# Patient Record
Sex: Male | Born: 1935 | Race: White | Hispanic: No | Smoking: Former smoker
Health system: Southern US, Community
[De-identification: ages and names within clinical notes are randomized; demographics above are authoritative.]

## PROBLEM LIST (undated history)

## (undated) DIAGNOSIS — M4800 Spinal stenosis, site unspecified: Secondary | ICD-10-CM

## (undated) DIAGNOSIS — K5792 Diverticulitis of intestine, part unspecified, without perforation or abscess without bleeding: Secondary | ICD-10-CM

## (undated) DIAGNOSIS — M519 Unspecified thoracic, thoracolumbar and lumbosacral intervertebral disc disorder: Secondary | ICD-10-CM

## (undated) DIAGNOSIS — M169 Osteoarthritis of hip, unspecified: Secondary | ICD-10-CM

## (undated) DIAGNOSIS — I4891 Unspecified atrial fibrillation: Secondary | ICD-10-CM

## (undated) DIAGNOSIS — L84 Corns and callosities: Secondary | ICD-10-CM

## (undated) DIAGNOSIS — M62838 Other muscle spasm: Secondary | ICD-10-CM

## (undated) DIAGNOSIS — R269 Unspecified abnormalities of gait and mobility: Secondary | ICD-10-CM

## (undated) DIAGNOSIS — N400 Enlarged prostate without lower urinary tract symptoms: Secondary | ICD-10-CM

## (undated) DIAGNOSIS — I872 Venous insufficiency (chronic) (peripheral): Secondary | ICD-10-CM

## (undated) DIAGNOSIS — M25579 Pain in unspecified ankle and joints of unspecified foot: Secondary | ICD-10-CM

## (undated) DIAGNOSIS — I509 Heart failure, unspecified: Secondary | ICD-10-CM

## (undated) DIAGNOSIS — I309 Acute pericarditis, unspecified: Secondary | ICD-10-CM

## (undated) DIAGNOSIS — I10 Essential (primary) hypertension: Secondary | ICD-10-CM

## (undated) DIAGNOSIS — H3530 Unspecified macular degeneration: Secondary | ICD-10-CM

## (undated) DIAGNOSIS — R5381 Other malaise: Secondary | ICD-10-CM

## (undated) DIAGNOSIS — I2510 Atherosclerotic heart disease of native coronary artery without angina pectoris: Secondary | ICD-10-CM

## (undated) DIAGNOSIS — D649 Anemia, unspecified: Secondary | ICD-10-CM

## (undated) DIAGNOSIS — Z5189 Encounter for other specified aftercare: Secondary | ICD-10-CM

## (undated) DIAGNOSIS — Z8673 Personal history of transient ischemic attack (TIA), and cerebral infarction without residual deficits: Secondary | ICD-10-CM

## (undated) DIAGNOSIS — I219 Acute myocardial infarction, unspecified: Secondary | ICD-10-CM

## (undated) DIAGNOSIS — J189 Pneumonia, unspecified organism: Secondary | ICD-10-CM

## (undated) DIAGNOSIS — I252 Old myocardial infarction: Secondary | ICD-10-CM

## (undated) DIAGNOSIS — I5021 Acute systolic (congestive) heart failure: Secondary | ICD-10-CM

## (undated) DIAGNOSIS — S32509A Unspecified fracture of unspecified pubis, initial encounter for closed fracture: Secondary | ICD-10-CM

## (undated) DIAGNOSIS — R0609 Other forms of dyspnea: Secondary | ICD-10-CM

## (undated) DIAGNOSIS — IMO0001 Reserved for inherently not codable concepts without codable children: Secondary | ICD-10-CM

## (undated) DIAGNOSIS — K269 Duodenal ulcer, unspecified as acute or chronic, without hemorrhage or perforation: Secondary | ICD-10-CM

## (undated) DIAGNOSIS — M1990 Unspecified osteoarthritis, unspecified site: Secondary | ICD-10-CM

## (undated) DIAGNOSIS — H259 Unspecified age-related cataract: Secondary | ICD-10-CM

## (undated) DIAGNOSIS — J449 Chronic obstructive pulmonary disease, unspecified: Secondary | ICD-10-CM

## (undated) DIAGNOSIS — K5900 Constipation, unspecified: Secondary | ICD-10-CM

## (undated) DIAGNOSIS — G609 Hereditary and idiopathic neuropathy, unspecified: Secondary | ICD-10-CM

## (undated) DIAGNOSIS — I498 Other specified cardiac arrhythmias: Secondary | ICD-10-CM

## (undated) DIAGNOSIS — I5032 Chronic diastolic (congestive) heart failure: Secondary | ICD-10-CM

## (undated) DIAGNOSIS — A0100 Typhoid fever, unspecified: Secondary | ICD-10-CM

## (undated) DIAGNOSIS — R0989 Other specified symptoms and signs involving the circulatory and respiratory systems: Secondary | ICD-10-CM

## (undated) DIAGNOSIS — Z8719 Personal history of other diseases of the digestive system: Secondary | ICD-10-CM

## (undated) HISTORY — DX: Unspecified macular degeneration: H35.30

## (undated) HISTORY — DX: Hereditary and idiopathic neuropathy, unspecified: G60.9

## (undated) HISTORY — DX: Heart failure, unspecified: I50.9

## (undated) HISTORY — DX: Other malaise: R53.81

## (undated) HISTORY — DX: Other specified symptoms and signs involving the circulatory and respiratory systems: R09.89

## (undated) HISTORY — DX: Pneumonia, unspecified organism: J18.9

## (undated) HISTORY — DX: Essential (primary) hypertension: I10

## (undated) HISTORY — PX: SPINE SURGERY: SHX786

## (undated) HISTORY — PX: APPENDECTOMY: SHX54

## (undated) HISTORY — DX: Unspecified atrial fibrillation: I48.91

## (undated) HISTORY — DX: Typhoid fever, unspecified: A01.00

## (undated) HISTORY — DX: Other specified cardiac arrhythmias: I49.8

## (undated) HISTORY — DX: Other forms of dyspnea: R06.09

## (undated) HISTORY — DX: Other muscle spasm: M62.838

## (undated) HISTORY — DX: Duodenal ulcer, unspecified as acute or chronic, without hemorrhage or perforation: K26.9

## (undated) HISTORY — DX: Acute pericarditis, unspecified: I30.9

## (undated) HISTORY — DX: Personal history of other diseases of the digestive system: Z87.19

## (undated) HISTORY — DX: Unspecified abnormalities of gait and mobility: R26.9

## (undated) HISTORY — PX: TONSILLECTOMY AND ADENOIDECTOMY: SHX28

## (undated) HISTORY — DX: Acute systolic (congestive) heart failure: I50.21

## (undated) HISTORY — DX: Unspecified fracture of unspecified pubis, initial encounter for closed fracture: S32.509A

## (undated) HISTORY — DX: Unspecified osteoarthritis, unspecified site: M19.90

## (undated) HISTORY — DX: Osteoarthritis of hip, unspecified: M16.9

## (undated) HISTORY — DX: Unspecified thoracic, thoracolumbar and lumbosacral intervertebral disc disorder: M51.9

## (undated) HISTORY — PX: CHOLECYSTECTOMY: SHX55

## (undated) HISTORY — DX: Corns and callosities: L84

## (undated) HISTORY — DX: Unspecified age-related cataract: H25.9

## (undated) HISTORY — DX: Reserved for inherently not codable concepts without codable children: IMO0001

## (undated) HISTORY — DX: Venous insufficiency (chronic) (peripheral): I87.2

## (undated) HISTORY — DX: Chronic diastolic (congestive) heart failure: I50.32

## (undated) HISTORY — DX: Anemia, unspecified: D64.9

## (undated) HISTORY — DX: Pain in unspecified ankle and joints of unspecified foot: M25.579

## (undated) HISTORY — DX: Encounter for other specified aftercare: Z51.89

## (undated) HISTORY — DX: Atherosclerotic heart disease of native coronary artery without angina pectoris: I25.10

## (undated) HISTORY — DX: Acute myocardial infarction, unspecified: I21.9

## (undated) HISTORY — DX: Constipation, unspecified: K59.00

## (undated) HISTORY — DX: Personal history of transient ischemic attack (TIA), and cerebral infarction without residual deficits: Z86.73

## (undated) HISTORY — DX: Chronic obstructive pulmonary disease, unspecified: J44.9

---

## 1974-05-20 DIAGNOSIS — I219 Acute myocardial infarction, unspecified: Secondary | ICD-10-CM

## 1974-05-20 DIAGNOSIS — I252 Old myocardial infarction: Secondary | ICD-10-CM

## 1974-05-20 HISTORY — DX: Old myocardial infarction: I25.2

## 1974-05-20 HISTORY — DX: Acute myocardial infarction, unspecified: I21.9

## 1997-10-03 ENCOUNTER — Emergency Department (HOSPITAL_COMMUNITY): Admission: EM | Admit: 1997-10-03 | Discharge: 1997-10-03 | Payer: Self-pay | Admitting: Emergency Medicine

## 1997-10-14 ENCOUNTER — Other Ambulatory Visit: Admission: RE | Admit: 1997-10-14 | Discharge: 1997-10-14 | Payer: Self-pay | Admitting: Gastroenterology

## 2000-05-20 HISTORY — PX: TOTAL HIP ARTHROPLASTY: SHX124

## 2000-06-23 ENCOUNTER — Encounter: Payer: Self-pay | Admitting: Orthopedic Surgery

## 2000-06-30 ENCOUNTER — Encounter: Payer: Self-pay | Admitting: Orthopedic Surgery

## 2000-06-30 ENCOUNTER — Inpatient Hospital Stay (HOSPITAL_COMMUNITY): Admission: RE | Admit: 2000-06-30 | Discharge: 2000-07-04 | Payer: Self-pay | Admitting: Orthopedic Surgery

## 2000-07-04 ENCOUNTER — Encounter: Payer: Self-pay | Admitting: Orthopedic Surgery

## 2001-05-20 HISTORY — PX: INGUINAL HERNIA REPAIR: SUR1180

## 2001-06-30 ENCOUNTER — Ambulatory Visit (HOSPITAL_BASED_OUTPATIENT_CLINIC_OR_DEPARTMENT_OTHER): Admission: RE | Admit: 2001-06-30 | Discharge: 2001-06-30 | Payer: Self-pay | Admitting: *Deleted

## 2001-11-02 ENCOUNTER — Encounter: Admission: RE | Admit: 2001-11-02 | Discharge: 2001-11-02 | Payer: Self-pay | Admitting: Urology

## 2001-11-02 ENCOUNTER — Encounter: Payer: Self-pay | Admitting: Urology

## 2001-11-10 ENCOUNTER — Encounter: Payer: Self-pay | Admitting: Urology

## 2001-11-10 ENCOUNTER — Ambulatory Visit (HOSPITAL_BASED_OUTPATIENT_CLINIC_OR_DEPARTMENT_OTHER): Admission: RE | Admit: 2001-11-10 | Discharge: 2001-11-10 | Payer: Self-pay | Admitting: Urology

## 2001-12-16 ENCOUNTER — Inpatient Hospital Stay (HOSPITAL_COMMUNITY): Admission: EM | Admit: 2001-12-16 | Discharge: 2001-12-19 | Payer: Self-pay | Admitting: Emergency Medicine

## 2001-12-16 ENCOUNTER — Encounter: Payer: Self-pay | Admitting: Emergency Medicine

## 2002-04-12 ENCOUNTER — Inpatient Hospital Stay (HOSPITAL_COMMUNITY): Admission: EM | Admit: 2002-04-12 | Discharge: 2002-04-17 | Payer: Self-pay | Admitting: Gastroenterology

## 2005-05-20 HISTORY — PX: VEIN SURGERY: SHX48

## 2005-09-17 ENCOUNTER — Encounter: Admission: RE | Admit: 2005-09-17 | Discharge: 2005-10-16 | Payer: Self-pay | Admitting: Cardiology

## 2005-10-17 ENCOUNTER — Encounter: Admission: RE | Admit: 2005-10-17 | Discharge: 2005-10-17 | Payer: Self-pay | Admitting: Cardiology

## 2005-10-17 ENCOUNTER — Encounter: Admission: RE | Admit: 2005-10-17 | Discharge: 2005-11-22 | Payer: Self-pay | Admitting: Cardiology

## 2006-09-02 ENCOUNTER — Encounter: Admission: RE | Admit: 2006-09-02 | Discharge: 2006-09-02 | Payer: Self-pay | Admitting: Cardiology

## 2007-06-01 ENCOUNTER — Encounter: Admission: RE | Admit: 2007-06-01 | Discharge: 2007-06-01 | Payer: Self-pay | Admitting: Sports Medicine

## 2007-06-12 ENCOUNTER — Inpatient Hospital Stay (HOSPITAL_COMMUNITY): Admission: RE | Admit: 2007-06-12 | Discharge: 2007-06-15 | Payer: Self-pay | Admitting: Orthopedic Surgery

## 2007-06-19 ENCOUNTER — Inpatient Hospital Stay (HOSPITAL_COMMUNITY): Admission: EM | Admit: 2007-06-19 | Discharge: 2007-06-22 | Payer: Self-pay | Admitting: Emergency Medicine

## 2007-08-31 ENCOUNTER — Emergency Department (HOSPITAL_COMMUNITY): Admission: EM | Admit: 2007-08-31 | Discharge: 2007-08-31 | Payer: Self-pay | Admitting: Emergency Medicine

## 2008-03-25 ENCOUNTER — Ambulatory Visit: Payer: Self-pay | Admitting: Vascular Surgery

## 2008-03-28 ENCOUNTER — Ambulatory Visit: Payer: Self-pay | Admitting: Vascular Surgery

## 2008-04-04 ENCOUNTER — Ambulatory Visit: Payer: Self-pay | Admitting: Vascular Surgery

## 2008-04-05 ENCOUNTER — Ambulatory Visit: Payer: Self-pay | Admitting: Vascular Surgery

## 2008-04-11 ENCOUNTER — Ambulatory Visit: Payer: Self-pay | Admitting: Vascular Surgery

## 2008-04-18 ENCOUNTER — Ambulatory Visit: Payer: Self-pay | Admitting: Vascular Surgery

## 2008-04-25 ENCOUNTER — Ambulatory Visit: Payer: Self-pay | Admitting: Vascular Surgery

## 2008-05-02 ENCOUNTER — Ambulatory Visit: Payer: Self-pay | Admitting: Vascular Surgery

## 2008-05-10 ENCOUNTER — Ambulatory Visit: Payer: Self-pay | Admitting: Vascular Surgery

## 2008-05-16 ENCOUNTER — Ambulatory Visit: Payer: Self-pay | Admitting: Vascular Surgery

## 2008-05-23 ENCOUNTER — Ambulatory Visit: Payer: Self-pay | Admitting: Vascular Surgery

## 2008-05-30 ENCOUNTER — Ambulatory Visit: Payer: Self-pay | Admitting: Vascular Surgery

## 2008-06-06 ENCOUNTER — Ambulatory Visit: Payer: Self-pay | Admitting: Vascular Surgery

## 2008-07-26 ENCOUNTER — Ambulatory Visit: Payer: Self-pay | Admitting: Vascular Surgery

## 2008-08-02 ENCOUNTER — Ambulatory Visit: Payer: Self-pay | Admitting: Vascular Surgery

## 2008-08-08 ENCOUNTER — Ambulatory Visit: Payer: Self-pay | Admitting: Vascular Surgery

## 2008-08-15 ENCOUNTER — Ambulatory Visit: Payer: Self-pay | Admitting: Vascular Surgery

## 2009-01-17 ENCOUNTER — Ambulatory Visit: Payer: Self-pay | Admitting: Vascular Surgery

## 2009-03-16 IMAGING — CR DG LUMBAR SPINE 2-3V
2 series · 2 of 2 positions shown · non-contrast
Comparison: none

CLINICAL DATA: LUMBAR SPINE ? 2 VIEW:

[t l-spine a.p.]
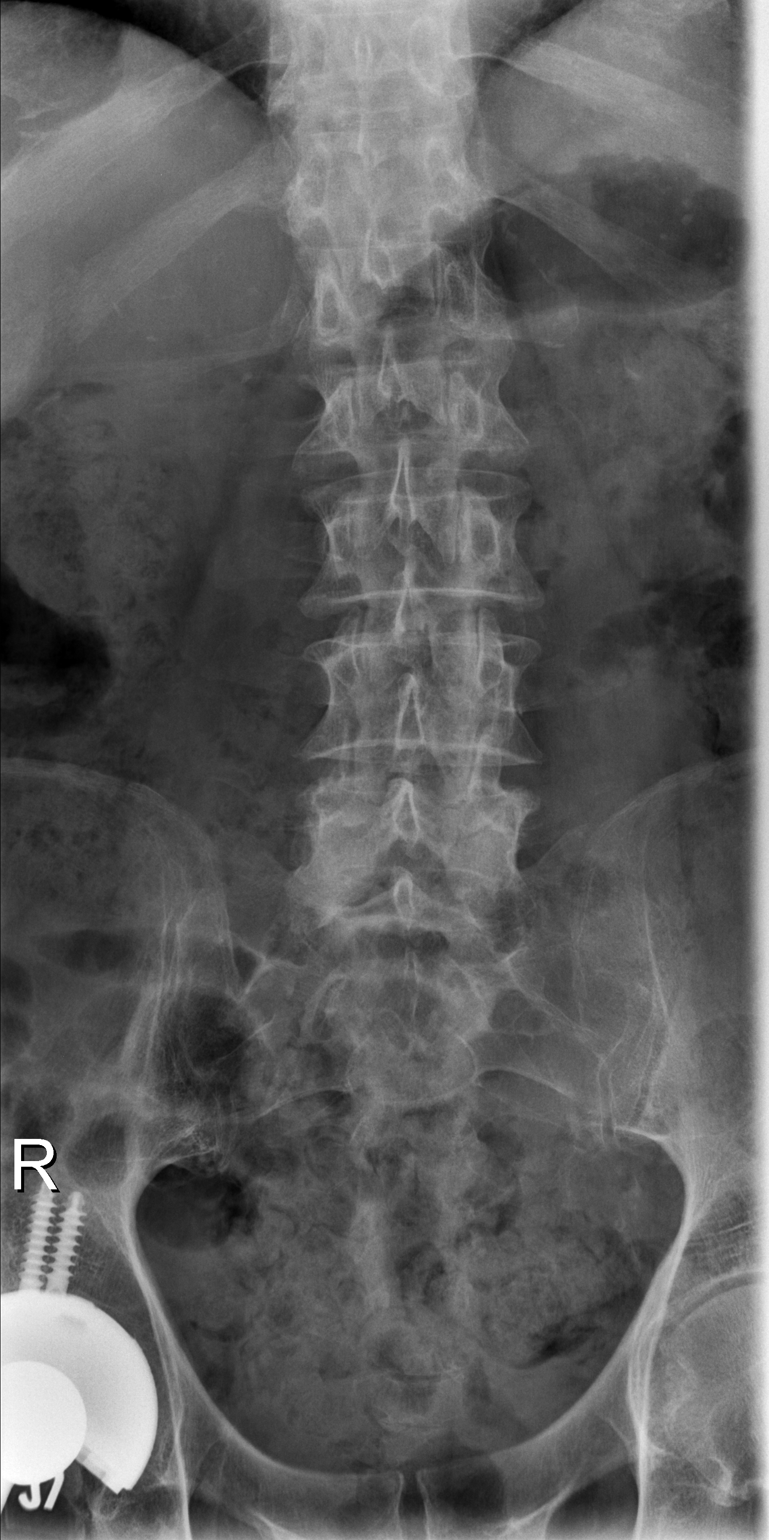

[t l-spine lat]
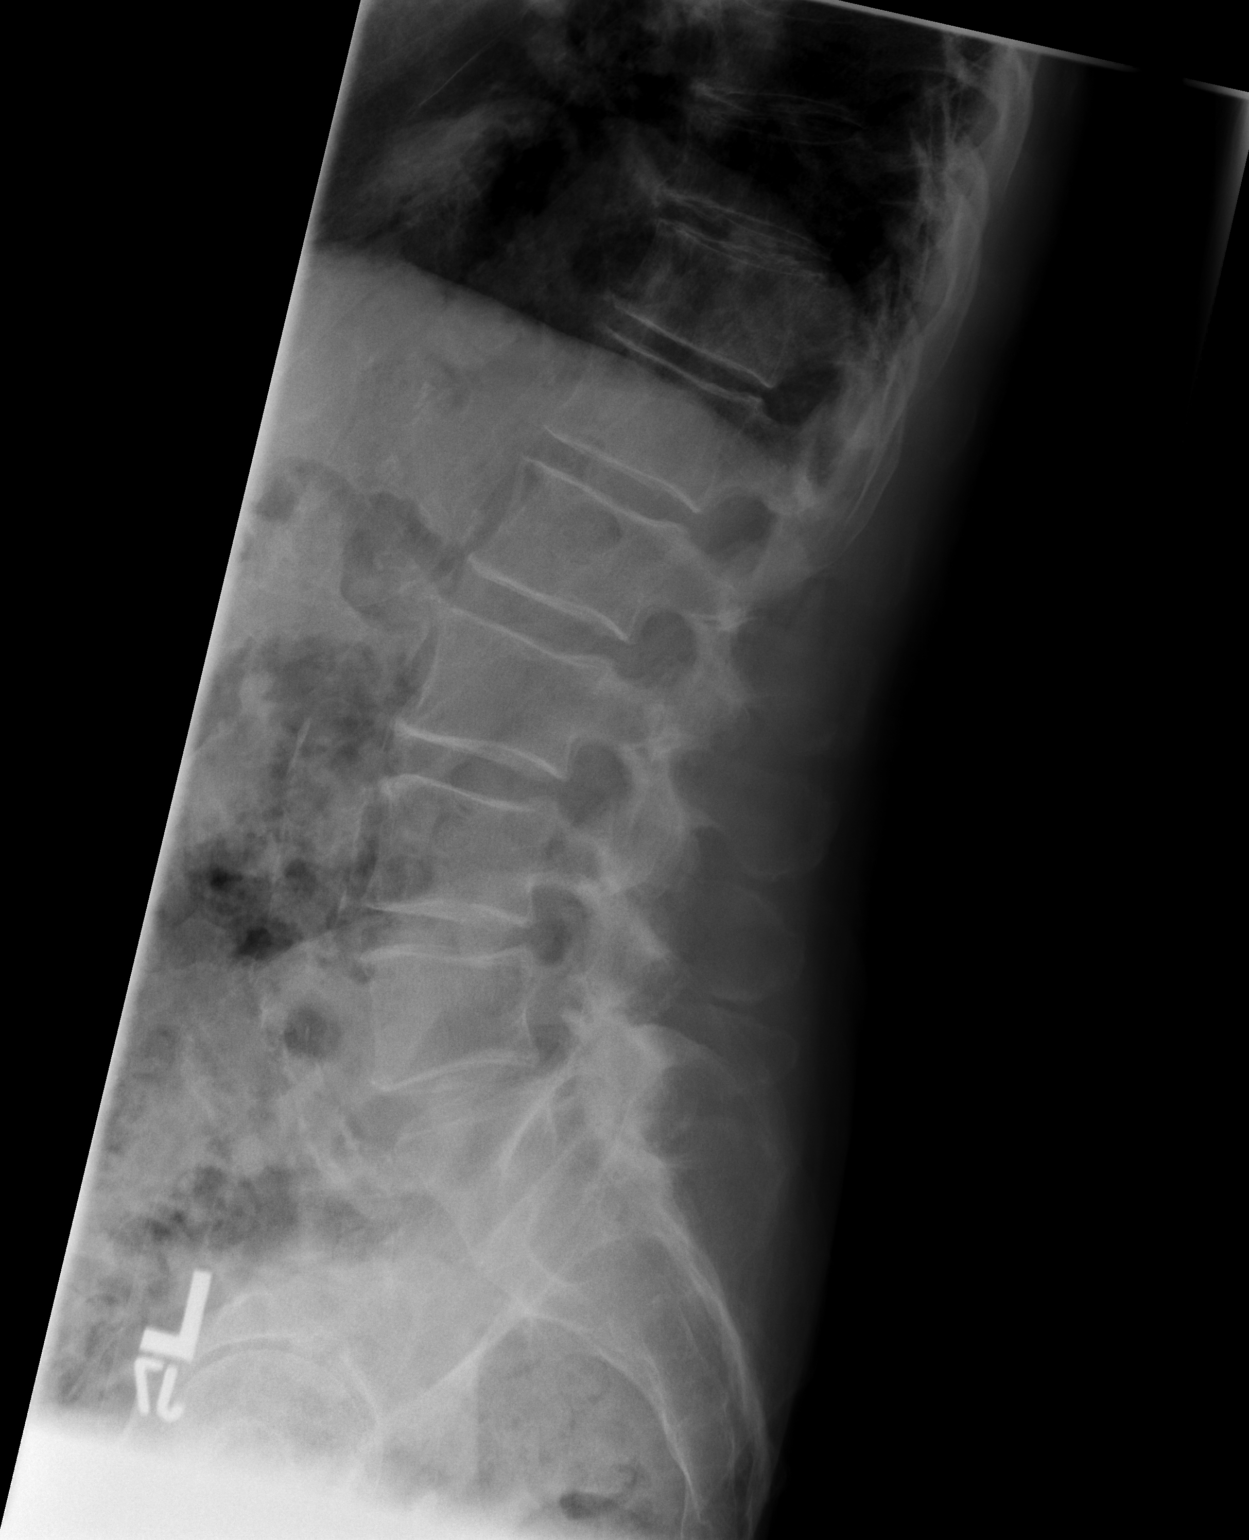

[2 of 2 positions shown; findings below may reference images not displayed]

FINDINGS: Patient has a diminutive right rib off the first lumbar type vertebral body as was seen on CT lumbar spine 06/01/07.  There is mild convex left scoliosis.   Facet degenerative change lower lumbar spine noted.
IMPRESSION: Diminutive right rib off the first lumbar type vertebral body.  Numbering scheme is the same as that employed on lumbar spine CT scan on 06/01/07.

## 2009-03-18 IMAGING — CR DG LUMBAR SPINE 2-3V
3 series · 3 of 3 positions shown · non-contrast
Comparison: Lumbar spine x-rays 06/10/2007 and lumbar CT myelogram 06/01/2007.

CLINICAL DATA: L4-L5 and L5-S1 disc herniation.

INTRAOPERATIVE LUMBAR SPINE 3 VIEW 06/12/2007:

[view not recorded (1 of 3)]
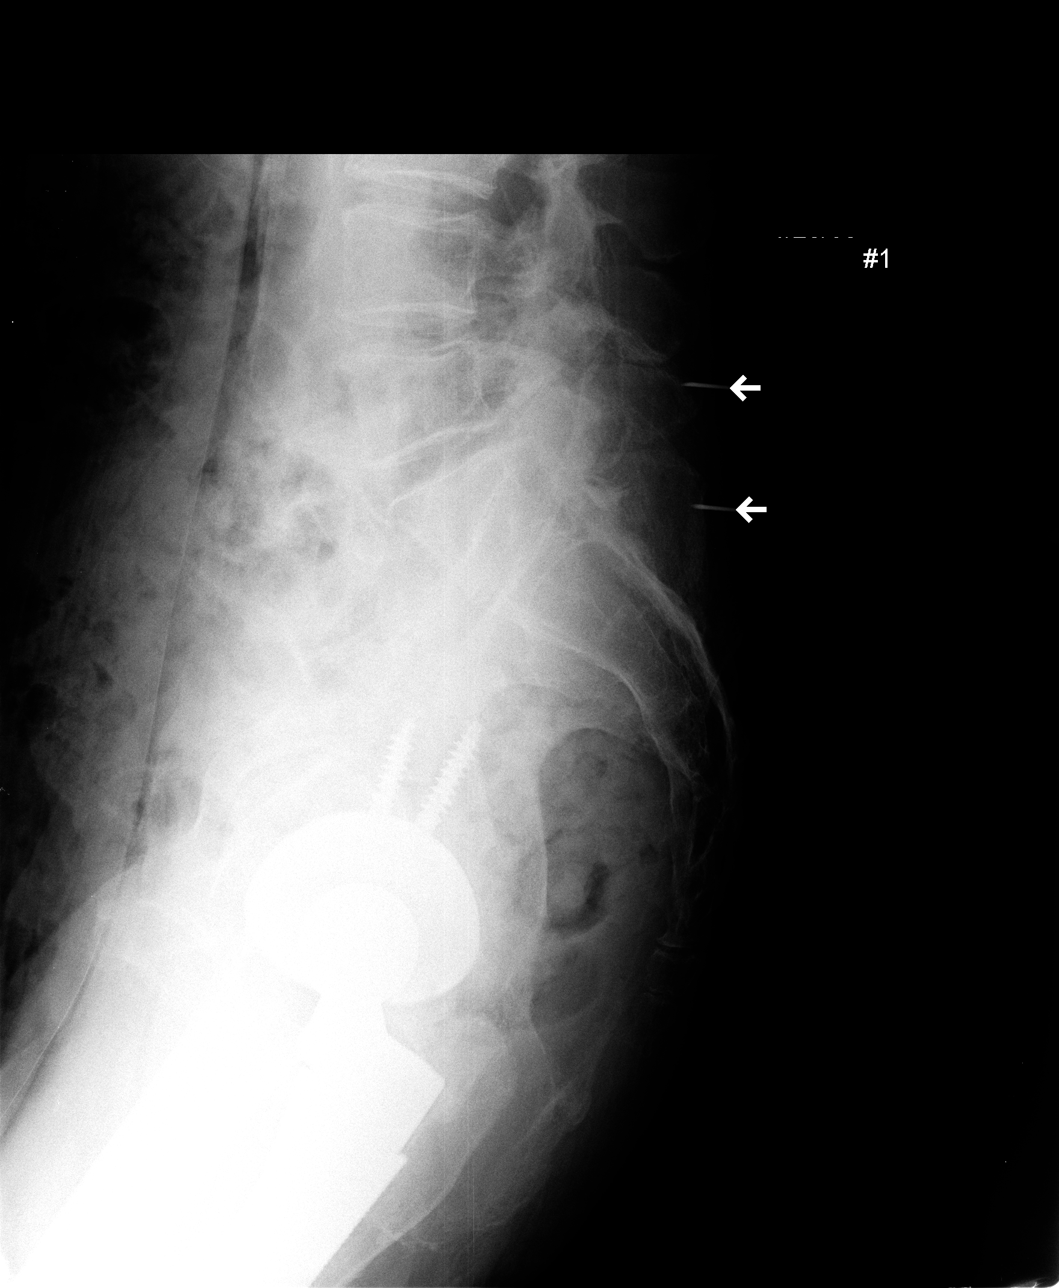

[view not recorded (2 of 3)]
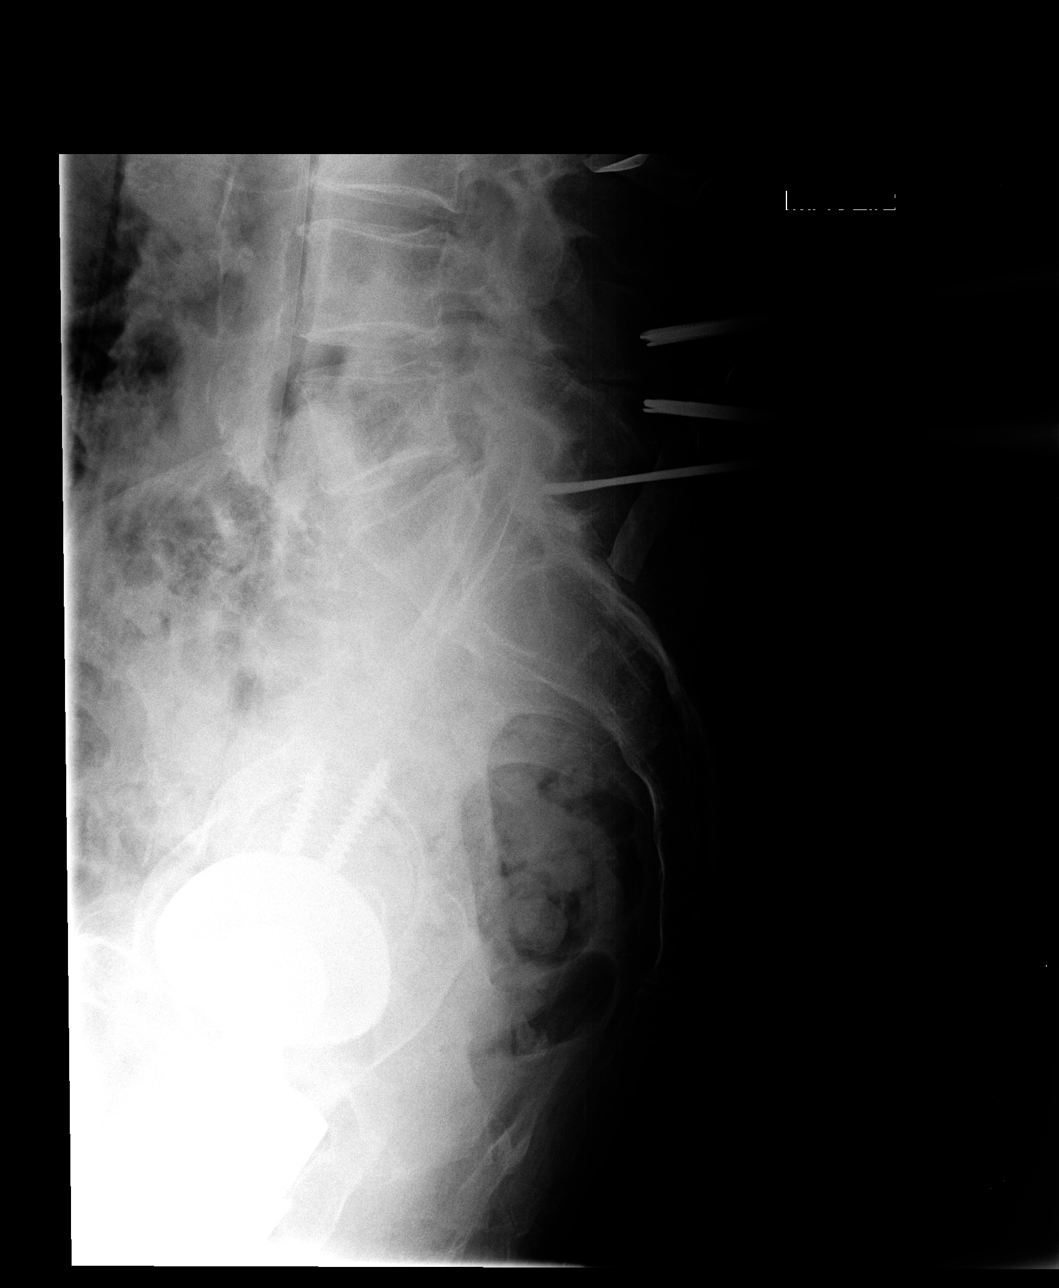

[view not recorded (3 of 3)]
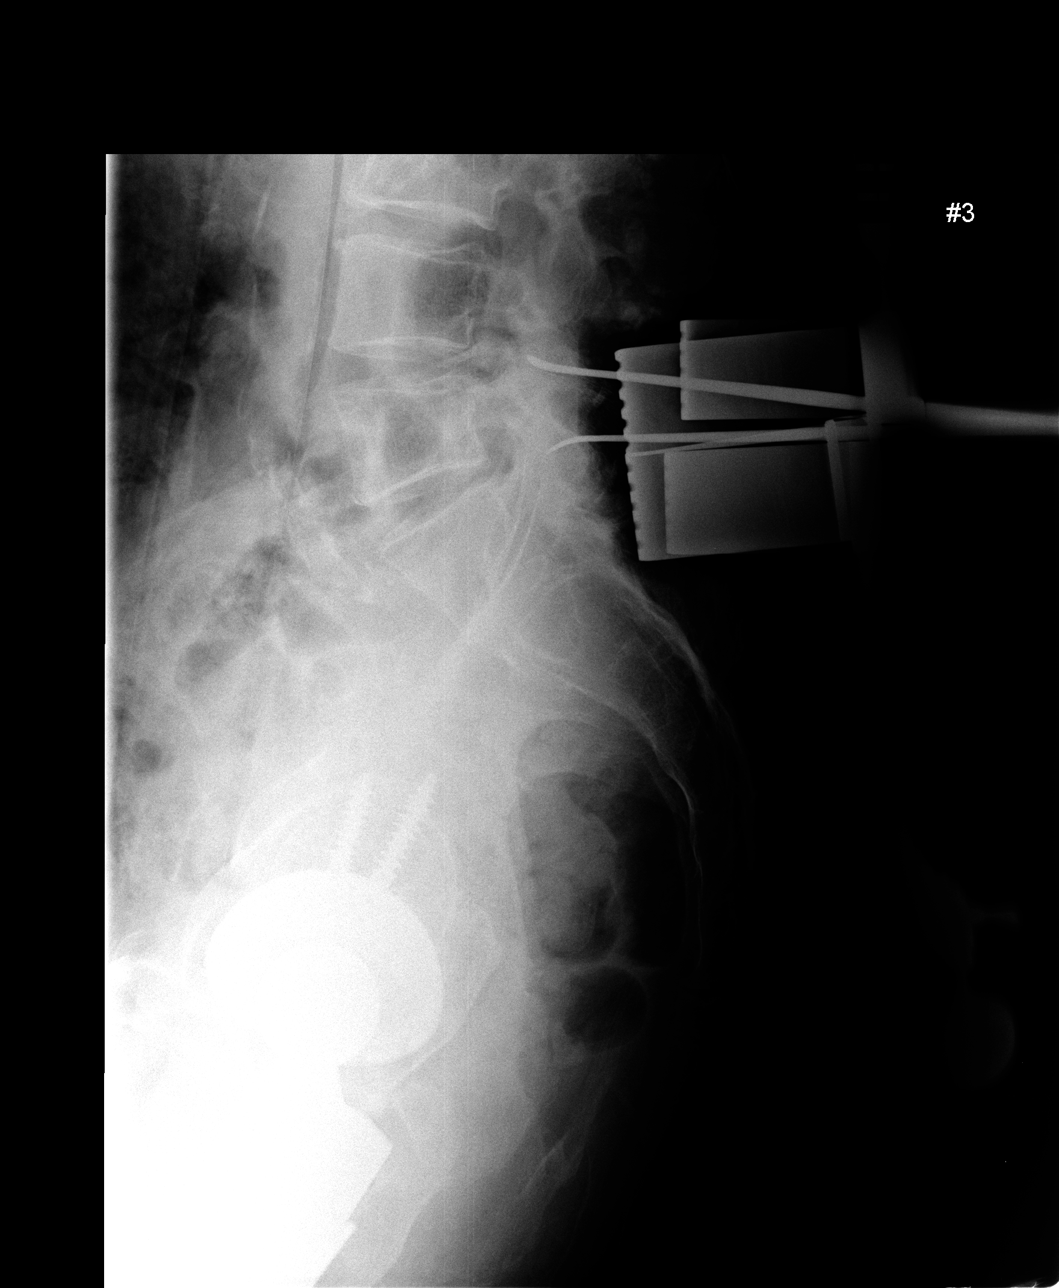

[3 of 3 positions shown; findings below may reference images not displayed]

FINDINGS: Using the same numbering scheme as on the previous examinations,
initial image obtained at 8166 hours and submitted for interpretation
post-operatively demonstrates localizer needles adjacent to the spinous
processes of L5 and transitional S1. Second image obtained at 6585 hours
demonstrates clamps on the spinous processes of L4 and L5 with a localizing
device posterior to the transitional S1 segment. Third image obtained at 2988
hours demonstrates device is directed toward the L4-L5 and L5-S1 disc spaces.
IMPRESSION: Intraoperative localization of L4-L5 and L5-S1, using the same numbering scheme
as previous examinations.

## 2009-04-03 ENCOUNTER — Ambulatory Visit: Payer: Self-pay | Admitting: Vascular Surgery

## 2009-09-13 ENCOUNTER — Encounter: Admission: RE | Admit: 2009-09-13 | Discharge: 2009-09-13 | Payer: Self-pay | Admitting: Cardiology

## 2010-02-22 ENCOUNTER — Ambulatory Visit: Payer: Self-pay | Admitting: Cardiology

## 2010-06-25 ENCOUNTER — Ambulatory Visit (INDEPENDENT_AMBULATORY_CARE_PROVIDER_SITE_OTHER): Payer: Medicare Other | Admitting: Vascular Surgery

## 2010-06-25 DIAGNOSIS — I83893 Varicose veins of bilateral lower extremities with other complications: Secondary | ICD-10-CM

## 2010-06-25 DIAGNOSIS — M79609 Pain in unspecified limb: Secondary | ICD-10-CM

## 2010-06-26 ENCOUNTER — Encounter: Payer: Self-pay | Admitting: Cardiology

## 2010-06-26 ENCOUNTER — Ambulatory Visit (INDEPENDENT_AMBULATORY_CARE_PROVIDER_SITE_OTHER): Payer: Medicare Other | Admitting: Cardiology

## 2010-06-26 DIAGNOSIS — I2510 Atherosclerotic heart disease of native coronary artery without angina pectoris: Secondary | ICD-10-CM

## 2010-06-26 DIAGNOSIS — Z79899 Other long term (current) drug therapy: Secondary | ICD-10-CM

## 2010-06-26 DIAGNOSIS — I4891 Unspecified atrial fibrillation: Secondary | ICD-10-CM

## 2010-06-26 DIAGNOSIS — I119 Hypertensive heart disease without heart failure: Secondary | ICD-10-CM

## 2010-06-29 NOTE — Assessment & Plan Note (Signed)
OFFICE VISIT  [name], [name] DOB:  [birth year]                                       06/25/2010 HYQMV#:[id number]  The patient returns today concerned about the status of his venous and arterial disease in his lower extremities.  He has been awakened at night on a few occasions with some discomfort in the very lower part of his left foot near the base of his first toe.  This usually responds to massage and getting out of bed.  He does not have severe claudication during the day and has had no recurrent stasis ulcers, bleeding from varicosities, swelling in the legs or other problems.  He states his legs feel much better than prior to his bilateral ablation procedures 2 and 4 years ago.  He has had no thrombophlebitis or DVT.  CHRONIC MEDICAL PROBLEMS: 1. Hypertension. 2. Coronary artery disease, previous myocardial infarction. 3. Degenerative joint disease, previous lumbar fusion a few years ago     followed by a pelvic fracture with fall. 4. Negative for diabetes, hyperlipidemia, COPD or stroke.  SOCIAL HISTORY:  He is widowed, has 3 children.  Has not smoked since 1976.  Drinks occasional alcohol.  FAMILY HISTORY:  Positive for coronary artery disease in his father. Who died of a myocardial infarction.  REVIEW OF SYSTEMS:  Negative for chest pain, dyspnea on exertion.  Does have arthritis in multiple joints, urinary frequency.  Denies all other systems in a complete review of systems.  PHYSICAL EXAMINATION:  Blood pressure 149/83, heart rate is 81, respirations 20.  Generally, he is an elderly male.  He is in no apparent distress, alert and oriented x3.  HEENT:  Exam normal for age. EOMs intact.  Lungs:  Clear to auscultation.  No rhonchi or wheezing. Cardiovascular:  Regular rhythm, no murmurs.  Carotid pulses 3+, no bruits.  Abdomen:  Soft, nontender, with no masses.  Musculoskeletal: Free of major deformities.  The lower extremity exam  reveals 3+ femoral, popliteal, and 1 to 2+ dorsalis pedis pulses bilaterally.  He has hyperpigmentation in both lower extremities from the midcalf distally with scaly skin but no edema and no ulcerations noted.  No varicosities are noted.  Today I ordered lower extremity venous duplex exam in both legs, which I reviewed and interpreted.  He has no DVT in either leg.  He does have severe reflux in the deep system of both legs.  GSV on the left is totally occluded, on the right is not adequately visualized and it has been previously closed.  I do not think his problems are due to venous insufficiency, although does have some severe deep reflux.  He is wearing elastic compression stockings daily.  I have reassured regarding his situation and his vasculature, and he will return to see [place] on a p.r.n. basis.    [name], M.D. Electronically Signed  JDL/MEDQ  D:  06/25/2010  T:  06/26/2010  Job:  [id number]

## 2010-07-02 NOTE — Procedures (Unsigned)
DUPLEX DEEP VENOUS EXAM - LOWER EXTREMITY  INDICATION:  Bilateral greater saphenous vein ablations, complaint of left foot cramping  HISTORY:  Edema:  Known chronic bilateral lower extremity swelling  with current use of compression hose Trauma/Surgery:  Left greater saphenous vein laser ablation on 12/23/2005, right greater saphenous vein laser ablation on 08/08/2008. Pain:  Left foot cramping at night which is alleviated with walking PE:  no Previous DVT:  no Anticoagulants: Other:  DUPLEX EXAM:               CFV   SFV   PopV  PTV    GSV               R  L  R  L  R  L  R   L  R  L Thrombosis    0  o  o  o  o  o  o   0     + Spontaneous   +  +  +  +  +  +  +   +     0 Phasic        +  +  +  +  +  +  +   +     0 Augmentation  +  +  +  +  +  +  +   +     0 Compressible  +  +  +  +  +  +  +   +     0 Competent     0  0  0  0  0  0  0   0     0  Legend:  + - yes  o - no  p - partial  D - decreased  IMPRESSION: 1. No evidence of deep venous thrombosis noted in the bilateral lower     extremities.  Severe reflux of greater than 500 milliseconds noted     throughout the bilateral deep venous systems. 2. The left greater saphenous vein appears totally occluded from the     knee to the groin area.  The right greater saphenous vein was not     adequately visualized in the thigh level. 3. There is incompetent perforator noted at the left medial distal     calf level measuring 0.47 cm.   _____________________________ [name]. Hart [place], M.D.  CH/MEDQ  D:  06/26/2010  T:  06/26/2010  Job:  [id number]

## 2010-10-02 NOTE — Assessment & Plan Note (Signed)
OFFICE VISIT   [name], [name]  DOB:  Jan 28, 1912                                       03/28/2008  WJXBJ#:[id number]   The patient returns having undergone laser ablation to his left greater  saphenous vein with stab phlebectomies on December 23, 2005.  He has  developed an ulceration on the lateral aspect of his lower leg on the  left about 2 months ago, which has not responded to conservative  treatment.  He was referred for further evaluation by Dr. Worthy Rancher.  He has not been wearing any elastic compression stockings but has been  trying local wound care.  He states that the discomfort he is having  associated with the varicosities prior to the procedure has resolved and  he is pleased with that.   EXAMINATION:  Blood pressure 180/77, heart rate 60, respirations 12.  Lower extremity exam reveals 3+ femoral, popliteal, and 2+ dorsalis  pedis pulses bilaterally.  The left leg has significant  hyperpigmentation from the mid calf distally, consistent with chronic  venous insufficiency.  There are no large bulbous varicosities noted.  He has an ulcer in the lateral aspect of the lower leg measuring about 3  x 1.5 cm which does not appear infected.  The foot is adequately  perfused.   Venous duplex exam is performed and reveals complete occlusion of the  left greater saphenous vein consistent with previous laser ablation 2  years ago.  His deep venous system is widely patent, except incompetence  in the left popliteal vein.   I do not think he has significant arterial insufficiency causing this  and suspect he continues to have some venous hypertension, although the  ablation of the left greater saphenous vein was successful with closure  of the vein.  We will treat this with Unna boot changes once a week and  I will see him in 4 to 5 weeks to see how he is progressing.   [name], M.D.  Electronically Signed   JDL/MEDQ  D:  03/28/2008  T:   03/29/2008  Job:  [id number]

## 2010-10-02 NOTE — Assessment & Plan Note (Signed)
OFFICE VISIT   TREMAIN, RUCINSKI [name]  DOB:  11/11/1911                                       05/10/2008  WJXBJ#:[id number]   The patient returns today after having weekly Unnaboot changes for his  stasis ulcer in the left lateral leg.  He was last seen by me November  23 at which time the ulcer measured 3 x 1 cm.  Today it is almost  completely healed.  There are two eschars at the superior and inferior  aspect of the previous ulcer with skin growing in-between these.  He had  no significant distal edema today.  His left leg continues well-  perfused.  I think we should continue Unnaboot for at least another 3-4  weeks to get this completely healed.  He will return to see me in 4  weeks and possibly we will then convert him to short leg elastic  stockings.   [name], M.D.  Electronically Signed   JDL/MEDQ  D:  05/10/2008  T:  05/11/2008  Job:  [id number]

## 2010-10-02 NOTE — Op Note (Signed)
NAME:  [name], RESSEL NO.:  [id number]   MEDICAL RECORD NO.:  [id number]          PATIENT TYPE:  INP   LOCATION:  0002                         FACILITY:  [facility]   PHYSICIAN:  Georges Lynch. Gioffre, M.D.DATE OF BIRTH:  [birth year]   DATE OF PROCEDURE:  06/12/2007  DATE OF DISCHARGE:                               OPERATIVE REPORT   PREOPERATIVE DIAGNOSES:  1. He is 77 years old and he has severe left leg pain.  Note his      myelogram revealed he has spinal stenosis at L4-L5.  2. He had a severe spinal stenosis at L5-S1.  3. Had a large herniated disk at L5-S1 on the left.   POSTOPERATIVE DIAGNOSES:  1. He is 77 years old and he has severe left leg pain.  Note his      myelogram revealed he has spinal stenosis at L4-L5.  2. He had a severe spinal stenosis at L5-S1.  3. Had a large herniated disk at L5-S1 on the left.   OPERATION:  1. Complete decompressive lumbar laminectomy at L4-L5 with      foraminotomies at both sides.  2. Complete decompressive lumbar laminectomy at L5-S1 with      foraminotomies bilaterally.  3. Microdiskectomy at L5-S1 on the left.   SURGEON:  Georges Lynch. Darrelyn Hillock, M.D.   ASSISTANT:  Marlowe Kays, M.D.   PROCEDURE IN DETAIL:  Under general anesthesia with the patient on a  Wilson frame, a routine orthopedic prep and drape of his back was  carried out.  He had 1 gram of IV Ancef preop.  At this time two needles  were placed in the back for localization purposes.  X-ray was taken.  At  this time an incision was made over the posterior aspect of the lumbar  spine, bleeders identified and cauterized.  The muscle was stripped from  the lamina and spinous processes bilaterally at both levels.  Another x-  ray was taken to verify our position.  Following that two [facility]  retractors were inserted.  We then went down and did a central  decompressive lumbar laminectomy at L4 and L5 in the usual fashion.  The  microscope was brought in to  remove the ligamentum flavum.  Note the  spinal canal was extremely tight at both levels.  We gradually and  carefully dissected the ligamentum flavum free of the dura.  We  protected the dura with cottonoids and also we utilized the microscope.  We first freed up the dura and then removed the ligamentum flavum.  We  did this at both levels bilaterally.  Once this was done we then went  down and did foraminotomies as I mentioned.  Following that we noted a  large defect impinging the left side of the dura at L5 and S1.  We  carefully did a partial facetectomy to remove that defect.  Once that  was done we identified the S1 root on the left.  We gently retracted the  root and went down and saw a large herniated disk.  We cauterized the  lateral veins.  We then went  down and made a cruciate incision in the  disk space and disk material extruded under pressure.  We then completed  the diskectomy.  I utilized a nerve hook and the Epstein curettes to go  subligamentous to make sure we had all the disk removed.  We also went  out laterally.  Multiple passes were made in the disk space for our  diskectomy.  We thoroughly irrigated out the area.  The roots now were  free.  We then loosely applied some 10 mL of FloSeal.  I utilized some  thrombin soaked Gelfoam into the soft tissue areas above the dura.   Following that the wound was closed in layers in the usual fashion.  I  did leave a small deep portion of the wound open proximally and distally  for drainage purposes.  The subcu was closed with 0 Vicryl, skin with  metal staples and a sterile Neosporin dressing was applied.  Postop he  was placed on a monitored bed for safety's sake as he is 77 years of  age.           ______________________________  Georges Lynch. Darrelyn Hillock, M.D.     RAG/MEDQ  D:  06/12/2007  T:  06/13/2007  Job:  [id number]   cc:   Cassell Clement, M.D.  Fax: [phone]   Marlowe Kays, M.D.  Fax: [phone]

## 2010-10-02 NOTE — Discharge Summary (Signed)
NAME:  [name], [name] NO.:  [id number]   MEDICAL RECORD NO.:  [id number]          PATIENT TYPE:  INP   LOCATION:  1435                         FACILITY:  [facility]   PHYSICIAN:  Georges Lynch. Gioffre, M.D.DATE OF BIRTH:  12/28/1911   DATE OF ADMISSION:  06/12/2007  DATE OF DISCHARGE:                               DISCHARGE SUMMARY   Discharge summary for [facility].  He was taken to surgery on June 12, 2007 with the diagnosis of spinal stenosis at L4-5 and L5-S1.  A  second diagnosis would be herniated disk L5-S1 on the left.  His main  problem was severe pain in his left leg.  Postop he did well.  He was  seen the night of surgery and he felt that his leg pain was completely  absent.  He was seen by the cardiologist and he was placed on the  monitor floor because of his age, and also a history of hypertension.  Postop, he did extremely well.  His dressings were changed and the wound  looked fine.  His hemoglobin remained stable.  The only abnormality, his  sodium dropped to about 127.  He was managed with IV saline and his  hydrochlorothiazide was withheld.  I saw him again this morning, on  June 15, 2007.  Dressing was changed.  Wound looked good.  He is  moving his legs well.  He is up ambulating with a walker.  It was  elected to discharge him to the skilled nursing section at [facility].  His pertinent laboratory studies while in the hospital were stable.  His  chest x-ray basically showed no active disease.  The EKG showed that he  had a first-degree AV block with premature supraventricular complexes.  He had multiple laboratory studies in the hospital as well.  His initial  white count was 7100, hemoglobin 13, hematocrit 37, platelets were 280.  The initial sodium was 133, potassium 5.2, chloride 95, glucose 102, BUN  20, creatinine 1.14.  Liver function studies were within normal limits.  His INR was 1.  Prothrombin time was 13.1.  The urinalysis was  basically  negative.  Followup studies on him showed that his sodium remained somewhat low.  The last sodium done was on June 15, 2007, was 127.  So the plan was  to withhold his hydrochlorothiazide for least 1 week.   DISCHARGE DIAGNOSIS:  1. Hypertension.  2. Severe spinal stenosis.  3. Herniated lumbar disk L4-5 on the left.   DISCHARGE MEDICATIONS:  1. Colace 1 by mouth b.i.d. p.r.n.  2. Hydrochlorothiazide 25 mg a day.  We will hold that for 1 week and      then he will go to the reduced dose of 12.5 mg until seen by the      cardiologist.  3. Norvasc 5 mg a day.  4. Sinequan 25 mg hours asleep.  5. He will be on Percocet 10/650 one every 4 hours p.r.n. for pain.  6. Robaxin 500 mg by mouth b.i.d. p.r.n. for spasms.   DISCHARGE INSTRUCTIONS:  1. He will ambulate with a walker, full weightbearing.  2. The dressing should be changed once a day.  3. No tub baths, but he may shower.  4. He will see Dr. Darrelyn Hillock in the office.  Please call to make an      appointment at [phone], and he should be seen the first week in      February, which actually should be about 2 weeks from the day of      his surgery.           ______________________________  Georges Lynch Darrelyn Hillock, M.D.     RAG/MEDQ  D:  06/15/2007  T:  06/15/2007  Job:  [id number]   cc:   Elmore Guise., M.D.  Fax: [phone]

## 2010-10-02 NOTE — Consult Note (Signed)
NAME:  [name], [name] NO.:  [id number]   MEDICAL RECORD NO.:  [id number]          PATIENT TYPE:  INP   LOCATION:  0002                         FACILITY:  [facility]   PHYSICIAN:  Lyn Records, M.D.   DATE OF BIRTH:  13-May-1912   DATE OF CONSULTATION:  DATE OF DISCHARGE:                                 CONSULTATION   REASON FOR EVALUATION:  A 77 year old patient with right bundle branch  block who just underwent general anesthesia.   CONCLUSIONS:  1. Chronic right bundle branch block on EKG.  2. Status post decompressive lumbar surgery, June 12, 2007.  3. Hypertension.  4. Osteoarthritis.   RECOMMENDATIONS:  1. Continue usual medications as taken at home.  2. Check EKG in the a.m.  3. The patient is doing well at this early stage after noncardiac      surgery.  4. We will check on the patient again in the a.m.   COMMENTS:  The patient is 77 years of age and underwent a lumbar spinal  procedure today by Dr. Darrelyn Hillock.  He had no apparent complications or  hemodynamic derangements during surgery.  In reviewing the records, the  patient has a history of right bundle branch block.  There is also a  history of hypertension.  I found no records to suggest the possibility  or the prior history of atherosclerotic heart disease.  He does have a  history of hypertension.   MEDICATIONS:  His medications include antihypertensive therapy and/or  hydrochlorothiazide 25 mg q.a.m., Norvasc 5 mg q.a.m.  He also takes  Sinequan 25 mg at bedtime and oxycodone p.r.n. for pain.   PHYSICAL EXAMINATION:  GENERAL:  On exam, the patient is somewhat  groggy, is breathing adequately.  VITAL SIGNS:  Respirations are 12, blood pressure is 128/70, heart rate  is 70.  CARDIAC EXAM:  Unremarkable.  No JVD is noted.  LUNGS:  Clear anteriorly.  EXTREMITIES:  Reveal no edema.  No other data is available or needed at  this time.   COMMENTS:  The patient is stable at this early point  after noncardiac  surgery.  We should maintain his usual therapy.  Will check an EKG in  the a.m.  Will follow the patient with you.      Lyn Records, M.D.  Electronically Signed     HWS/MEDQ  D:  06/12/2007  T:  06/12/2007  Job:  [id number]   cc:   Windy Fast A. Darrelyn Hillock, M.D.  Fax: [phone]   Cassell Clement, M.D.  Fax: [phone]

## 2010-10-02 NOTE — Procedures (Signed)
DUPLEX DEEP VENOUS EXAM - LOWER EXTREMITY   INDICATION:  Follow up right greater saphenous vein laser ablation.   HISTORY:  Edema:  No  Trauma/Surgery:  Right greater saphenous vein laser ablation on  08/08/2008.  Pain:  Right medial thigh tenderness.  PE:  No  Previous DVT:  No  Anticoagulants:  Other:   DUPLEX EXAM:                CFV   SFV   PopV  PTV    GSV                R  L  R  L  R  L  R   L  R  L  Thrombosis    0  0  0     0     0      +  Spontaneous   +  +  +     +     +      0  Phasic        +  +  +     +     +      0  Augmentation  +  +  +     +     +      0  Compressible  +  +  +     +     +      0  Competent     0     0     0   Legend:  + - yes  o - no  p - partial  D - decreased   IMPRESSION:  1. No evidence of deep venous thrombosis noted in the right lower      extremity, with reflux noted within the right common femoral,      superficial femoral and popliteal veins.  2. Totally occluded right greater saphenous vein noted from the distal      insertion site to the lateral accessory saphenous vein level.  3. Reflux is noted at the saphenofemoral junction level, with no      reflux noted extending into the lateral accessory saphenous vein.  4. Partially occluded varicose veins of the right medial calf region      noted.    _____________________________  [name], M.D.   CH/MEDQ  D:  08/15/2008  T:  08/15/2008  Job:  [id number]

## 2010-10-02 NOTE — H&P (Signed)
NAME:  Geoffrey, Ali NO.:  [id number]   MEDICAL RECORD NO.:  [id number]          PATIENT TYPE:  INP   LOCATION:  NA                           FACILITY:  [facility]   PHYSICIAN:  Georges Lynch. Gioffre, M.D.DATE OF BIRTH:  1911/07/14   DATE OF ADMISSION:  06/12/2007  DATE OF DISCHARGE:                              HISTORY & PHYSICAL   CHIEF COMPLAINT:  Severe lower back and left leg pain.   HISTORY OF PRESENT ILLNESS:  The patient is an 77 year old gentleman who  presented to Dr. Jeannetta Ellis office for evaluation lower back and left leg  pain.  Evaluation included MRI and CT myelogram.  He was shown to have  some significant spinal stenosis at L4-5 and L5-S1 with a small  herniated disk at L5-S1 on the left.  This correlates with the patient's  discomfort.  He is currently taking Vicodin with minimal short-term  improvement.  The patient states he is unable to tolerate the pain any  more and he could not go on that way anymore due to the pain.  He would  like to proceed with surgical correction.  The patient has also recently  been evaluated by Dr. Patty Sermons who has given him surgical clearance.   ALLERGIES:  NO KNOWN DRUG ALLERGIES.   CURRENT MEDICATIONS:  1. Vicodin 5 mg 1-2 tablets every 4-6 hours for pain if needed.  2. Percocet 1-2 tablets every 4-6 hours for severe pain.  3. Norvasc 5 mg a day.  4. Hydrochlorothiazide 25 mg a day.  5. Singulair 25 mg a day.  6. Multivitamins.   PAST MEDICAL HISTORY:  1. Hypertension.  2. Coronary artery disease with an MI in 1976.  3. History of ulcers and diverticulitis.  4. BPH.   PAST SURGICAL HISTORY:  1. Cholecystectomy.  2. Venous stripping.  3. Right total hip arthroplasty.  The patient denies any complications      with the previous surgical procedures.   PRIMARY CARE PHYSICIAN:  Dr. Patty Sermons.  GI specialist, Dr. Matthias Hughs.   REVIEW OF SYSTEMS:  Negative for any hematologic, neurologic, pulmonary  issues.  The  patient denies any recent chest pains, shortness of breath.  GI:  His last ulcer was many years ago.  Last case of diverticulitis 4  years ago.  He had a cholecystectomy 25 years ago without any recent  problems.  GU:  He does have BPH with no medications.  ENDOCRINE:  Denies any problems.   FAMILY MEDICAL HISTORY:  Father is deceased from an MI at age of 56.  Mother is deceased from multiple medical issues including rheumatoid  disease.   SOCIAL HISTORY:  The patient is married and lives with his wife.  He  stopped smoking 32 years previous.  He has an occasional alcoholic  beverage.   PHYSICAL EXAMINATION:  VITAL SIGNS:  Height is 5 feet 7 inches, weight  is 150 pounds, blood pressure is 128/54, pulse 74 and regular,  respirations 12, the patient is afebrile.  GENERAL:  This is an elderly-appearing gentleman, frail, ambulates with  a very shuffled gait with a very stooped over  type position.  HEENT:  Head was normocephalic.  He does have bilateral hearing aids in place.  NECK:  Supple.  No palpable lymphadenopathy.  He had good range of  motion.  CHEST:  Lung sounds were clear and equal bilaterally.  HEART:  Regular rate and rhythm, no murmurs.  ABDOMEN:  Positive bowel sounds, soft.  EXTREMITIES:  He had good range of motion of his upper extremities.  Motor strength was 4/5.  Lower extremities with good range of motion of  both hips today without any discomfort, both knees and ankles.  NEUROLOGIC:  The patient was conscious, alert and appropriate.  He did  have some decreased sensation over the lateral aspect of his left leg.  He did have a slight weakness in his left dorsiflexion of his ankle as  compared to his right.  He did have some painful range of motion of his  back.  BREAST, RECTAL AND GU:  Exams were deferred at this time.   IMPRESSION:  1. Severe spinal stenosis at L4-5, L5-S1 with herniated nucleus      pulposus at L5-S1 on the left.  2. Hypertension.  3.  Coronary artery disease with a history of an MI.  4. History of benign prostatic hypertrophy.  5. History of ulcers and diverticuli.   PLAN:  The patient has been evaluated by Dr. Patty Sermons and has been  cleared for this upcoming surgical procedure.  The patient will undergo  all other routine labs and tests prior to having a central decompressive  lumbar laminectomy at L4-5, L5-S1 and possible microdiskectomy at L5-S1  on the left.      Jamelle Rushing, P.A.    ______________________________  Georges Lynch Darrelyn Hillock, M.D.    RWK/MEDQ  D:  06/10/2007  T:  06/11/2007  Job:  [id number]

## 2010-10-02 NOTE — Assessment & Plan Note (Signed)
OFFICE VISIT   [name], [name]  DOB:  July 22, 1911                                       07/26/2008  EAVWU#:[id number]   The patient returns today with a new venous stasis ulcer on the right  lower extremity.  He has never previously had stasis ulcers on the right  leg.  He did have a stasis ulcer on the left leg which healed following  successful laser ablation and stab phlebectomies in 2007.  He was  certified to have the same procedure done on the right side but he  elected not to have it done at that time despite having symptomatic  varicosities and severe skin changes and demonstration of severe reflux  in the right greater saphenous system.  He now has been having  increasing darkness of this lower third of the right leg and skin with  stasis ulcer developing spontaneously a few weeks ago draining clear  serous fluid.  He has been wearing elastic compression stockings while  this developed and has not worn this for the last several months on the  right side.  He describes an aching throbbing discomfort in the right  leg the more he is on his feet during the day.  He has had no bleeding  and this is the first stasis ulcer he has developed.  He has edema in  the right ankle and foot as the day progresses.   Repeat venous duplex exam today in our vascular lab reveals gross reflux  at the right saphenofemoral junction and throughout the right great  saphenous vein down to the ankle level.  Deep system has some mild  reflux on the right.   I feel that we should proceed with laser ablation of his right great  saphenous vein as was previously recommended to treat the active stasis  ulcer and worsening skin conditions on the right side.  We will proceed  with precertification for this.  He will also have some stab  phlebectomies performed at the same setting.   [name], M.D.  Electronically Signed   JDL/MEDQ  D:  07/26/2008  T:  07/27/2008   Job:  [id number]

## 2010-10-02 NOTE — Consult Note (Signed)
NAME:  [name], [name] NO.:  [id number]   MEDICAL RECORD NO.:  [id number]          PATIENT TYPE:  INP   LOCATION:  1522                         FACILITY:  [facility]   PHYSICIAN:  Georges Lynch. Gioffre, M.D.DATE OF BIRTH:  08/10/1911   DATE OF CONSULTATION:  DATE OF DISCHARGE:                                 CONSULTATION   HISTORY:  He is being admitted through the ER.  I recently operated on  [name] about a week and half ago.  Did a decompressive lumbar  laminectomy.  He had severe lower extremity pain.  At that time he did  extremely well.  He was discharged back to [name] at a rehab unit.  Apparently he got up this morning on his own.  He was holding onto the  rail.  He let go and fell in the bathroom and sustained an injury to his  left hip region.  His only complaint was pain about the left lower pubic  area.   PAST MEDICAL HISTORY:  There is history of hypertension and spinal  stenosis.   PAST SURGICAL HISTORY:  He recently had a decompressive lumbar  laminectomy at two levels by me about a week and a half ago.   ALLERGIES:  None.   MEDICATIONS AT HOME:  1. Hydrochlorothiazide 12.5 mg a day.  2. Norvasc 5 mg a day.  3. Percocet 10/650 one every 4 hours p.r.n. as needed.  4. Robaxin 500 mg one by mouth b.i.d. for spasms as needed.  5. Sinequan oral, Sinequan 25 mg at bedtime.   PHYSICAL EXAM:  GENERAL:  He is alert and oriented.  He is somewhat  sedated because he had morphine and Ativan but he is able to give me a  history.  He is accompanied by his daughter.  VITAL SIGNS:  His blood pressure is 121/54 was the latest today at  12:08.  Pulse was 70, respirations 16, temperature 98.6.  HEENT:  Exam of the head and neck was negative.  The mouth was clear.  EXTREMITIES:  The upper extremities were normal.  No signs of any  fracture or dislocation.  BACK:  I examined his wound.  His wound site looks fine.  The sutures  are still in place.  In regards  to his right hip he has no pain with  right hip motion.  He had a right total hip in the past.  In his left  hip he has pain with motion over the left hip directly over the pubic  ramus.  The remaining part of the lower extremities show no fractures or  dislocations of the lower extremities.  LUNGS:  Were clear bilaterally.  No rales or rhonchi or wheezes.  ABDOMEN:  Abdomen was soft, nontender.  No signs of any masses or  extreme tenderness.  SKIN:  Normal.  HEART:  Normal sinus rhythm with no murmur.   X-RAYS:  I reviewed the x-rays of his hip.  The right total hip looks  fine.  X-ray of his pelvis shows that he has a nondisplaced fracture  inferior pubic ramus.  No signs of  any hip fractures on that side.   IMPRESSION:  1. Postoperative back surgery about 9 days ago.  He had a      decompressive lumbar laminectomy for spinal stenosis.  2. He had an inferior pubic rami fracture on the left.  3. Rule out stress fracture about the left hip but nothing shows on      the plain films today.   COMMENT:  We are going to admit him for pain control.  If his pain  persists I may need a CAT scan of the left hip.           ______________________________  Georges Lynch. Darrelyn Hillock, M.D.     RAG/MEDQ  D:  06/19/2007  T:  06/19/2007  Job:  [id number]

## 2010-10-02 NOTE — Assessment & Plan Note (Signed)
OFFICE VISIT   [name], [name]  DOB:  [birth year]                                       08/15/2008  KYHCW#:[id number]   Mr. Los underwent laser ablation of his right great saphenous vein  with some stab phlebectomies in the right calf region 1 week ago.  He  had severe venous insufficiency of the right great saphenous system and  a history of stasis ulcers.  The ulcer had healed prior to the  procedure.  He has had some mild aching and throbbing discomfort over  the course of the great saphenous vein in the thigh from the groin to  the knee, but other than that has had no distal swelling or discomfort  at the stab phlebectomy sites.  He has been wearing his long-leg elastic  stocking.   On examination today, there is minimal tenderness along the course of  the great saphenous vein as one would expect in the thigh with no distal  edema and nicely healing stab phlebectomy sites.  Venous Duplex  examination reveals no evidence of deep venous obstruction and total  occlusion of the right great saphenous vein from the entry site up to  the lateral accessory saphenous vein near the saphenofemoral junction  where there is some reflux at the junction.   I reassured him regarding these findings and he will continue to wear  short-leg stockings on a chronic basis and return to see [place] on a p.r.n.  basis.   [name], M.D.  Electronically Signed   JDL/MEDQ  D:  08/15/2008  T:  08/15/2008  Job:  [id number]

## 2010-10-02 NOTE — Procedures (Signed)
LOWER EXTREMITY VENOUS REFLUX EXAM   INDICATION:  Right leg varicose vein with ulcer in the bleeding site.   EXAM:  Using color-flow imaging and pulse Doppler spectral analysis, the  right common femoral, superficial femoral, popliteal, posterior tibial,  greater and lesser saphenous veins are evaluated.  There is evidence  suggesting deep venous insufficiency in the right lower extremity.   The right saphenofemoral junction is not competent.  The right GSV is  not competent with the caliber as described below.  The end branch of  the right greater saphenous is also not competent.   The right proximal short saphenous vein demonstrates competency.   GSV Diameter (used if found to be incompetent only)                                            Right    Left  Proximal Greater Saphenous Vein           0.35 cm  cm  Proximal-to-mid-thigh                     0.35 cm  cm  Mid thigh                                 cm       cm  Mid-distal thigh                          cm       cm  Distal thigh                              cm       cm  Knee                                      cm       cm   IMPRESSION:  1. Right greater saphenous vein reflux is identified with the caliber      ranging from 0.35 cm to 0.63, mid thigh to groin and the branch      with the caliber ranging from 0.45 to 0.52, mid thigh to ankle.  2. The right greater saphenous vein is not aneurysmal.  3. The right greater saphenous vein is not tortuous.  4. The deep venous system is not competent.  5. The right lesser saphenous vein is competent.  6. No evidence of deep venous thrombosis noted in the right leg.       ___________________________________________  Quita Skye. Hart [place], M.D.   MG/MEDQ  D:  07/26/2008  T:  07/26/2008  Job:  [id number]

## 2010-10-02 NOTE — Assessment & Plan Note (Signed)
OFFICE VISIT   [name], [name]  DOB:  Jul 22, 1911                                       04/11/2008  ZOXWR#:[id number]   Patient returns today for change of his Unna boot.   The ulceration on the lateral aspect of his left lower leg seems  slightly smaller with some ingrowth of skin in the mid portion.  Today  it measures maximally 3 cm x 1 cm.  There is some bruising anterior to  the ulcer, which the wife is concerned about, but this does not appear  to be skin breakdown or ulceration extension.  The foot does appear well  perfused with no distal edema.   We will continue weekly Unna boot changes, and I will see him back in 4-  5 weeks.   [name], M.D.  Electronically Signed   JDL/MEDQ  D:  04/11/2008  T:  04/12/2008  Job:  [id number]

## 2010-10-02 NOTE — Assessment & Plan Note (Signed)
OFFICE VISIT   AVISHAI, REIHL [name]  DOB:  1911/08/02                                       06/06/2008  EAVWU#:[id number]   Patient returns today after 10 weeks of Unna boot treatment for the  stasis ulcer in the left lateral leg.  The ulcer is completely healed  today.  The skin is thin over the healed area, but there is no  ulceration present.  He continues to have a 2+ dorsalis pedis pulse but  no distal edema.   He was advised to wear a short-leg elastic compression stocking and  watch closely for recurrence of the ulcer, elevate the leg at night, and  will return to see [place] on a p.r.n. basis.   [name], M.D.  Electronically Signed   JDL/MEDQ  D:  06/06/2008  T:  06/07/2008  Job:  [id number]

## 2010-10-02 NOTE — Discharge Summary (Signed)
NAME:  [name], [name] NO.:  [id number]   MEDICAL RECORD NO.:  [id number]          PATIENT TYPE:  INP   LOCATION:  1522                         FACILITY:  [name]   PHYSICIAN:  Georges Lynch. Gioffre, M.D.DATE OF BIRTH:  1911/06/05   DATE OF ADMISSION:  06/19/2007  DATE OF DISCHARGE:  06/22/2007                               DISCHARGE SUMMARY   He was recently operated on by me for a severe spinal stenosis.  He went  home.  He was doing well.  In fact, he went back to [facility] doing  well.  Basically, he fell in the bathroom there and sustained a fracture  of his pubic ramus on the left.  He was admitted for pain control.  He  was seen daily.  On June 20, 2007, he was still in quite a bit of  pain.  On June 21, 2007, a CT scan of his hip was ordered that showed  a fracture of the pubic ramus, and he had no significant fracture about  the hip.  He felt a little better today on rounds on June 22, 2007.  He was stable.  Wound site looked excellent.  So, our plan now is to  discharge him back to [facility].  His vital signs remained stable in  the hospital.   His laboratory work:  His hemoglobin on June 21, 2007 was 8.9,  hematocrit 25.8.  Sodium 133, potassium 4.1, chloride 101, BUN 19,  creatinine 0.96.  As far as his vital signs, he remained afebrile with  blood pressure of 128/50, pulse 62 and regular, respirations 18.  X-ray  findings I mentioned above.   Discharge date is today on June 22, 2007 back to [facility].   FOLLOW-UP CARE:  He should have his sutures out Thursday in the nursing  facility for his back.  I should see him in the office in about 2 weeks.  There is no other real significant problems here.   STATUS:  He will be able to be up with a walker, partial weight as  tolerated.           ______________________________  Georges Lynch. Darrelyn Hillock, M.D.     RAG/MEDQ  D:  06/22/2007  T:  06/22/2007  Job:  [id number]

## 2010-10-02 NOTE — Procedures (Signed)
DUPLEX DEEP VENOUS EXAM - LOWER EXTREMITY   INDICATION:  Left calf ulcer.   HISTORY:  Edema:  Left calf edema/ulcer.  Trauma/Surgery:  Left greater saphenous vein laser ablation in August of  2007.  Pain:  Left calf pain and redness.  PE:  No.  Previous DVT:  No.  Anticoagulants:  No.  Other:   DUPLEX EXAM:                CFV   SFV   PopV  PTV    GSV                R  L  R  L  R  L  R   L  R  L  Thrombosis    0  0     0     0      0  Spontaneous   +  +     +     +      +  Phasic        +  +     +     +      +  Augmentation  +  +     +     +      +  Compressible  +  +     +     +      +  Competent     0  +     +     0      +   Legend:  + - yes  o - no  p - partial  D - decreased   IMPRESSION:  1. No evidence of left leg deep vein thrombus.  2. The left greater saphenous vein is occluded.  3. The left popliteal vein is incompetent.   SECONDARY FINDINGS:  There appears to be a large lymph node in the left  groin.  There appears to be atherosclerosis in the left superficial  femoral artery.    _____________________________  [name], M.D.   MC/MEDQ  D:  03/25/2008  T:  03/25/2008  Job:  [id number]

## 2010-10-05 NOTE — Op Note (Signed)
NAME:  [name], [name]                    ACCOUNT NO.:  [id number]   MEDICAL RECORD NO.:  [id number]                   PATIENT TYPE:  INP   LOCATION:  3309                                 FACILITY:  [facility]   PHYSICIAN:  Florencia Reasons, M.D.             DATE OF BIRTH:  07/10/1911   DATE OF PROCEDURE:  12/16/2001  DATE OF DISCHARGE:                                 OPERATIVE REPORT   PROCEDURE PERFORMED:  Upper endoscopy.   ENDOSCOPIST:  Florencia Reasons, M.D.   INDICATIONS FOR PROCEDURE:  The patient is an 77 year old gentleman with  hematochezia of approximately 36 hours duration and current hemoglobin of  8.6 with essentially normal BUN and stable vital signs, and a prior history  of a diverticular bleed four years ago.  This is presumed to be a  diverticular bleed as of the present time but the patient had been on a  daily baby aspirin prior to admission and has a remote prior history of  peptic ulcer disease, so endoscopic confirmation of the absence of an upper  tract source of bleeding was felt to be clinically appropriate.   FINDINGS:  Essentially normal exam.   DESCRIPTION OF PROCEDURE:  The nature, purpose and risks of the procedure  were familiar to the patient from prior examination and he provided written  consent.  The Olympus small caliber adult video endoscope was passed under  direct vision following topical pharyngeal anesthesia with Cetacaine spray  and intravenous sedation with Versed 2 mg IV.  The esophagus was readily  entered.  The vocal cords were not well seen.   The esophagus was endoscopically normal.  There was no evidence of reflux  esophagitis, Barrett's esophagus, varices, infection, neoplasia or any  Mallory-Weiss tear.  There was a well-defined Schatzki's ring at the  squamocolumnar junction and below this was a  2 cm  hiatal hernia.  The stomach contained no blood or coffee ground  material and was essentially empty with just some minimal  clear residual  which was suctioned up.  No gastritis, erosions, ulcers, polyps or masses  were observed including a retroflex view of the proximal stomach which  showed just the small  hiatal hernia from its inferior perspective.   The pylorus, duodenal bulb and second duodenum all looked normal.  The  duodenum and the stomach were reinspected prior to removal of the scope and  again no significant abnormalities were identified.   The scope was then removed from the patient.  The patient tolerated the  procedure well and there were no apparent complications. No biopsies were  obtained.   IMPRESSION:  1. No source of bleeding endoscopically evident.  No blood or coffee ground     material present.  2.     Small  hiatal hernia with esophageal ring.  3. No evidence of aspirin induced gastropathy.   PLAN:  The patient will be treated as for a diverticular bleed  with clinical  monitoring and supportive care.                                                 Florencia Reasons, M.D.    RVB/MEDQ  D:  12/16/2001  T:  12/22/2001  Job:  [id number]   cc:   Thomas A. Patty Sermons, M.D.

## 2010-10-05 NOTE — H&P (Signed)
NAME:  [name], [name]                    ACCOUNT NO.:  [id number]   MEDICAL RECORD NO.:  [id number]                   PATIENT TYPE:  INP   LOCATION:  2312                                 FACILITY:  [facility]   PHYSICIAN:  Bernette Redbird, M.D.                DATE OF BIRTH:  1912-04-10   DATE OF ADMISSION:  04/12/2002  DATE OF DISCHARGE:                                HISTORY & PHYSICAL   REASON FOR ADMISSION:  This 77 year old gentleman is being admitted to the  hospital because of GI bleeding.   HISTORY:  The patient was in his usual state of health until this morning,  when he passed a rather explosive bowel movement of dark and red blood,  followed by another one at 1 o'clock this afternoon, associated with some  degree of weakness but no dizziness and no abdominal pain.   He has a past history of diverticular bleeding on two occasions,  specifically May of 1999, when he received two units of packed cells, and  then again in July of this year, when he received one units of packed cells.  From July up until now, he had been free of any evidence of any bowel habit  irregularity or GI bleeding and a followup hemoglobin in September of this  year showed a hemoglobin of 14.8.   The patient is known to have pancolonic diverticulosis, based on colonoscopy  done approximately five years ago.  He has not had a repeat lower GI study  since that time.   Note that the patient is on a daily baby aspirin; upper endoscopy at the  time of his last admission was negative (he was also on aspirin at that  time).   ALLERGIES:  No known allergies.   OUTPATIENT MEDICATIONS:  1. Sinequan 25 mg each evening.  2. Daily baby aspirin.  3. Occasional quinine.  4. Daily vitamins.   PAST SURGICAL HISTORY:  Remote open cholecystectomy may years ago; total hip  replacement, February of 2002; right inguinal hernia repair, February 2003.   PAST MEDICAL HISTORY:  Medical illnesses:  1. Remote  history of MI in 1976 without subsequent recurrence or any recent     anginal symptoms.  2. Prior history of diverticular bleeding in 1999 and July of 2003 as     described above.  3. Remote history of peptic ulcer disease.  4. Chronic microhematuria.   The patient has no known history of hypertension, diabetes or COPD.   HABITS:  The patient stopped smoking about 30 years ago after smoking  heavily for 40 years.  He has two mixed drinks a day.   FAMILY HISTORY:  Originally, the patient was thought to have a family  history of colon cancer in one of his sisters, but on further questioning,  it sounds as though this was sort of intra-abdominal malignancy or colon  cancer.   SOCIAL HISTORY:  The patient still works as  a Ambulance person.  He lives alone.   REVIEW OF SYSTEMS:  Per HPI.  Mild arthritis in his middle finger.  Recent  rash treated with prednisone with prompt response about six weeks ago, cause  never determined.  No other GI symptoms.  No chest pain or trouble  breathing.  No chronic diarrhea.   PHYSICAL EXAMINATION:  GENERAL:  The patient looks perhaps slightly pale but  is certainly in no evident distress.  His weight is 162 is essentially  unchanged from when he was last seen in the office two months ago.  VITAL SIGNS:  Blood pressure is 180/84 with pulse of 72, supine, and 100/60  with pulse of 80, standing.  His heart rate went from 72 to 80 during that  maneuver.  HEENT:  The gaze is conjugate.  The sclerae are anicteric.  The oropharynx  is benign.  CHEST:  Chest clear to auscultation.  HEART:  No gallops, rubs, murmurs, clicks or arrhythmias appreciated.  ABDOMEN:  Active bowel sounds.  Well-healed cholecystectomy scar.  No  organomegaly, guarding, masses or tenderness.  RECTAL:  Rectal shows massive prostatic enlargement and dark cherry-red  liquid blood as well as some darker clotted or coalesced blood material  which is almost black in  color.  NEUROLOGIC:  No obvious cranial nerves or focal motor deficits.   LABORATORY DATA:  Labs pending at this time.  They will be obtained after he  goes to the hospital from the office, where he is being examined.   IMPRESSION:  The overall picture is compatible with a recurrent diverticular  bleed.  The appearance of the blood by rectal exam plus the absence of  significant orthostatic symptomatology suggest that this is indeed again a  lower tract bleed as opposed as to an upper gastrointestinal bleed with  rapid transit.   PLAN:  1. Admission to the hospital, preferably a telemetry bed.  2. Obtain baseline blood work.  If his BUN is substantially elevated, may     need to consider endoscopic evaluation to rule out an upper trace bleed     (doubt).  3. Monitor CBC and vital signs.  Transfuse for hemoglobin less than     approximately 8 to 8.5.  4. Surgical consultation if the patient becomes significantly unstable or     has persistent bleeding requiring ongoing transfusion.                                               Bernette Redbird, M.D.    RB/MEDQ  D:  04/12/2002  T:  04/12/2002  Job:  [id number]

## 2010-10-05 NOTE — H&P (Signed)
NAME:  [name], [name]                    ACCOUNT NO.:  [id number]   MEDICAL RECORD NO.:  [id number]                   PATIENT TYPE:  INP   LOCATION:  5114                                 FACILITY:  [facility]   PHYSICIAN:  Florencia Reasons, M.D.             DATE OF BIRTH:  Aug 09, 1911   DATE OF ADMISSION:  12/16/2001  DATE OF DISCHARGE:  12/19/2001                                HISTORY & PHYSICAL   REASON FOR ADMISSION:  This 77 year old gentleman is being admitted to the  hospital because of GI bleeding.   HISTORY OF PRESENT ILLNESS:  The patient was in his usual state of health  until yesterday morning when he was on his way to a meeting and had urgency  of defecation and diarrhea.  He subsequently had urge fecal incontinence of  blood and one further bloody bowel movement later in the day while out on  the golf [place].  This morning when he awakened he was very weak and  orthostatic and had to support himself to stand up, so he presented to the  emergency room at our recommendation after first contacting the office of  his primary physician, Dr. Cassell Ali.   He has had no abdominal pain or fever with this condition.  Of note, the  patient had a diverticular bleed requiring transfusion of two units of  packed cells four years ago, at which time upper endoscopy was negative  despite a remote history of ulcer disease and the fact that the patient was  taking then, and still uses, baby aspirin once daily without any GI  prophylactic therapy.  Note that he has not had any upper tract prodromal  symptoms.   ALLERGIES:  No known drug allergies.   CURRENT MEDICATIONS:  1. Sinequan 25 mg q.h.s.  2. Baby aspirin 81 mg daily.  3. Vitamin E.  4. Quinine p.r.n.   PAST SURGICAL HISTORY:  1. Remote open cholecystectomy many years ago.  2. Total hip replacement in February 2002.  3. Right inguinal hernia repair in February 2003, by Dr. Rozetta Nunnery.   PAST MEDICAL HISTORY:  1. Remote history of MI in 1976, without subsequent recurrence or any     current or recent anginal symptoms.  2. Diverticular bleeding in May 1999, requiring transfusion of two units of     packed cells.  3. Remote history of peptic ulcer disease.  4. Chronic microhematuria.  5. Negative for hypertension, diabetes or COPD.   HABITS:  The patient stopped smoking about 30 years ago after smoking  heavily for 40 years.  The patient typically has about two mixed drinks a  day.   FAMILY HISTORY:  Colon cancer in a sister diagnosed around age 33.  Mother  died of severe rheumatoid arthritis.  Father died of MI.   SOCIAL HISTORY:  The patient is married and lives with his wife, [name], who  is a patient of mine.  He  is a graduate of [employer] and is  still working as a Tour manager.  He lives at [place]  and is quite active.   REVIEW OF SYMPTOMS:  Globally negative.  Specifically, no history of  anorexia, weight loss, dysphagia, reflux symptomatology, cough, shortness of  breath, chest pain, sore throat, abdominal pain, nausea, constipation or  diarrhea prodromal to this illness.  No gross hematuria, just microhematuria  as noted above.   PHYSICAL EXAMINATION:  GENERAL:  This is a remarkably, well-preserved, 44-  year-old gentleman who looks about 20 years younger than his stated age.  VITAL SIGNS:  Blood pressure 140/50, pulse 70 at the present time.  HEENT:  Anicteric, oropharynx benign.  Moderate conjunctival pallor.  CHEST:  Clear to auscultation. No rhonchi, rales or wheezes.  HEART:  No gallops, rubs, murmurs, clicks or arrhythmias appreciated.  ABDOMEN:  Normal bowel sounds, no bruits, no organomegaly, guarding, mass or  tenderness.  RECTAL:  Not repeated, having shown bloody material when checked by the  emergency room physician assistant.   LABORATORY DATA:  White count 5000, hemoglobin 9.6 with an MCV of 96,  platelets slightly low at  123,000.  Metabolic panel pertinent for sodium  131, glucose 115, BUN 18 with creatinine of 0.9.  Total bilirubin 1.4.  Other liver chemistries normal.  Albumin 3.2.   IMPRESSION:  1. Recurrent hematochezia consistent with a recurrent diverticular bleed.  2. Posthemorrhagic anemia, moderate.  3. Thrombocytopenia, mild.  4. Prior history of diverticular bleeding.  5. Remote history of peptic ulcer disease, now with aspirin exposure.  6. Family history of colon cancer with no polyps identified on colonoscopy     four years ago.  7. History of coronary disease status post myocardial infarction in 1976.   DISCUSSION AND PLAN:  Almost certainly this is a lower tract bleed from  diverticulosis, although vascular ectasia of the lower GI tract could  present in a similar fashion.  Less likely would be an upper tract bleed  from aspirin-induced gastropathy.  The fact that BUN is in the normal range  tonight would go against an upper tract source, but it appears that most of  his bleeding occurred yesterday and it is conceivable that would we would  have a false-normal BUN today.   PLAN:  1. Endoscopy this evening to confirm the absence of an upper tract source of     blood loss which would substantially change the patient's prognosis,     management and future treatment.  2. Presuming that the endoscopy is negative, the patient will be managed     expectantly with supportive care and clinical monitoring.                                               Florencia Reasons, M.D.   RVB/MEDQ  D:  12/16/2001  T:  12/21/2001  Job:  [id number]   cc:   [name], M.D.

## 2010-10-05 NOTE — H&P (Signed)
[facility]  Patient:    [name], [name]                 MRN: [id number] Adm. Date:  06/30/00 Attending:  Ollen Gross, M.D. Dictator:   Alexzandrew L. Julien Girt, P.A.-C. CC:         Clovis Pu. Patty Sermons, M.D.   History and Physical  DATE OF BIRTH:  Jan 22, 1912  CHIEF COMPLAINT:  Right hip pain.  HISTORY OF PRESENT ILLNESS:  The patient is an 77 year old male who is kindly referred over to the care of Dr. Lequita Halt per Dr. Trellis Paganini for consideration of right total hip.  The patient has had pain over the past year which has been quite progressive and has started to interfere with his daily activities, and has decreased his active lifestyle.  The pain has been quite tolerable in the past, however, he has had increasing stiffness and more limitations on his function.  He has been unable to play golf without discomfort, and has a very difficult time getting around.  He is seen in the office, and x-rays demonstrate significant degenerative changes of the right hip with bone-on-bone deformity.  He does seem to have somewhat of a shallow acetabulum, but however, no true dysplasia.  It is felt due to the progressive nature of his pain and the findings on x-rays, that he would be a candidate for a right total hip replacement arthroplasty.  The risks and benefits of this procedure have been discussed with the patient at length, and he has elected to proceed with surgery.  ALLERGIES:  No known drug allergies.  CURRENT MEDICATIONS: 1. Aspirin 81 mg daily.  This is stopped prior to surgery. 2. Sinequan 25 mg at night.  PAST MEDICAL HISTORY: 1. Myocardial infarction in 1976. 2. History of gastrointestinal bleed in 1999. 3. Benign prostatic hypertrophy. 4. Previous history of transfusion with gastrointestinal bleed.  SOCIAL HISTORY:  The patient is widowed.  Consumes approximately 3 to 4 ounces of alcohol daily.  Prior smoking history, approximately for  40 years, however, quit 26 years ago.  He has three children.  He is retired, however, still manages some Retail banker.  He lives independent living in a cottage at [place] since 1995.  Please note, the patient will like to look into inpatient rehabilitation at [facility], or if not available, would like to look into rehabilitation at [facility].  FAMILY HISTORY:  Father deceased at age 66 with myocardial infarction.  Mother deceased at age 69 with a history of rheumatoid arthritis.  One sister deceased at age 26 with a history of stomach cancer.  REVIEW OF SYSTEMS:  GENERAL:  No fevers, chills, or night sweats.  NEUROLOGIC:  No seizures, syncope, or paralysis.  RESPIRATORY:  No shortness of breath, productive cough, or hemoptysis.  CARDIOVASCULAR:  No chest pain, angina, or orthopnea.  GASTROINTESTINAL:  No nausea, vomiting, diarrhea, or constipation. GASTROURINARY:  The patient does have some slight nocturia, one to two episodes on average.  He associates this with his benign prostatic hypertrophy.  MUSCULOSKELETAL:  Pertinent of the right hip found in the history of present illness.  PHYSICAL EXAMINATION:  VITAL SIGNS:  Pulse 60, respirations 12, blood pressure 185/79.  GENERAL:  The patient is an 77 year old white male, well-developed, well-nourished, short in stature, appears to walk with a mildly antalgic gait due to the pain.  He is alert and oriented, and cooperative.  HEENT:  Normocephalic, atraumatic.  Pupils are round and reactive.  Oropharynx  is clear.  Extraocular movements intact.  NECK:  Supple.  There is a trace bilateral bruit noted in the neck versus a referred murmur from the chest.  CHEST:  Clear to auscultation.  HEART:  Regular rate and rhythm with a grade 2/6 early systolic ejection murmur best auscultated at aortic and Erbs point.  ABDOMEN:  Soft, slightly round, nontender, bowel sounds are present.  No rebound or guarding.  RECTAL:   Not done, not pertinent to present illness.  BREASTS:  Not done, not pertinent to present illness.  GENITALIA:  Not done, not pertinent to present illness.  EXTREMITIES:  Right lower extremity:  There is approximately a 1/4 inch shortening on the right leg as compared to the left.  He has approximately 70 degrees of hip flexion, no internal rotation, only 5 degrees of external rotation.  Motor function is intact throughout the right lower extremity.  X-RAYS:  X-rays do show significant degenerative changes of the right hip with bone-on-bone deformity.  He has somewhat of a shallow acetabulum, but no true dysplasia.  IMPRESSION: 1. End-stage osteoarthritis, right hip. 2. Benign prostatic hypertrophy. 3. History of myocardial infarction in 1976. 4. History of gastrointestinal bleed in 1999. 5. History of transfusion with gastrointestinal bleed in 1999.  PLAN:  The patient will be admitted to [facility] to undergo right total hip replacement arthroplasty.  Surgery will be performed by Dr. Ollen Gross.  Dr. Cassell Clement is the patients cardiologist.  The patient has been seen by Dr. Patty Sermons and cleared for the up and coming surgery.  Dr. Patty Sermons will also be notified of the room number on admission, and will be consulted for medical assistance with this patient throughout the hospital course. DD:  06/26/00 TD:  06/27/00 Job: [id number]

## 2010-10-05 NOTE — Discharge Summary (Signed)
   NAME:  [name], [name]                    ACCOUNT NO.:  [id number]   MEDICAL RECORD NO.:  [id number]                   PATIENT TYPE:  INP   LOCATION:  4730                                 FACILITY:  [facility]   PHYSICIAN:  Bernette Redbird, M.D.                DATE OF BIRTH:  07-May-1912   DATE OF ADMISSION:  04/12/2002  DATE OF DISCHARGE:  04/17/2002                                 DISCHARGE SUMMARY   FINAL DIAGNOSES:  1. Recurrent diverticular hemorrhage.  2. Post hemorrhagic anemia.  3. Syncope secondary to #1 and #2.  4. Remote history of myocardial infarction.  5. Remote history of peptic ulcer disease.  6. Chronic microhematuria.  7. Status post cholecystectomy, hip replacement, inguinal hernia repair.   CONSULTATIONS:  None.   COMPLICATIONS:  None.   PROCEDURES:  None.   LABORATORY DATA:  Admission hemoglobin 12.4, falling to a nadir of 8.8  several days after admission but then it was 9.5 on the day of discharge.   HOSPITAL COURSE:  The patient has a prior history of diverticular hemorrhage  in May 1999 and July 2003, each time requiring transfusions.  He was doing  well until the day of admission when he had a couple of bloody bowel  movements and felt a little bit weak and drove himself to our office.  He as  evaluated, and  preparation were made for admission.  While sitting in the  exam room after his evaluation, the patient lost consciousness in the chair  presumably due to hypotensive syncope.  The ambulance was called, but he had  already regained consciousness after being placed in a supine position.  He  never lost a pulse.   He was admitted to an intensive care unit for observation but remained  stable following discharge and never required a transfusion.  It did not  appear he had any further bleeding during the course of his hospitalization,  and by the day of discharge, it was felt that he had been observed for  sufficiently long period of time that  rebleeding was highly unlikely.  Accordingly, disposition home was arranged.   DISPOSITION:  The patient is discharged home with no activity or diet  restrictions, but he was advised to avoid aspirin for the time being.  He  will continue the multivitamins he was taking previously.   He will follow up with Dr. Matthias Ali in the near future and contact us in the  event of recurrent bleeding.   CONDITION ON DISCHARGE:  Improved.                                                 Bernette Redbird, M.D.    RB/MEDQ  D:  06/13/2002  T:  06/13/2002  Job:  [id number]

## 2010-10-05 NOTE — Op Note (Signed)
[facility]  Patient:    [name], [name]                 MRN: [id number] Proc. Date: 06/30/00 Adm. Date:  [id number] Attending:  Ollen Gross V                           Operative Report  PREOPERATIVE DIAGNOSIS:  Osteoarthritis, right hip.  POSTOPERATIVE DIAGNOSIS:  Osteoarthritis, right hip.  PROCEDURE:  Right total hip arthroplasty.  SURGEON:  Dr. Lequita Halt.  ASSISTANT:  Avel Peace, P.A.-C.  ANESTHESIA:  Spinal.  ESTIMATED BLOOD LOSS:  200.  DRAINS:  Hemovac x 1.  COMPLICATIONS:  None.  CONDITION:  Stable to recovery.  BRIEF CLINICAL NOTE:  [name] is an 77 year old male with severe osteoarthritis of the right hip with symptoms refractory to non-operative management. He presents now for a total hip arthroplasty.  DESCRIPTION OF PROCEDURE:  After successful administration of spinal anesthetic, the patient was placed in the left lateral decubitus position with the right side up and held with the hip positioner. The right lower extremity was isolated from the perineum with plastic drapes and prepped and draped in the usual sterile fashion. A standard posterolateral incision was made, skin cut with a 10 blade through subcutaneous tissue to the level of the fascia lata which was incised in line with the skin incision. The short external rotators were isolated off the femur and then capsulectomy performed. The hip was then dislocated and the center of the femoral head marked. A new trial prosthesis was placed such that the center of the trial head corresponds to the center of the native femoral head. The osteotomy line is marked and then a cut made with an oscillating saw. The femoral head was removed. The femur was then retracted anteriorly and the anterior capsule removed. Acetabular exposure was then obtained.  The labrum and then the remainder of the capsule are removed. Acetabular reaming is started with a 49 coursing in  increments of 2 to a 55. A 56 mm pinnacle acetabular shell is impacted into the acetabulum and approximately 40 degrees of abduction, 20 degrees of forward flexion matching his native anteversion. The shell is then transfixed with three dome screws. The trial 28 mm 10 degree liner is then placed.  Femoral preparations initiated with the starter reamer and canal finder. The canal was irrigated and axial reaming performed up to 15.5 mm. Proximal reaming is performed as a 15D and the sleeve is machined to a large. A 15D large trial sleeve is placed and a 20 x 15 stem with a 36 plus eight neck is placed matching his native anteversion. A trial 28 plus zero head is placed, hip reduced with excellent stability, full extension, full external rotation, 70 degrees of flexion, 40 degrees adduction, 90 degrees internal rotation and 90 degrees of flexion, 70 degrees internal rotation. The soft tissue tension was appropriate. The wound was then copiously irrigated and trial is removed. The apex hole eliminator is placed into the acetabular shell. The permanent 28 mm 10 degree plus 4 liner is then placed at the high wall at the 11 oclock position. A permanent 20D large sleeve is impacted into the proximal femur and a 20 x 15 stem with a 36 plus 8 neck is impacted matching native anteversion. A permanent 28 plus zero head is placed, hip reduced with the same stability parameters. The wound was copiously irrigated with antibiotic solution and  the short external rotators reattached to the femur through drill holes. The fascia lata and fascia of the gluteus max were closed over one limb of the Hemovac drain with interrupted #1 Vicryl. The subcu was closed in two layers with interrupted #0 and then 2-0 Vicryl, subcuticular with running 4-0 monocryl. The incision was cleaned and dried and Steri-Strips and bulky sterile dressing applied. Drains hooked to suction. He was placed into a knee immobilizer, awakened  and transported to recovery in stable condition. DD:  06/30/00 TD:  07/01/00 Job: [id number]

## 2010-10-05 NOTE — Op Note (Signed)
[place]. [facility]  Patient:    [name], [name] [facility] Visit Number: [id number] MRN: [id number]          Service Type: DSU Location: [facility] Attending Physician:  Kandis Mannan Dictated by:   Donnie Coffin Samuella Cota, M.D. Proc. Date: 06/30/01 Admit Date:  06/30/2001   CC:         Thomas A. Patty Sermons, M.D.   Operative Report  PREOPERATIVE DIAGNOSIS:  Right inguinal hernia.  POSTOPERATIVE DIAGNOSIS:  Right inguinal hernia.  OPERATION:  Right inguinal herniorrhaphy with mesh.  SURGEON:  Maisie Fus B. Samuella Cota, M.D.  ANESTHESIA:  Local (0.25% Marcaine without epinephrine, 1% Xylocaine without epinephrine and sodium bicarbonate) with anesthesia monitoring.  ANESTHESIOLOGIST:  Maren Beach, M.D. and C.R.N.A.  DESCRIPTION OF PROCEDURE:   The patient was taken to the operating room and placed on the table in the supine position.  The right lower quadrant of the abdomen was prepped and draped in the sterile field.  An oblique incision was outlined with the skin marked.  Local anesthetic was then used to block the ilioinguinal nerve in the area for the incision.  It was then used to add as necessary.  Once the ilioinguinal nerve was exposed, the ilioinguinal nerve was blocked directly.  The incision was taken through the skin and subcutaneous tissue with subcutaneous bleeders being cauterized with the Bovie.  I ligated with 3-0 Vicryl.  The external oblique aponeurosis was then divided in the line of its fibers to and through the external ring.  The patient had considerable amount of scar tissue adherent to the external oblique aponeurosis.  He had a very large, orange-size, direct inguinal hernia which was quite adherent to the cord structures.  This was dissected free. The patient had some weakness at the internal ring which was actually above and lateral to the internal ring but there was no definite indirect hernia sac noted.  The large direct hernia was then  reduced and the inguinal flow was imbricated with a running suture of 0 chromic catgut.  Because of some weakness of the tissue superior and lateral to the internal ring, I also ran a 0 chromic suture to imbricate some of the tissues above and lateral to the internal ring.  There was some still sufficient room for the cord structures at the internal ring.  A piece of 3-inch x 6-inch _____ mesh was then fashioned to cover the entire inguinal floor and to extend around the cord structures superiorly and laterally.  The mesh was anchored inferiorly with a running suture of 2-0 Novofil with the first suture being placed in the pubic tubercle, the other sutures in the shelving edge of Pouparts ligament.  The mesh was anchored superiorly and medially with interrupted sutures of 0 Novofil.  A slit was made into the mesh to accommodate the cord structures. The mesh was then sutured to itself superior and lateral to the internal ring.  The mesh seemed to be lying nicely in the inguinal floor.   The cord structures were very small and there was no lipoma of the cord.  The ilioinguinal nerve had been seen and preserved.  It was then blocked with local anesthetic and placed back in its normal anatomical position, coming through the internal ring.  The external oblique aponeurosis was then reapproximated with interrupted sutures of 3-0 Vicryl.  Scarpas fascia was closed with 3-0 Vicryl and the skin was closed with a running subcuticular suture of 4-0 Vicryl.  Benzoin and half-inch Steri-Strips  were used to reinforce the skin closure.  Dry sterile dressing was applied.  The patient seemed to tolerate the procedure well and was taken to the PACU in satisfactory condition. Dictated by:   Donnie Coffin Samuella Cota, M.D. Attending Physician:  Kandis Mannan DD:  06/30/01 TD:  06/30/01 Job: [id number]

## 2010-10-05 NOTE — Discharge Summary (Signed)
NAME:  [name], [name]                    ACCOUNT NO.:  [id number]   MEDICAL RECORD NO.:  [id number]                   PATIENT TYPE:  INP   LOCATION:  5114                                 FACILITY:  [facility]   PHYSICIAN:  Florencia Reasons, M.D.             DATE OF BIRTH:  08/25/1911   DATE OF ADMISSION:  12/16/2001  DATE OF DISCHARGE:  12/19/2001                                 DISCHARGE SUMMARY   FINAL DIAGNOSES:  1. Lower gastrointestinal bleeding, presumed diverticular origin.  2. Post hemorrhagic anemia secondary to #1.  3. Prior history of diverticular bleed in 1999.  4. History of microhematuria.  5. Previous cholecystectomy.  6. Total hip replacement.  7. Inguinal hernia repair.  8. History of coronary artery disease, status post remote myocardial     infarction in 1976.  9. Thrombocytopenia and leukopenia, mild, questionable early bone marrow     dysfunction.   CONSULTATIONS:  None.   COMPLICATIONS:  None.   PROCEDURES:  1. Upper endoscopy.  2. Transfusion of 1 unit of packed red cells.   LABORATORY DATA:  Admission hemoglobin 9.6, felt to a nadir of 7.8 and  was  9.2 prior to  discharge. Following the transfusion of 1 unit of packed  cells. Admission white count 5000. Discharge white count 3800, platelet  123,000  on admission and 114,000 on discharge. Albumin 3.2. Liver  chemistries normal except for a total bilirubin of 1.4. BUN 18, creatinine  0.9.   HOSPITAL COURSE:  The patient was admitted through the emergency room after  he called our office reporting a roughly 24 hour history of bloody bowel  movements associated with mild weakness and dizziness. As noted, he had  moderate anemia at the time of admission, hemoglobin 9.6, and was admitted  to the step-down unit.   Because the patient takes a daily baby aspirin, an upper endoscopy was  performed on the evening of admission to help definitively exclude an upper  tract source  as the cause of his  bleeding, although clinically it was felt  to be more compatible with a lower GI source.   Not surprisingly, the endoscopy showed no source of bleeding. There was a  small hiatal hernia and an esophageal ring.   The patient was  then treated supportively with IV fluids and clinical  monitoring, and when his hemoglobin fell  to 7.8, a transfusion of 1 unit of  packed red cells was ordered. The patient remained  stable over the next  couple of days while in the hospital with essentially complete cessation of  his hematochezia and without any instability of vital signs. His diet  was  advanced and discharge home was felt to be clinically appropriate.   DISPOSITION:  The patient is discharged home on a regular diet with  resumption of all previous home medications except aspirin. He may engage in  activity as tolerated and is to follow up with Dr.  Buccini in the office in  approximately 1 to 2 weeks.   DISCHARGE MEDICATIONS:  1. Sinequan 25 mg q.h.s.  2. Vitamin E.  3. Quinine p.r.n.  4. Hold aspirin for the time being.   CONDITION ON DISCHARGE:  Improved.                                               Florencia Reasons, M.D.    RVB/MEDQ  D:  01/06/2002  T:  01/08/2002  Job:  [id number]   cc:   Thomas A. Patty Sermons, M.D.

## 2010-10-05 NOTE — Op Note (Signed)
[facility]  Patient:    [name], [name] [facility] Visit Number: [id number] MRN: [id number]          Service Type: NES Location: [facility] Attending Physician:  Tania Ade Dictated by:   Lucrezia Starch. Ovidio Hanger, M.D. Proc. Date: 11/10/01 Admit Date:  11/10/2001 Discharge Date: 11/10/2001                             Operative Report  PREOPERATIVE DIAGNOSIS:  Hematuria and questionable positive cytologies.  POSTOPERATIVE DIAGNOSIS:  Hematuria and questionable positive cytologies.  OPERATIVE PROCEDURE:  Cystourethroscopy, bilateral retrograde ureteral pyelograms.  SURGEON:  Lucrezia Starch. Ovidio Hanger, M.D.  ANESTHESIA:  General laryngeal airway.  ESTIMATED BLOOD LOSS:  Negligible.  TUBES:  None.  COMPLICATIONS:  None.  INDICATIONS FOR PROCEDURE:  The patient is a very nice 77 year old white male who presented with microhematuria.  Subsequently, another workup consisting of bilateral renal ultrasound and cystourethroscopy which revealed significant BPH with enlarged median bar, but no lesions were noted.  Voiding cytologies were obtained after reparative effects had occurred.  He was noted to have equivocal cytologies.  CT scan of the abdomen and pelvis were without lesions. After understanding the risks, benefits, and alternatives, he elected to proceed with cysto, bilateral retrograde ureteral pyelograms.  DESCRIPTION OF PROCEDURE:  The patient was placed in the supine position and after proper general laryngeal airway anesthesia, was placed in the dorsolithotomy position, prepped and draped with Betadine in a sterile fashion.  Cystourethroscopy was performed with a 22.5-French Olympus panendoscope utilizing the 12 and 70 degree lenses.  The bladder was carefully inspected.  He had significant trilobar hypertrophy with a median bar.  Grade 1 trabeculation was noted.  Efflux of clear urine was noted from the normally placed ureteral orifices  bilaterally which were under the median bar, and under fluoroscopic guidance, bilateral retrograde ureteral pyelogram was performed with an 8-French cone-tip catheter.  Both collecting systems were noted to be fully patent.  No filling defects were noted and they drained well following injection, and basically, a normal bilateral retrograde ureteral pyelogram.  The bladder was drained.  The panendoscope was removed.  The patient was taken to the recovery room stable.Dictated by:   Lucrezia Starch. Ovidio Hanger, M.D. Attending Physician:  Tania Ade DD:  11/10/01 TD:  11/11/01 Job: [id number]

## 2010-10-05 NOTE — Discharge Summary (Signed)
[facility]  Patient:    [name], [name]                 MRN: [id number] Adm. Date:  [id number] Disc. Date: 07/04/00 Attending:  Loanne Drilling Dictator:   Druscilla Brownie. Shela Nevin, P.A. CC:         Clovis Pu. Patty Sermons, M.D.                           Discharge Summary  PROCEDURE:  On June 30, 2000, the patient underwent right total hip             replacement arthroplasty with Avel Peace, P.A.-C assisting.  CONSULTATIONS:  None.  ADMITTING DIAGNOSES: 1. End-stage osteoarthritis, right hip. 2. Benign prostatic hypertrophy. 3. History of myocardial infarction in 1976. 4. History of gastrointestinal bleed in 1999. 5. History of transfusion with gastrointestinal bleed in 1999.  DISCHARGE DIAGNOSES: 1. End-stage osteoarthritis, right hip. 2. Benign prostatic hypertrophy. 3. History of myocardial infarction in 1976. 4. History of gastrointestinal bleed in 1999. 5. History of transfusion with gastrointestinal bleed in 1999. 6. Mild postoperative anemia.  HISTORY OF PRESENT ILLNESS:  This 77 year old male was referred to [place] for continuing and progressive pain into his right hip.  As Dr. Ladora Daniel is no longer active in surgery, Dr. Lequita Halt saw the patient.  The patient is a very active gentleman, resident of [facility], having more and more problems with his day to day activity.  He likes to play golf and cannot do that due to the pain. He has reduction in range of motion of the right hip and occasionally wakes him with night pain.  X-rays show bone and bone deformity with destruction oft he joint.  It was felt that this patient would need surgical intervention and was admitted for the above procedure.  He was cleared preoperatively by Dr. Cassell Clement, his internal medicine physician.  HOSPITAL COURSE:  [name] tolerated the surgical procedure quite well.  His positive attitude really helped him throughout his hospitalization as well as his  family support.  The Hemovac was discontinued without any difficulty. When the dressing was changed, it was found that the wound was very nicely healing well with no evidence of any drainage.  The patient did have some mild hyponatremia.  We discontinued his IVs and allowed hyponatremia to come back to a normal level.  The patients sodium levels improved minimally with this treatment.  If this continues, we will need to get Dr. Patty Sermons involved.  On the morning of discharge, the patient had ambulated quite well in the room and had fallen back against the bed.  He did not fall on the floor and he did not twist his extremity.  He had no marked increase in pain.  AP and lateral x-ray of the right hip showed no bony changes.  The patient had no increase in pain with range of motion of the right hip.  It was felt that he was quite safe to be discharged to the [facility] and arrangements were made for discharge.  I called Dr. Cassell Clement on the day of the patients discharge.  He stated that he would be more than happy to follow his Coumadin protocol.  The next PT drawn should be Monday and those will be faxed to Dr. Jenness Corner office.  LABORATORY DATA AND X-RAY FINDINGS:  Preoperative CBC was completely within normal limits, other than RDW at  14.6.  Blood chemistries preoperatively as well as postoperatively showed the hyponatremia, but otherwise essentially within normal limits.  Urinalysis was negative.  Urinary tract infection negative.  Final hemoglobin was 10.2, hematocrit 29.1.  Chest x-ray from Dr. Jenness Corner office showed no acute changes and electrocardiogram read by Dr. Patty Sermons showed sinus bradycardia, right bundle branch block (old) with no acute changes.  CONDITION ON DISCHARGE:  Improved and stable.  DISPOSITION:  The patient is transferred/discharged to [facility].  ACTIVITY:  Touchdown  weightbearing to the right lower extremity.  He is to continue with total hip protocol and precautions.  SPECIAL INSTRUCTIONS:  Cherlynn Polo may be removed two weeks after the date of surgery and Steri-Strips p.r.n.  All medications or medical questions should be directed to his family physician, Dr. Cassell Clement.  Dr. Patty Sermons has agreed to follow his Coumadin protocol which will be three weeks from the date of surgery.  The next PT draw is on Monday.  Those results are to be faxed to Dr. Jenness Corner office and he will decided on Coumadin dosage.  Meanwhile, he is to continue with 5 mg q.d.  The hyponatremia will need to be addressed by Dr. Patty Sermons at [facility], although it is extremely mild.  WOUND CARE:  Dry dressing p.r.n.  DIET:  Continue diet from hospital.  DISCHARGE MEDICATIONS:  Continue medications from hospital.  FOLLOWUP:  Return to see Dr. Lequita Halt in three weeks. DD:  07/04/00 TD:  07/04/00 Job: [id number]

## 2010-10-23 ENCOUNTER — Other Ambulatory Visit: Payer: Self-pay | Admitting: *Deleted

## 2010-10-23 DIAGNOSIS — Z79899 Other long term (current) drug therapy: Secondary | ICD-10-CM

## 2010-10-24 ENCOUNTER — Other Ambulatory Visit (INDEPENDENT_AMBULATORY_CARE_PROVIDER_SITE_OTHER): Payer: Medicare Other | Admitting: *Deleted

## 2010-10-24 ENCOUNTER — Encounter: Payer: Self-pay | Admitting: Cardiology

## 2010-10-24 ENCOUNTER — Telehealth: Payer: Self-pay | Admitting: *Deleted

## 2010-10-24 ENCOUNTER — Ambulatory Visit (INDEPENDENT_AMBULATORY_CARE_PROVIDER_SITE_OTHER): Payer: Medicare Other | Admitting: Cardiology

## 2010-10-24 DIAGNOSIS — I4891 Unspecified atrial fibrillation: Secondary | ICD-10-CM | POA: Insufficient documentation

## 2010-10-24 DIAGNOSIS — Z79899 Other long term (current) drug therapy: Secondary | ICD-10-CM

## 2010-10-24 DIAGNOSIS — I119 Hypertensive heart disease without heart failure: Secondary | ICD-10-CM | POA: Insufficient documentation

## 2010-10-24 DIAGNOSIS — I259 Chronic ischemic heart disease, unspecified: Secondary | ICD-10-CM

## 2010-10-24 LAB — BASIC METABOLIC PANEL
CO2: 30 mEq/L (ref 19–32)
Calcium: 9 mg/dL (ref 8.4–10.5)
GFR: 79.89 mL/min (ref >60.00)
Sodium: 135 mEq/L (ref 135–145)

## 2010-10-24 LAB — CBC WITH DIFFERENTIAL/PLATELET
Basophils Relative: 0.8 % (ref 0.0–3.0)
Eosinophils Relative: 1.2 % (ref 0.0–5.0)
Lymphocytes Relative: 23.3 % (ref 12.0–46.0)
MCHC: 33.9 g/dL (ref 30.0–36.0)
MCV: 101.8 fl — ABNORMAL HIGH (ref 78.0–100.0)
Monocytes Absolute: 0.4 10*3/uL (ref 0.1–1.0)
Monocytes Relative: 9.7 % (ref 3.0–12.0)
Neutro Abs: 2.6 10*3/uL (ref 1.4–7.7)
RDW: 16.3 % — ABNORMAL HIGH (ref 11.5–14.6)
WBC: 4 10*3/uL — ABNORMAL LOW (ref 4.5–10.5)

## 2010-10-24 LAB — HEPATIC FUNCTION PANEL
ALT: 24 U/L (ref 0–53)
Bilirubin, Direct: 0.3 mg/dL (ref 0.0–0.3)

## 2010-10-24 NOTE — Progress Notes (Signed)
[place] Date of Birth:  [birth year] [facility] [address]   [address] [place], [place]  [address] [phone]           Fax   [phone]  HPI: This pleasant 77 year old gentleman is seen for a scheduled followup office visit.  He has a history of atrial fibrillation.  His ventricular response is well controlled.  He's not had any symptoms of angina or exertional dyspnea or chest pain.  He's had no dizziness or syncope.  No TIA symptoms.  He has been taking a baby aspirin 3 times a week.  He is not on Coumadin because of a history of previous severe GI bleeds.  He has a history of a remote myocardial infarction in 1976.  He's had no recurrent angina.  He has a history of a left inguinal hernia.  He has seen his surgeon Dr. Ovidio Kin who felt that conservative therapy was appropriate.  Current Outpatient Prescriptions  Medication Sig Dispense Refill  . amLODipine (NORVASC) 5 MG tablet Take 5 mg by mouth daily.        Marland Kitchen aspirin 81 MG tablet Take 81 mg by mouth 3 (three) times a week.        . doxepin (SINEQUAN) 25 MG capsule Take 25 mg by mouth.        . fish oil-omega-3 fatty acids 1000 MG capsule Take by mouth daily.        . Multiple Vitamin (MULTIVITAMIN) tablet Take 1 tablet by mouth daily.        . Multiple Vitamins-Minerals (EYE VITAMINS PO) Take by mouth daily.        . nitroGLYCERIN (NITROSTAT) 0.4 MG SL tablet Place 0.4 mg under the tongue every 5 (five) minutes as needed.          No Known Allergies  Patient Active Problem List  Diagnoses  . Atrial fibrillation  . Benign hypertensive heart disease without heart failure  . Chronic ischemic heart disease    History  Smoking status  . Former Smoker  . Quit date: 05/20/1974  Smokeless tobacco  . Not on file    History  Alcohol Use No    No family history on file.  Review of Systems: The patient denies any heat or cold intolerance.  No weight gain or weight loss.  The  patient denies headaches or blurry vision.  There is no cough or sputum production.  The patient denies dizziness.  There is no hematuria or hematochezia.  The patient denies any muscle aches or arthritis.  The patient denies any rash.  The patient denies frequent falling or instability.  There is no history of depression or anxiety.  All other systems were reviewed and are negative.   Physical Exam: Filed Vitals:   10/24/10 1010  BP: 120/80  Pulse: 74  The general appearance reveals a well-developed well-nourished gentleman in no acute distress.Pupils equal and reactive.   Extraocular Movements are full.  There is no scleral icterus.  The mouth and pharynx are normal.  The neck is supple.  The carotids reveal no bruits.  The jugular venous pressure is normal.  The thyroid is not enlarged.  There is no lymphadenopathy.The chest is clear to percussion and auscultation. There are no rales or rhonchi. Expansion of the chest is symmetrical.The precordium is quiet.  The first heart sound is normal.  The second heart sound is physiologically split.  There is no murmur gallop rub or click.  There is no abnormal lift or heave. The rhythm is irregular.  The abdomen reveals a small left inguinal hernia which is easily present reducible.The pedal pulses are good.  There is no phlebitis or edema.  There is no cyanosis or clubbing.Strength is normal and symmetrical in all extremities.  There is no lateralizing weakness.  There are no sensory deficits.The skin is warm and dry.  There is no rash.    Assessment / Plan:  Continue same medication.  Recheck in 4 months for office visit and  lab work

## 2010-10-24 NOTE — Assessment & Plan Note (Signed)
The patient has a history of essential hypertension.  He is tolerating low dose amlodipine without difficulty.  He's having no dizziness or syncope.  No headaches.  He does have urinary frequency for which she sees Dr. Annabell Howells.

## 2010-10-24 NOTE — Assessment & Plan Note (Signed)
The patient has a history of atrial fibrillation with a controlled ventricular response.  He is not on Coumadin because of a history of recurrent GI bleeding.  He has been on an aspirin 3 times a week.  He has not noticed any evidence of GI bleeding.  He is not having any symptoms in regard to his atrial fibrillation.

## 2010-10-24 NOTE — Telephone Encounter (Signed)
Message copied by Lorayne Bender on Wed Oct 24, 2010  4:30 PM ------      Message from: Cassell Clement      Created: Wed Oct 24, 2010  2:43 PM       Please report that the lab work is satisfactory except that the platelet count is low so he should decrease his baby aspirin to just twice a week

## 2010-10-24 NOTE — Assessment & Plan Note (Signed)
Patient has a history of a remote myocardial infarction in 1976.  He has not had any recurrent angina pectoris.

## 2010-10-24 NOTE — Progress Notes (Signed)
Quick Note:  Please report that the lab work is satisfactory except that the platelet count is low so he should decrease his baby aspirin to just twice a week ______

## 2010-10-24 NOTE — Telephone Encounter (Signed)
Lm w/ lab results. Advised to call back,

## 2010-11-19 ENCOUNTER — Other Ambulatory Visit: Payer: Self-pay | Admitting: *Deleted

## 2010-11-19 DIAGNOSIS — I119 Hypertensive heart disease without heart failure: Secondary | ICD-10-CM

## 2010-11-19 MED ORDER — AMLODIPINE BESYLATE 5 MG PO TABS: 5.0000 mg | ORAL_TABLET | Freq: Every day | ORAL | Status: DC

## 2010-11-20 ENCOUNTER — Telehealth: Payer: Self-pay | Admitting: Cardiology

## 2010-11-20 NOTE — Telephone Encounter (Signed)
[place]  Came [place] last Thurs.   Coughing last night, concerned it will turn to pneumonia.  Has been seen by the nurses at [facility], they are not in today.  Still not feeling well.  Thinks he needs a xray of lungs.  Please call.

## 2010-11-20 NOTE — Telephone Encounter (Signed)
Will not rx zpak secondary to potential interaction with doxepin.  Will just use mucinex and advised to go to urgent care if no better

## 2010-11-20 NOTE — Telephone Encounter (Signed)
Received call from [place] at [facility] regarding patient.  Vitals within normal limits, including temp of 96.  Lungs sounded clear, no coughing with deep breaths.  Does have colored sputum at times. Will rx zpak and plain mucinex.  Advised to go to urgent care/emergency department if worse or develops a fever.  Patient to call back if no better

## 2010-11-22 ENCOUNTER — Telehealth: Payer: Self-pay | Admitting: Cardiology

## 2010-11-22 NOTE — Telephone Encounter (Signed)
Patient called earlier stating he was still feeling weak.  Advised he needed to go to urgent care if no better, refusing.  Did go to office today.  Denied any fever, just feels weak.  Just received call from John Sevier at [facility].  Vital sign within normal limits, no temp, what she hears in lungs almost sounds like bone on bone.  States she will have him reposition and listen again.  Denies any edema.  Advised her that he needs to go to urgent care and be checked.  She stated she would try to get him to go there

## 2010-12-10 ENCOUNTER — Telehealth: Payer: Self-pay | Admitting: Cardiology

## 2010-12-10 NOTE — Telephone Encounter (Signed)
Spoke with patient and he had not had a bowel movement in 4 days and had only taken two docusate sodium on Saturday.

## 2010-12-10 NOTE — Telephone Encounter (Signed)
Patient has called again. Please call back. Has taken laxatives and suppositories and they haven't worked.

## 2010-12-10 NOTE — Telephone Encounter (Signed)
He called with a constipation issue and wanted some advice about how to proceed. Please call back. I have pulled his chart.

## 2010-12-10 NOTE — Telephone Encounter (Signed)
Advised to try Miralax and increase water.

## 2010-12-16 NOTE — Telephone Encounter (Signed)
Agree with advice given

## 2010-12-17 NOTE — Telephone Encounter (Signed)
Spoke to patient he is continuing the Miralax as recommend by Dr Patty Sermons and will follow with Dr Matthias Hughs if response to Miralax inadequate.

## 2010-12-21 ENCOUNTER — Telehealth: Payer: Self-pay | Admitting: Cardiology

## 2010-12-21 NOTE — Telephone Encounter (Signed)
Spoke w/pt and daughter. Mr. Hammerschmidt has been taking Mira lax for 14 days and has had very little results. Thinks he has a blockage. Has been drinking a lot of fluids but has not taken any other laxatives. Per Dr. Patty Sermons needs to go to urgent care. Spoke w/ daughter and advised for him to go to [facility] urgent care and they can do abd xray to see if he does have a blockage or can go to ER. He lives at [facility]. She will have someone from there take him to urgent care. Advised that he probably does need to see Dr. Matthias Hughs but needs to get this resolved first. Can call us back and we can make referral. Pt states it has been 5-6 yrs since he was seen. Has hx of diverticulosis.

## 2010-12-21 NOTE — Telephone Encounter (Signed)
Daughter states father is on Mucelex and is having constipation that is not clearing up, daughter thinks he has a blockage and has been ongoing, pt's daughter states the pt would like to be seen today, pt is 77y/o with all his faculties and is having anxiety due to symptoms,  Please call pt's daughter back [phone] Cell #,  [phone] Home #

## 2010-12-21 NOTE — Telephone Encounter (Signed)
Pt states murilax is not working and not having bowel movements, pt would like to see Dr. Patty Sermons today or have him referred to Dr. Lucienne Capers, a gastro doctor, please call pt regarding this

## 2011-01-01 ENCOUNTER — Ambulatory Visit
Admission: RE | Admit: 2011-01-01 | Discharge: 2011-01-01 | Disposition: A | Discharge status: Home / Self Care | Payer: Medicare Other | Source: Ambulatory Visit | Attending: Gastroenterology | Admitting: Gastroenterology

## 2011-01-01 ENCOUNTER — Other Ambulatory Visit: Payer: Self-pay | Admitting: Gastroenterology

## 2011-01-14 ENCOUNTER — Ambulatory Visit (INDEPENDENT_AMBULATORY_CARE_PROVIDER_SITE_OTHER): Payer: Medicare Other | Admitting: *Deleted

## 2011-01-14 DIAGNOSIS — M79609 Pain in unspecified limb: Secondary | ICD-10-CM

## 2011-01-14 DIAGNOSIS — I83893 Varicose veins of bilateral lower extremities with other complications: Secondary | ICD-10-CM

## 2011-01-15 NOTE — Progress Notes (Signed)
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 This is what he has been wearing for ease of getting them on.

## 2011-02-07 LAB — CROSSMATCH

## 2011-02-07 LAB — COMPREHENSIVE METABOLIC PANEL
Albumin: 4
CO2: 30
Calcium: 9.6
Chloride: 95 — ABNORMAL LOW
Creatinine, Ser: 1.14
GFR calc Af Amer: >60
GFR calc non Af Amer: 60 — ABNORMAL LOW
Glucose, Bld: 102 — ABNORMAL HIGH
Total Bilirubin: 1.2

## 2011-02-07 LAB — URINALYSIS, ROUTINE W REFLEX MICROSCOPIC
Glucose, UA: NEGATIVE
Ketones, ur: NEGATIVE
Leukocytes, UA: NEGATIVE
Protein, ur: NEGATIVE
Urobilinogen, UA: 1

## 2011-02-07 LAB — BASIC METABOLIC PANEL
BUN: 19
CO2: 23
CO2: 25
CO2: 27
Chloride: 101
Chloride: 95 — ABNORMAL LOW
Chloride: 96
GFR calc Af Amer: >60
Glucose, Bld: 127 — ABNORMAL HIGH
Glucose, Bld: 88
Glucose, Bld: 98
Potassium: 3.5
Potassium: 3.6
Potassium: 4.1
Sodium: 126 — ABNORMAL LOW
Sodium: 127 — ABNORMAL LOW
Sodium: 130 — ABNORMAL LOW

## 2011-02-07 LAB — DIFFERENTIAL
Basophils Absolute: 0
Eosinophils Absolute: 0
Lymphocytes Relative: 15
Lymphs Abs: 1.1
Neutrophils Relative %: 78 — ABNORMAL HIGH

## 2011-02-07 LAB — CBC
HCT: 25.8 — ABNORMAL LOW
Hemoglobin: 13
MCV: 95.4
Platelets: 194
RDW: 14.7
WBC: 7.6

## 2011-02-07 LAB — PROTIME-INR
INR: 1
Prothrombin Time: 13.1

## 2011-02-08 LAB — HEMATOCRIT: HCT: 25.6 — ABNORMAL LOW

## 2011-04-03 ENCOUNTER — Ambulatory Visit: Payer: Medicare Other | Admitting: Cardiology

## 2011-04-17 ENCOUNTER — Encounter: Payer: Self-pay | Admitting: Cardiology

## 2011-04-17 ENCOUNTER — Ambulatory Visit (INDEPENDENT_AMBULATORY_CARE_PROVIDER_SITE_OTHER): Payer: Medicare Other | Admitting: Cardiology

## 2011-04-17 VITALS — BP 128/78 | HR 70 | Ht 67.0 in | Wt 140.0 lb

## 2011-04-17 DIAGNOSIS — I259 Chronic ischemic heart disease, unspecified: Secondary | ICD-10-CM

## 2011-04-17 DIAGNOSIS — I480 Paroxysmal atrial fibrillation: Secondary | ICD-10-CM

## 2011-04-17 DIAGNOSIS — K5900 Constipation, unspecified: Secondary | ICD-10-CM

## 2011-04-17 DIAGNOSIS — I4891 Unspecified atrial fibrillation: Secondary | ICD-10-CM

## 2011-04-17 DIAGNOSIS — I119 Hypertensive heart disease without heart failure: Secondary | ICD-10-CM

## 2011-04-17 DIAGNOSIS — K4090 Unilateral inguinal hernia, without obstruction or gangrene, not specified as recurrent: Secondary | ICD-10-CM

## 2011-04-17 LAB — CBC WITH DIFFERENTIAL/PLATELET
Basophils Relative: 0.9 % (ref 0.0–3.0)
Eosinophils Absolute: 0.1 10*3/uL (ref 0.0–0.7)
HCT: 41 % (ref 39.0–52.0)
Hemoglobin: 13.9 g/dL (ref 13.0–17.0)
Lymphs Abs: 1.1 10*3/uL (ref 0.7–4.0)
MCHC: 34 g/dL (ref 30.0–36.0)
MCV: 102.2 fl — ABNORMAL HIGH (ref 78.0–100.0)
Monocytes Absolute: 0.5 10*3/uL (ref 0.1–1.0)
Neutro Abs: 3.3 10*3/uL (ref 1.4–7.7)
RBC: 4.01 Mil/uL — ABNORMAL LOW (ref 4.22–5.81)

## 2011-04-17 LAB — BASIC METABOLIC PANEL
CO2: 29 mEq/L (ref 19–32)
Chloride: 100 mEq/L (ref 96–112)
Creatinine, Ser: 0.9 mg/dL (ref 0.4–1.5)
Potassium: 5.5 mEq/L — ABNORMAL HIGH (ref 3.5–5.1)
Sodium: 137 mEq/L (ref 135–145)

## 2011-04-17 NOTE — Patient Instructions (Signed)
Will obtain labs to day and call you with results Your physician recommends that you continue on your current medications as directed. Please refer to the Current Medication list given to you today.  Your physician recommends that you schedule a follow-up appointment in: 4 months with fasting labs (lp/bmet/hfp)

## 2011-04-17 NOTE — Progress Notes (Signed)
[place] [facility] Date of Birth:  [birth year] [facility] / [facility] [address]   [address] [place], [place]  [address] [phone]           Fax   [phone]  History of Present Illness: This pleasant 77 year old gentleman is seen for a scheduled followup office visit.  He has a remote history of ischemic heart disease.  He has a history of paroxysmal atrial flutter fibrillation.  He has a history of recurrent GI bleeds and so is not on Coumadin.  He does take a baby aspirin 3 times a week.  Has a history of essential hypertension.  Recently he had some problems with constipation and Dr. Matthias Hughs has placed her on MiraLax once or twice a day with good response.  The patient has had some foot pain and saw a podiatrist last week who did x-rays and noted that his bone structure was good and that there was no evidence of neuropathy and I suggested that he try leaving off his tight support hose and see if that would make a difference.  Current Outpatient Prescriptions  Medication Sig Dispense Refill  . amLODipine (NORVASC) 5 MG tablet Take 1 tablet (5 mg total) by mouth daily.  30 tablet  11  . aspirin 81 MG tablet Take 81 mg by mouth 3 (three) times a week.        . doxepin (SINEQUAN) 25 MG capsule Take 25 mg by mouth.        . fish oil-omega-3 fatty acids 1000 MG capsule Take by mouth daily.        . Multiple Vitamin (MULTIVITAMIN) tablet Take 1 tablet by mouth daily.        . Multiple Vitamins-Minerals (EYE VITAMINS PO) Take by mouth daily.        . nitroGLYCERIN (NITROSTAT) 0.4 MG SL tablet Place 0.4 mg under the tongue every 5 (five) minutes as needed.          No Known Allergies  Patient Active Problem List  Diagnoses  . Atrial fibrillation  . Benign hypertensive heart disease without heart failure  . Chronic ischemic heart disease    History  Smoking status  . Former Smoker  . Quit date: 05/20/1974  Smokeless tobacco  . Not on file    History  Alcohol  Use No    No family history on file.  Review of Systems: Constitutional: no fever chills diaphoresis or fatigue or change in weight.  Head and neck: no hearing loss, no epistaxis, no photophobia or visual disturbance. Respiratory: No cough, shortness of breath or wheezing. Cardiovascular: No chest pain peripheral edema, palpitations. Gastrointestinal: No abdominal distention, no abdominal pain, no change in bowel habits hematochezia or melena. Genitourinary: No dysuria, no frequency, no urgency, no nocturia. Musculoskeletal:No arthralgias, no back pain, no gait disturbance or myalgias. Neurological: No dizziness, no headaches, no numbness, no seizures, no syncope, no weakness, no tremors. Hematologic: No lymphadenopathy, no easy bruising. Psychiatric: No confusion, no hallucinations, no sleep disturbance.    Physical Exam: Filed Vitals:   04/17/11 1334  BP: 128/78  Pulse: 70   the general appearance is that of an elderly but very alert Caucasian male in no distress.Pupils equal and reactive.   Extraocular Movements are full.  There is no scleral icterus.  The mouth and pharynx are normal.  The neck is supple.  The carotids reveal no bruits.  The jugular venous pressure is normal.  The thyroid is not enlarged.  There  is no lymphadenopathy.  The chest is clear to percussion and auscultation. There are no rales or rhonchi. Expansion of the chest is symmetrical.  The precordium is quiet.  The first heart sound is normal.  The second heart sound is physiologically split.  There is no murmur gallop rub or click.  There is no abnormal lift or heave.  The abdomen is soft and nontender. Bowel sounds are normal. The liver and spleen are not enlarged. There Are no abdominal masses. There are no bruits.  There is a moderate left inguinal hernia which is easily reducible. The pedal pulses are good.  There is no phlebitis or edema.  There is no cyanosis or clubbing. Strength is normal and  symmetrical in all extremities.  There is no lateralizing weakness.  There are no sensory deficits.  The skin is warm and dry.  There is no rash.      Assessment / Plan: We are checking blood work on him today in the form of a CBC and a basal metabolic panel.  In 4 months for a followup office visit CBC basal metabolic panel and hepatic function panel.  He should also get a followup EKG at that time.

## 2011-04-17 NOTE — Assessment & Plan Note (Signed)
The patient's blood pressure has been stable on current regimen.  He exercises 3 times a week at the workout center at [facility].  He's not having any dizziness shortness of breath chest pain or syncope

## 2011-04-17 NOTE — Assessment & Plan Note (Signed)
The patient has had no TIA symptoms from his atrial fibrillation.  Not aware of any palpitations or tachycardia

## 2011-04-17 NOTE — Assessment & Plan Note (Signed)
Patient has been having recurrent chest pain or angina.  He has not had to take any sublingual nitroglycerin

## 2011-04-17 NOTE — Assessment & Plan Note (Signed)
The patient has a known left inguinal hernia.  It is easily reducible.  His surgeon has advised nonoperative treatment

## 2011-04-19 ENCOUNTER — Telehealth: Payer: Self-pay | Admitting: Cardiology

## 2011-04-19 ENCOUNTER — Telehealth: Payer: Self-pay | Admitting: *Deleted

## 2011-04-19 NOTE — Telephone Encounter (Signed)
Message copied by Burnell Blanks on Fri Apr 19, 2011  3:40 PM ------      Message from: Cassell Clement      Created: Wed Apr 17, 2011  7:35 PM       Please report.  The labs are stable.  Continue same meds.  Continue careful diet.  The potassium is slightly high so drink plenty of water and avoid too many high K foods.

## 2011-04-19 NOTE — Telephone Encounter (Signed)
Advised of labs 

## 2011-05-13 ENCOUNTER — Encounter (HOSPITAL_COMMUNITY): Payer: Self-pay | Admitting: Emergency Medicine

## 2011-05-13 ENCOUNTER — Inpatient Hospital Stay (HOSPITAL_COMMUNITY)
Admission: EM | Admit: 2011-05-13 | Discharge: 2011-05-23 | DRG: 871 | Disposition: A | Discharge status: Skilled Nursing Facility | Payer: Medicare Other | Attending: Internal Medicine | Admitting: Internal Medicine

## 2011-05-13 ENCOUNTER — Emergency Department (HOSPITAL_COMMUNITY): Payer: Medicare Other

## 2011-05-13 DIAGNOSIS — N400 Enlarged prostate without lower urinary tract symptoms: Secondary | ICD-10-CM | POA: Diagnosis present

## 2011-05-13 DIAGNOSIS — E860 Dehydration: Secondary | ICD-10-CM

## 2011-05-13 DIAGNOSIS — K5730 Diverticulosis of large intestine without perforation or abscess without bleeding: Secondary | ICD-10-CM | POA: Diagnosis present

## 2011-05-13 DIAGNOSIS — I119 Hypertensive heart disease without heart failure: Secondary | ICD-10-CM

## 2011-05-13 DIAGNOSIS — Z87891 Personal history of nicotine dependence: Secondary | ICD-10-CM

## 2011-05-13 DIAGNOSIS — D6959 Other secondary thrombocytopenia: Secondary | ICD-10-CM | POA: Diagnosis present

## 2011-05-13 DIAGNOSIS — R3915 Urgency of urination: Secondary | ICD-10-CM | POA: Diagnosis present

## 2011-05-13 DIAGNOSIS — N401 Enlarged prostate with lower urinary tract symptoms: Secondary | ICD-10-CM | POA: Diagnosis present

## 2011-05-13 DIAGNOSIS — J101 Influenza due to other identified influenza virus with other respiratory manifestations: Secondary | ICD-10-CM

## 2011-05-13 DIAGNOSIS — D696 Thrombocytopenia, unspecified: Secondary | ICD-10-CM | POA: Diagnosis present

## 2011-05-13 DIAGNOSIS — D72829 Elevated white blood cell count, unspecified: Secondary | ICD-10-CM | POA: Diagnosis present

## 2011-05-13 DIAGNOSIS — Z7982 Long term (current) use of aspirin: Secondary | ICD-10-CM

## 2011-05-13 DIAGNOSIS — Z79899 Other long term (current) drug therapy: Secondary | ICD-10-CM

## 2011-05-13 DIAGNOSIS — R4182 Altered mental status, unspecified: Secondary | ICD-10-CM

## 2011-05-13 DIAGNOSIS — R350 Frequency of micturition: Secondary | ICD-10-CM | POA: Diagnosis present

## 2011-05-13 DIAGNOSIS — H9190 Unspecified hearing loss, unspecified ear: Secondary | ICD-10-CM | POA: Diagnosis present

## 2011-05-13 DIAGNOSIS — I214 Non-ST elevation (NSTEMI) myocardial infarction: Secondary | ICD-10-CM | POA: Diagnosis present

## 2011-05-13 DIAGNOSIS — I959 Hypotension, unspecified: Secondary | ICD-10-CM | POA: Diagnosis present

## 2011-05-13 DIAGNOSIS — I252 Old myocardial infarction: Secondary | ICD-10-CM

## 2011-05-13 DIAGNOSIS — J189 Pneumonia, unspecified organism: Secondary | ICD-10-CM

## 2011-05-13 DIAGNOSIS — G9340 Encephalopathy, unspecified: Secondary | ICD-10-CM | POA: Diagnosis present

## 2011-05-13 DIAGNOSIS — N138 Other obstructive and reflux uropathy: Secondary | ICD-10-CM | POA: Diagnosis present

## 2011-05-13 DIAGNOSIS — I4891 Unspecified atrial fibrillation: Secondary | ICD-10-CM

## 2011-05-13 DIAGNOSIS — E871 Hypo-osmolality and hyponatremia: Secondary | ICD-10-CM | POA: Diagnosis not present

## 2011-05-13 DIAGNOSIS — I259 Chronic ischemic heart disease, unspecified: Secondary | ICD-10-CM | POA: Diagnosis present

## 2011-05-13 DIAGNOSIS — R5381 Other malaise: Secondary | ICD-10-CM | POA: Diagnosis present

## 2011-05-13 DIAGNOSIS — Z96649 Presence of unspecified artificial hip joint: Secondary | ICD-10-CM

## 2011-05-13 DIAGNOSIS — E876 Hypokalemia: Secondary | ICD-10-CM

## 2011-05-13 DIAGNOSIS — A419 Sepsis, unspecified organism: Principal | ICD-10-CM | POA: Diagnosis present

## 2011-05-13 DIAGNOSIS — J1100 Influenza due to unidentified influenza virus with unspecified type of pneumonia: Secondary | ICD-10-CM | POA: Diagnosis present

## 2011-05-13 HISTORY — DX: Benign prostatic hyperplasia without lower urinary tract symptoms: N40.0

## 2011-05-13 HISTORY — DX: Old myocardial infarction: I25.2

## 2011-05-13 HISTORY — DX: Diverticulitis of intestine, part unspecified, without perforation or abscess without bleeding: K57.92

## 2011-05-13 HISTORY — DX: Spinal stenosis, site unspecified: M48.00

## 2011-05-13 MED ORDER — SODIUM CHLORIDE 0.9 % IV SOLN
INTRAVENOUS | Status: DC
  Administered 2011-05-13: via INTRAVENOUS

## 2011-05-13 MED ORDER — ACETAMINOPHEN 325 MG PO TABS
650.0000 mg | ORAL_TABLET | Freq: Once | ORAL | Status: AC
  Administered 2011-05-13: 650 mg via ORAL
  Filled 2011-05-13: qty 2

## 2011-05-13 MED ORDER — SODIUM CHLORIDE 0.9 % IV BOLUS (SEPSIS): 500.0000 mL | Freq: Once | INTRAVENOUS | Status: DC

## 2011-05-13 NOTE — ED Provider Notes (Addendum)
History     CSN: [id number]  Arrival date & time 05/13/11  2230   First MD Initiated Contact with Patient 05/13/11 2257      Chief Complaint  Patient presents with  . Fever    on and off pneumonia, fever becoming worse today.    (Consider location/radiation/quality/duration/timing/severity/associated sxs/prior treatment) Patient is a 77 y.o. male presenting with fever. The history is provided by the patient.  Fever Primary symptoms of the febrile illness include fever, fatigue, cough, shortness of breath, dysuria and altered mental status. Primary symptoms do not include visual change, headaches, wheezing, abdominal pain, nausea, vomiting, diarrhea, myalgias, arthralgias or rash. The current episode started 2 days ago. This is a new problem. The problem has not changed since onset. The fever began today. The fever has been resolved since its onset. The maximum temperature recorded prior to his arrival was unknown.  The fatigue began today. The fatigue has been unchanged since its onset.  The cough began 2 days ago. The cough is non-productive.  The shortness of breath began yesterday. The shortness of breath developed gradually. The shortness of breath is mild.  The dysuria began today. The discomfort is felt in the penis. The discomfort is mild. The dysuria is associated with frequency and urgency. The dysuria is not associated with hematuria.  The change in mental status began today. The altered mental status developed gradually. The change in mental status has been unchanged since its onset. The change in mental status includes confusion and a disturbance of speech (Difficulty expressing himself with words).  Associated with: Recent illness and fever.    Past Medical History  Diagnosis Date  . Atrial fibrillation   . H/O: GI bleed   . Inguinal hernia     History reviewed. No pertinent past surgical history.  No family history on file.  History  Substance Use Topics  .  Smoking status: Former Smoker    Quit date: 05/20/1974  . Smokeless tobacco: Not on file  . Alcohol Use: No      Review of Systems  Constitutional: Positive for fever and fatigue.  HENT: Positive for hearing loss. Negative for ear pain, congestion, sore throat, rhinorrhea, drooling, trouble swallowing, neck pain, neck stiffness and voice change.        Chronic hearing impaired  Eyes: Negative for photophobia and redness.  Respiratory: Positive for cough and shortness of breath. Negative for wheezing.   Cardiovascular: Negative for chest pain.  Gastrointestinal: Negative for nausea, vomiting, abdominal pain and diarrhea.  Genitourinary: Positive for dysuria, urgency and frequency. Negative for hematuria.  Musculoskeletal: Negative for myalgias, back pain and arthralgias.  Skin: Negative for color change, pallor and rash.  Neurological: Negative for tremors, facial asymmetry, weakness and headaches.  Hematological: Does not bruise/bleed easily.  Psychiatric/Behavioral: Positive for confusion and altered mental status.    Allergies  Review of patient's allergies indicates no known allergies.  Home Medications   Current Outpatient Rx  Name Route Sig Dispense Refill  . AMLODIPINE BESYLATE 5 MG PO TABS Oral Take 1 tablet (5 mg total) by mouth daily. 30 tablet 11  . ASPIRIN 81 MG PO TABS Oral Take 81 mg by mouth 3 (three) times a week.      . DOXEPIN HCL 25 MG PO CAPS Oral Take 25 mg by mouth.      . OMEGA-3 FATTY ACIDS 1000 MG PO CAPS Oral Take by mouth daily.      Marland Kitchen ONE-DAILY MULTI VITAMINS PO TABS Oral  Take 1 tablet by mouth daily.      Marland Kitchen EYE VITAMINS PO Oral Take by mouth daily.      Marland Kitchen NITROGLYCERIN 0.4 MG SL SUBL Sublingual Place 0.4 mg under the tongue every 5 (five) minutes as needed.        BP 141/65  Pulse 100  Temp(Src) 99.3 F (37.4 C) (Oral)  Resp 18  SpO2 100%  Physical Exam  Nursing note and vitals reviewed. Constitutional: He is oriented to person, place, and  time. He appears well-developed and well-nourished. No distress.  HENT:  Head: Normocephalic and atraumatic.  Mouth/Throat: Oropharynx is clear and moist.  Eyes: Conjunctivae and EOM are normal. Pupils are equal, round, and reactive to light.  Neck: Normal range of motion.  Cardiovascular: Normal rate, regular rhythm, normal heart sounds and intact distal pulses.  Exam reveals no gallop and no friction rub.   No murmur heard. Pulmonary/Chest: Effort normal. No respiratory distress. He has no wheezes. He has rales. He exhibits no tenderness.       Rales at the left base posteriorly and at the right mid lung field  Abdominal: Soft. Bowel sounds are normal. He exhibits no distension. There is no tenderness. There is no rebound and no guarding.  Musculoskeletal: Normal range of motion. He exhibits no edema and no tenderness.  Neurological: He is alert and oriented to person, place, and time. He has normal reflexes. No cranial nerve deficit. He exhibits normal muscle tone. Coordination normal.       Stammering speech  Skin: Skin is warm and dry. No rash noted. He is not diaphoretic. No erythema. No pallor.  Psychiatric: He has a normal mood and affect. His behavior is normal. Thought content normal.    ED Course  Procedures (including critical care time)  Date: 05/14/2011  Rate: 114  Rhythm: sinus tachycardia  QRS Axis: left  Intervals: QT prolonged and Right bundle-branch block and left anterior fascicular block  ST/T Wave abnormalities: nonspecific ST/T changes  Conduction Disutrbances:right bundle branch block and left anterior fascicular block  Narrative Interpretation:   Old EKG Reviewed: changes noted    Labs Reviewed  CBC  DIFFERENTIAL  BASIC METABOLIC PANEL  URINALYSIS, ROUTINE W REFLEX MICROSCOPIC   No results found.   No diagnosis found.    MDM  Differential diagnosis includes infection, namely pneumonia or urinary tract infection, anemia, arrhythmia, electrolyte  abnormality, dehydration. On physical examination, the patient has no apparent cranial nerve deficits, no facial asymmetry, no pronator drift, no drift of the lower extremities, and full-strength symmetrically in the extremities and normal intact sensation. I do not suspect stroke, especially with reported fever and cough. I believe that the mental status changes which are mild are likely attributable to infection.        Felisa Bonier, MD 05/13/11 2349  Felisa Bonier, MD 05/14/11 [phone]

## 2011-05-13 NOTE — ED Notes (Signed)
ZOX:[id number]<AV> Expected date:05/13/11<BR> Expected time:10:10 PM<BR> Means of arrival:Ambulance<BR> Comments:<BR> EMS 22 PTAR, 89 yom weakness, fever, influenza

## 2011-05-13 NOTE — ED Notes (Signed)
Patient transported to X-ray 

## 2011-05-14 ENCOUNTER — Other Ambulatory Visit: Payer: Self-pay

## 2011-05-14 ENCOUNTER — Inpatient Hospital Stay (HOSPITAL_COMMUNITY): Payer: Medicare Other

## 2011-05-14 ENCOUNTER — Encounter (HOSPITAL_COMMUNITY): Payer: Self-pay | Admitting: Internal Medicine

## 2011-05-14 DIAGNOSIS — R7989 Other specified abnormal findings of blood chemistry: Secondary | ICD-10-CM

## 2011-05-14 DIAGNOSIS — I214 Non-ST elevation (NSTEMI) myocardial infarction: Secondary | ICD-10-CM

## 2011-05-14 DIAGNOSIS — J189 Pneumonia, unspecified organism: Secondary | ICD-10-CM | POA: Diagnosis present

## 2011-05-14 LAB — CBC
Hemoglobin: 11.7 g/dL — ABNORMAL LOW (ref 13.0–17.0)
MCH: 33.7 pg (ref 26.0–34.0)
MCHC: 34.9 g/dL (ref 30.0–36.0)
MCHC: 35 g/dL (ref 30.0–36.0)
MCHC: 35.3 g/dL (ref 30.0–36.0)
MCV: 96.4 fL (ref 78.0–100.0)
MCV: 97.4 fL (ref 78.0–100.0)
Platelets: 111 10*3/uL — ABNORMAL LOW (ref 150–400)
Platelets: 71 10*3/uL — ABNORMAL LOW (ref 150–400)
RBC: 3.45 MIL/uL — ABNORMAL LOW (ref 4.22–5.81)
RDW: 15.6 % — ABNORMAL HIGH (ref 11.5–15.5)
WBC: 6.7 10*3/uL (ref 4.0–10.5)
WBC: 13.9 10*3/uL — ABNORMAL HIGH (ref 4.0–10.5)

## 2011-05-14 LAB — COMPREHENSIVE METABOLIC PANEL
Albumin: 2.6 g/dL — ABNORMAL LOW (ref 3.5–5.2)
Alkaline Phosphatase: 55 U/L (ref 39–117)
BUN: 29 mg/dL — ABNORMAL HIGH (ref 6–23)
Chloride: 104 mEq/L (ref 96–112)
Glucose, Bld: 106 mg/dL — ABNORMAL HIGH (ref 70–99)
Potassium: 4.3 mEq/L (ref 3.5–5.1)
Total Bilirubin: 2 mg/dL — ABNORMAL HIGH (ref 0.3–1.2)

## 2011-05-14 LAB — GLUCOSE, CAPILLARY: Glucose-Capillary: 80 mg/dL (ref 70–99)

## 2011-05-14 LAB — URINALYSIS, ROUTINE W REFLEX MICROSCOPIC
Bilirubin Urine: NEGATIVE
Glucose, UA: NEGATIVE mg/dL
Nitrite: NEGATIVE
Protein, ur: 30 mg/dL — AB
Protein, ur: 30 mg/dL — AB
Urobilinogen, UA: 0.2 mg/dL (ref 0.0–1.0)
Urobilinogen, UA: 0.2 mg/dL (ref 0.0–1.0)

## 2011-05-14 LAB — DIFFERENTIAL
Eosinophils Absolute: 0 10*3/uL (ref 0.0–0.7)
Lymphs Abs: 0.5 10*3/uL — ABNORMAL LOW (ref 0.7–4.0)
Monocytes Absolute: 0.6 10*3/uL (ref 0.1–1.0)
Neutrophils Relative %: 91 % — ABNORMAL HIGH (ref 43–77)

## 2011-05-14 LAB — CARDIAC PANEL(CRET KIN+CKTOT+MB+TROPI)
CK, MB: 10.2 ng/mL (ref 0.3–4.0)
Troponin I: 0.45 ng/mL (ref <0.30)

## 2011-05-14 LAB — BLOOD GAS, ARTERIAL
Drawn by: 340271
O2 Content: 6 L/min
O2 Saturation: 93.2 %
Patient temperature: 98.6

## 2011-05-14 LAB — BASIC METABOLIC PANEL
Calcium: 9.5 mg/dL (ref 8.4–10.5)
Chloride: 103 mEq/L (ref 96–112)
Creatinine, Ser: 0.97 mg/dL (ref 0.50–1.35)
GFR calc Af Amer: 57 mL/min — ABNORMAL LOW (ref >90)
GFR calc non Af Amer: 67 mL/min — ABNORMAL LOW (ref >90)
GFR calc non Af Amer: 49 mL/min — ABNORMAL LOW (ref >90)
Glucose, Bld: 79 mg/dL (ref 70–99)
Glucose, Bld: 88 mg/dL (ref 70–99)
Potassium: 3.8 mEq/L (ref 3.5–5.1)
Sodium: 136 mEq/L (ref 135–145)
Sodium: 135 mEq/L (ref 135–145)

## 2011-05-14 LAB — PROCALCITONIN: Procalcitonin: 17.17 ng/mL

## 2011-05-14 LAB — URINE MICROSCOPIC-ADD ON

## 2011-05-14 LAB — LACTIC ACID, PLASMA: Lactic Acid, Venous: 2.3 mmol/L — ABNORMAL HIGH (ref 0.5–2.2)

## 2011-05-14 LAB — INFLUENZA PANEL BY PCR (TYPE A & B)
Influenza A By PCR: POSITIVE — AB
Influenza B By PCR: NEGATIVE

## 2011-05-14 MED ORDER — PIPERACILLIN-TAZOBACTAM IN DEX 2-0.25 GM/50ML IV SOLN: 2.2500 g | Freq: Three times a day (TID) | INTRAVENOUS | Status: DC

## 2011-05-14 MED ORDER — SODIUM CHLORIDE 0.9 % IV SOLN: INTRAVENOUS | Status: DC

## 2011-05-14 MED ORDER — SENNA 8.6 MG PO TABS
1.0000 | ORAL_TABLET | Freq: Two times a day (BID) | ORAL | Status: DC
  Administered 2011-05-14 – 2011-05-23 (×9): 8.6 mg via ORAL
  Filled 2011-05-14 (×10): qty 1

## 2011-05-14 MED ORDER — SODIUM CHLORIDE 0.9 % IV BOLUS (SEPSIS)
1000.0000 mL | Freq: Once | INTRAVENOUS | Status: AC
  Administered 2011-05-14: 1000 mL via INTRAVENOUS

## 2011-05-14 MED ORDER — OSELTAMIVIR PHOSPHATE 75 MG PO CAPS
75.0000 mg | ORAL_CAPSULE | Freq: Every day | ORAL | Status: DC
  Administered 2011-05-14 – 2011-05-18 (×5): 75 mg via ORAL
  Filled 2011-05-14 (×6): qty 1

## 2011-05-14 MED ORDER — HEPARIN SODIUM (PORCINE) 5000 UNIT/ML IJ SOLN
5000.0000 [IU] | Freq: Three times a day (TID) | INTRAMUSCULAR | Status: DC
  Administered 2011-05-14 (×2): 5000 [IU] via SUBCUTANEOUS
  Filled 2011-05-14 (×7): qty 1

## 2011-05-14 MED ORDER — VANCOMYCIN HCL IN DEXTROSE 1-5 GM/200ML-% IV SOLN: 1000.0000 mg | Freq: Two times a day (BID) | INTRAVENOUS | Status: DC

## 2011-05-14 MED ORDER — SODIUM CHLORIDE 0.9 % IV BOLUS (SEPSIS): 500.0000 mL | Freq: Once | INTRAVENOUS | Status: DC

## 2011-05-14 MED ORDER — ACETAMINOPHEN 325 MG PO TABS
650.0000 mg | ORAL_TABLET | Freq: Four times a day (QID) | ORAL | Status: DC | PRN
  Administered 2011-05-14 (×2): 650 mg via ORAL
  Filled 2011-05-14 (×2): qty 2

## 2011-05-14 MED ORDER — ASPIRIN 81 MG PO TABS
81.0000 mg | ORAL_TABLET | ORAL | Status: DC
  Administered 2011-05-14 – 2011-05-17 (×3): 81 mg via ORAL
  Filled 2011-05-14 (×4): qty 1

## 2011-05-14 MED ORDER — SODIUM CHLORIDE 0.9 % IV BOLUS (SEPSIS)
1000.0000 mL | INTRAVENOUS | Status: AC
  Administered 2011-05-14: 1000 mL via INTRAVENOUS

## 2011-05-14 MED ORDER — ONDANSETRON HCL 4 MG PO TABS: 4.0000 mg | ORAL_TABLET | Freq: Four times a day (QID) | ORAL | Status: DC | PRN

## 2011-05-14 MED ORDER — ACETAMINOPHEN 650 MG RE SUPP: 650.0000 mg | Freq: Four times a day (QID) | RECTAL | Status: DC | PRN

## 2011-05-14 MED ORDER — MOXIFLOXACIN HCL IN NACL 400 MG/250ML IV SOLN
400.0000 mg | Freq: Once | INTRAVENOUS | Status: AC
  Administered 2011-05-14: 400 mg via INTRAVENOUS
  Filled 2011-05-14: qty 250

## 2011-05-14 MED ORDER — ONDANSETRON HCL 4 MG/2ML IJ SOLN: 4.0000 mg | Freq: Four times a day (QID) | INTRAMUSCULAR | Status: DC | PRN

## 2011-05-14 MED ORDER — DOCUSATE SODIUM 100 MG PO CAPS
100.0000 mg | ORAL_CAPSULE | Freq: Two times a day (BID) | ORAL | Status: DC
  Administered 2011-05-14 – 2011-05-23 (×13): 100 mg via ORAL
  Filled 2011-05-14 (×20): qty 1

## 2011-05-14 MED ORDER — ALBUTEROL SULFATE (5 MG/ML) 0.5% IN NEBU
2.5000 mg | INHALATION_SOLUTION | Freq: Four times a day (QID) | RESPIRATORY_TRACT | Status: DC | PRN
  Administered 2011-05-16 – 2011-05-18 (×3): 2.5 mg via RESPIRATORY_TRACT
  Filled 2011-05-14 (×3): qty 0.5

## 2011-05-14 MED ORDER — MOXIFLOXACIN HCL IN NACL 400 MG/250ML IV SOLN
400.0000 mg | INTRAVENOUS | Status: DC
  Filled 2011-05-14: qty 250

## 2011-05-14 MED ORDER — DEXTROSE 5 % IV SOLN
1.0000 g | Freq: Once | INTRAVENOUS | Status: AC
  Administered 2011-05-14: 1 g via INTRAVENOUS
  Filled 2011-05-14: qty 10

## 2011-05-14 MED ORDER — GUAIFENESIN ER 600 MG PO TB12
600.0000 mg | ORAL_TABLET | Freq: Two times a day (BID) | ORAL | Status: DC
  Administered 2011-05-14 – 2011-05-23 (×19): 600 mg via ORAL
  Filled 2011-05-14 (×21): qty 1

## 2011-05-14 MED ORDER — VITAMINS A & D EX OINT
TOPICAL_OINTMENT | CUTANEOUS | Status: AC
  Administered 2011-05-14: 13:00:00
  Filled 2011-05-14: qty 5

## 2011-05-14 MED ORDER — SODIUM CHLORIDE 0.9 % IV SOLN
INTRAVENOUS | Status: DC
  Administered 2011-05-14 – 2011-05-15 (×4): via INTRAVENOUS

## 2011-05-14 MED ORDER — SODIUM CHLORIDE 0.9 % IV BOLUS (SEPSIS)
500.0000 mL | Freq: Once | INTRAVENOUS | Status: AC
  Administered 2011-05-14: 500 mL via INTRAVENOUS

## 2011-05-14 NOTE — H&P (Signed)
PCP:   Cassell Clement, MD, MD  Identifies him as his PCP [facility], spinal stenosis surgery  Chief Complaint:  Sore throat, cough, malaise  HPI: 77yoM with h/o AFib/flutter, NO coumadin due to recurrent GIB's, HTN, remote MI 1976  presents with SIRS criteria and multifocal PNA.   Pt is poor historian due to hearing issues and either minimally confused or has baseline  memory issues and difficulty expressing himself. However, he can relate that he's had  several days of sore throat and cough, and generally feels ill, with possibly fevers at  home but not too clear. He does deny myalgias or any aches, headaches, GI issues of  n/v/d/abd pain. He denies any SOB or CP.   In the ED, pt was febrile to 102.6, tachy up to 110, and BP ranged 101/51 to 141/65. O2 sat  was low 90's on 2L [place]. Chem panel including renal 22/0.97 was normal, glucose 79. WBC was  12.3 with 91% neutros, Hct 40.1, and plts 111. UA with trace ketones, 30 proteinuria, but  no signs of infxn. CXR showed multifocal L > R airspace disease with LLL dense  consolidation, with small left pleural effusion, called as multifocal PNA and less likely  to be asymmetric pulmonary edema and aspiration. Also of note, with R apical pleural  scarring but recommend f/u radiography. He has received ~500 cc's, tylenol, ceftriaxone,  and moxifloxacin.   ROS as above, o/w unremarkable.   Past Medical History  Diagnosis Date  . Atrial fibrillation     Not on Coumadin due to recurrent, severe GIB  . H/O: GI bleed   . Inguinal hernia   . Spinal stenosis     S/p surgery   . MI, old 38  . Diverticulitis   . BPH (benign prostatic hyperplasia)     Past Surgical History  Procedure Date  . Spine surgery     Spinal stenosis surgery with Dr. Darrelyn Hillock   . Cholecystectomy   . Total hip arthroplasty     Right    HOME MEDS: Unable to be reconciled  Prior to Admission medications   Medication  Sig Start Date End Date Taking? Authorizing Provider  amLODipine (NORVASC) 5 MG tablet Take 1 tablet (5 mg total) by mouth daily. 11/19/10  Yes Cassell Clement, MD  aspirin 81 MG tablet Take 81 mg by mouth 3 (three) times a week.     Yes Historical Provider, MD  doxepin (SINEQUAN) 25 MG capsule Take 25 mg by mouth.     Yes Historical Provider, MD  fish oil-omega-3 fatty acids 1000 MG capsule Take by mouth daily.     Yes Historical Provider, MD  Multiple Vitamin (MULTIVITAMIN) tablet Take 1 tablet by mouth daily.     Yes Historical Provider, MD  Multiple Vitamins-Minerals (EYE VITAMINS PO) Take by mouth daily.     Yes Historical Provider, MD  nitroGLYCERIN (NITROSTAT) 0.4 MG SL tablet Place 0.4 mg under the tongue every 5 (five) minutes as needed.      Historical Provider, MD   Allergies:  No Known Allergies  Social History:    reports that he quit smoking about 37 years ago. He does not have any smokeless tobacco history on file. He reports that he does not drink alcohol or use illicit drugs. He lives at [place] in an independent cottage. He has a 29 yo girlfriend, and has 3 children. He gets around with a walker. Cannot currently find any  telephone numbers for his children but he states his GF is aware he's in the hospital.   Family History: Family History  Problem Relation Age of Onset  . Heart attack Father   . Rheumatologic disease Mother    Physical Exam: Filed Vitals:   05/14/11 0005 05/14/11 0006 05/14/11 0156 05/14/11 0257  BP: 127/58 127/58 101/51 87/48  Pulse: 111  110 110  Temp: 102.6 F (39.2 C)     TempSrc: Oral     Resp: 23 24 21 21   Height:    5\' 7"  (1.702 m)  Weight:    60.782 kg (134 lb)  SpO2: 91%  92% 92%   Blood pressure 87/48, pulse 110, temperature 102.6 F (39.2 C), temperature source Oral, resp. rate 21, height 5\' 7"  (1.702 m), weight 60.782 kg (134 lb), SpO2 92.00%.  Gen: Very elderly, thin, frail M in no distress, moderately flushed but not toxic    appearing, alert and conversant, able to relate his histroy. Is hard of hearing, and has to  think a lot for certain answers either indicating confusion or memory issues.  HEENT: Pupils ~63mm, constrictive to light, EOMI, sclera clear. Mouth/tongue are quite dry  appearing.  Lungs: At bases up to mid-lung field are diffuse inspiratory light crackly-rales but no  wheezes, this is also heard on expiration. No wheezes though Heart: Extremely difficult to hear S1/2 Abd: Soft, scaphoid, non tender, non distended, no facial grimacing, normal exam Extrem: Thin, little bulk, but warm and perfusing, no coolness, cyanosis. BLE's have  hyperpigmented brown changes but no actual edema and are quite thin.  Neuro: Alert, conversant, CN 2-12 intact, moves around in bed on his own, moving extrems  spontaneously, grossly non-focal   Labs & Imaging Results for orders placed during the hospital encounter of 05/13/11 (from the past 48 hour(s))  CBC     Status: Abnormal   Collection Time   05/13/11 11:15 PM      Component Value Range Comment   WBC 12.3 (*) 4.0 - 10.5 (K/uL)    RBC 4.16 (*) 4.22 - 5.81 (MIL/uL)    Hemoglobin 14.0  13.0 - 17.0 (g/dL)    HCT 62.1  30.8 - 65.7 (%)    MCV 96.4  78.0 - 100.0 (fL)    MCH 33.7  26.0 - 34.0 (pg)    MCHC 34.9  30.0 - 36.0 (g/dL)    RDW 84.6  96.2 - 95.2 (%)    Platelets 111 (*) 150 - 400 (K/uL)   DIFFERENTIAL     Status: Abnormal   Collection Time   05/13/11 11:15 PM      Component Value Range Comment   Neutrophils Relative 91 (*) 43 - 77 (%)    Lymphocytes Relative 4 (*) 12 - 46 (%)    Monocytes Relative 5  3 - 12 (%)    Eosinophils Relative 0  0 - 5 (%)    Basophils Relative 0  0 - 1 (%)    Neutro Abs 11.2 (*) 1.7 - 7.7 (K/uL)    Lymphs Abs 0.5 (*) 0.7 - 4.0 (K/uL)    Monocytes Absolute 0.6  0.1 - 1.0 (K/uL)    Eosinophils Absolute 0.0  0.0 - 0.7 (K/uL)    Basophils Absolute 0.0  0.0 - 0.1 (K/uL)    Smear Review MORPHOLOGY UNREMARKABLE     BASIC  METABOLIC PANEL     Status: Abnormal   Collection Time   05/13/11 11:15 PM  Component Value Range Comment   Sodium 136  135 - 145 (mEq/L)    Potassium 4.3  3.5 - 5.1 (mEq/L)    Chloride 98  96 - 112 (mEq/L)    CO2 20  19 - 32 (mEq/L)    Glucose, Bld 79  70 - 99 (mg/dL)    BUN 22  6 - 23 (mg/dL)    Creatinine, Ser 1.61  0.50 - 1.35 (mg/dL)    Calcium 9.5  8.4 - 10.5 (mg/dL)    GFR calc non Af Amer 67 (*) >90 (mL/min)    GFR calc Af Amer 78 (*) >90 (mL/min)   URINALYSIS, ROUTINE W REFLEX MICROSCOPIC     Status: Abnormal   Collection Time   05/13/11 11:44 PM      Component Value Range Comment   Color, Urine YELLOW  YELLOW     APPearance CLOUDY (*) CLEAR     Specific Gravity, Urine 1.017  1.005 - 1.030     pH 5.5  5.0 - 8.0     Glucose, UA NEGATIVE  NEGATIVE (mg/dL)    Hgb urine dipstick LARGE (*) NEGATIVE     Bilirubin Urine NEGATIVE  NEGATIVE     Ketones, ur TRACE (*) NEGATIVE (mg/dL)    Protein, ur 30 (*) NEGATIVE (mg/dL)    Urobilinogen, UA 0.2  0.0 - 1.0 (mg/dL)    Nitrite NEGATIVE  NEGATIVE     Leukocytes, UA NEGATIVE  NEGATIVE    URINE MICROSCOPIC-ADD ON     Status: Abnormal   Collection Time   05/13/11 11:44 PM      Component Value Range Comment   Squamous Epithelial / LPF MANY (*) RARE     WBC, UA 0-2  <3 (WBC/hpf)    RBC / HPF 7-10  <3 (RBC/hpf)    Casts HYALINE CASTS (*) NEGATIVE     Dg Chest 2 View  05/14/2011  *RADIOLOGY REPORT*  Clinical Data: Fever.  Cough.  Pneumonia.  CHEST - 2 VIEW  Comparison: 09/13/2009.  Findings: Multi focal airspace disease is present in the right perihilar region, right base and left lower lobe.  This is suspicious for multifocal pneumonia.  Chronic lower thoracic compression fractures present.  Small left pleural effusion is probably parapneumonic.  Cardiopericardial silhouette and mediastinal contours appear within normal limits.  Asymmetric right pleural apical scarring. Radiographic follow-up is recommended to ensure clearing and  exclude an underlying lesion.  Radiographic clearing is usually observed at 4-6 weeks.  On the lateral view, right basilar airspace disease appears prominent in the right middle lobe.  IMPRESSION: Multi focal left greater than right air space disease with dense consolidation of the left lower lobe, most compatible with multifocal pneumonia.  Asymmetric/atypical pulmonary edema and aspiration considered less likely.  Original Report Authenticated By: Andreas Newport, M.D.   ECG: Sinus tachycardia, 114, LAD, RBBB with LAFB, bad baseline, no ST segment deviations, T  waves normal. In comparison to prior, RBBB with LAFB has emerged  Impression Present on Admission:  .Dehydration .Community acquired pneumonia .Atrial fibrillation .Benign hypertensive heart disease without heart failure .Chronic ischemic heart disease  77yoM with h/o AFib/flutter, NO coumadin due to recurrent GIB's, HTN, remote MI 1976  presents with SIRS criteria and multifocal PNA.   1. Mild hypoTN, fevers, leukocytosis, multifocal PNA: SBP 80's on arrival to the floor but pt  mentating quite well and this is lower than it was in the ED. He has SIRS but I don't feel  he's in full  blown shock and currently looks fair. Treat for CAP but low threshold to  broaden if not going in the right direction.   - Bolus 1L NS now, then maintenance, continue Moxifloxacin, sputum culture, BCx, mucinex  and PRN nebs, monitor UOP  - Follow up CXR is recommended by CXR read.    2. AFib: No current issue. Continue home ASA. Currently in sinus.  3. HTN: Holding amlodipine,  4. Home med rec: Holding doxepin, fish oil, MV,   Full code, but would not want prolonged course, per pt Regular bed, [facility] team 6  Other plans as per orders.  Janayia Burggraf 05/14/2011, 3:24 AM

## 2011-05-14 NOTE — Progress Notes (Signed)
eLink Physician-Brief Progress Note Patient Name: [name] DOB: 1911-12-24 MRN: [id number]  Date of Service  05/14/2011   HPI/Events of Note   High troponin alert in elink - call from bedside RN  eICU Interventions  D/w Bedside MD DR Verta Ellen- she has already called Cards. D/w her about low platelets - she will dc sq heparin.    Intervention Category Intermediate Interventions: Diagnostic test evaluation  [name] 05/14/2011, 6:09 PM

## 2011-05-14 NOTE — Progress Notes (Signed)
Dr Izola Price and intensivist notified of troponin 0.45 and CKMB 10.2. BP stable after a total of 5 L bolus ( I liter since transfer to ICU). bm per bedpan and 180 ml amber urine emptied from foley. Updates to daughter, [name] at bedside.

## 2011-05-14 NOTE — Consult Note (Signed)
Reason for Consult:Abnormal Cardiac Markers  Referring Physician: Dr. Vira Agar Franke Menter is an 77 y.o. male.   HPI: 77 y/o male with a PMH of paroxysmal AFIB, recurrent GI bleed secondary to diverticulosis, CAD s/p MI 1976 who was admitted to [facility] for septic shock.  He has been hypotensive since admission, but now normotensive after fluid resuscitation.  Cardiology consult was called because his CPK was 382, CKMB 10.2, troponin I of 0.45.  Patient denies any chest pain, shortness of breath, diaphoresis, PND or orthopnea.   Past Medical History  Diagnosis Date  . Atrial fibrillation     Not on Coumadin due to recurrent, severe GIB  . H/O: GI bleed   . Inguinal hernia   . Spinal stenosis     S/p surgery   . MI, old 89  . Diverticulitis   . BPH (benign prostatic hyperplasia)     Past Surgical History  Procedure Date  . Spine surgery     Spinal stenosis surgery with Dr. Darrelyn Hillock   . Cholecystectomy   . Total hip arthroplasty     Right     Family History  Problem Relation Age of Onset  . Heart attack Father   . Rheumatologic disease Mother     Social History:  reports that he quit smoking about 37 years ago. He does not have any smokeless tobacco history on file. He reports that he does not drink alcohol or use illicit drugs.  Allergies: No Known Allergies  Medications: Reviewed  Results for orders placed during the hospital encounter of 05/13/11 (from the past 48 hour(s))  CBC     Status: Abnormal   Collection Time   05/13/11 11:15 PM      Component Value Range Comment   WBC 12.3 (*) 4.0 - 10.5 (K/uL)    RBC 4.16 (*) 4.22 - 5.81 (MIL/uL)    Hemoglobin 14.0  13.0 - 17.0 (g/dL)    HCT 40.9  81.1 - 91.4 (%)    MCV 96.4  78.0 - 100.0 (fL)    MCH 33.7  26.0 - 34.0 (pg)    MCHC 34.9  30.0 - 36.0 (g/dL)    RDW 78.2  95.6 - 21.3 (%)    Platelets 111 (*) 150 - 400 (K/uL)   DIFFERENTIAL     Status: Abnormal   Collection Time   05/13/11 11:15 PM   Component Value Range Comment   Neutrophils Relative 91 (*) 43 - 77 (%)    Lymphocytes Relative 4 (*) 12 - 46 (%)    Monocytes Relative 5  3 - 12 (%)    Eosinophils Relative 0  0 - 5 (%)    Basophils Relative 0  0 - 1 (%)    Neutro Abs 11.2 (*) 1.7 - 7.7 (K/uL)    Lymphs Abs 0.5 (*) 0.7 - 4.0 (K/uL)    Monocytes Absolute 0.6  0.1 - 1.0 (K/uL)    Eosinophils Absolute 0.0  0.0 - 0.7 (K/uL)    Basophils Absolute 0.0  0.0 - 0.1 (K/uL)    Smear Review MORPHOLOGY UNREMARKABLE     BASIC METABOLIC PANEL     Status: Abnormal   Collection Time   05/13/11 11:15 PM      Component Value Range Comment   Sodium 136  135 - 145 (mEq/L)    Potassium 4.3  3.5 - 5.1 (mEq/L)    Chloride 98  96 - 112 (mEq/L)    CO2 20  19 -  32 (mEq/L)    Glucose, Bld 79  70 - 99 (mg/dL)    BUN 22  6 - 23 (mg/dL)    Creatinine, Ser 2.95  0.50 - 1.35 (mg/dL)    Calcium 9.5  8.4 - 10.5 (mg/dL)    GFR calc non Af Amer 67 (*) >90 (mL/min)    GFR calc Af Amer 78 (*) >90 (mL/min)   URINALYSIS, ROUTINE W REFLEX MICROSCOPIC     Status: Abnormal   Collection Time   05/13/11 11:44 PM      Component Value Range Comment   Color, Urine YELLOW  YELLOW     APPearance CLOUDY (*) CLEAR     Specific Gravity, Urine 1.017  1.005 - 1.030     pH 5.5  5.0 - 8.0     Glucose, UA NEGATIVE  NEGATIVE (mg/dL)    Hgb urine dipstick LARGE (*) NEGATIVE     Bilirubin Urine NEGATIVE  NEGATIVE     Ketones, ur TRACE (*) NEGATIVE (mg/dL)    Protein, ur 30 (*) NEGATIVE (mg/dL)    Urobilinogen, UA 0.2  0.0 - 1.0 (mg/dL)    Nitrite NEGATIVE  NEGATIVE     Leukocytes, UA NEGATIVE  NEGATIVE    URINE MICROSCOPIC-ADD ON     Status: Abnormal   Collection Time   05/13/11 11:44 PM      Component Value Range Comment   Squamous Epithelial / LPF MANY (*) RARE     WBC, UA 0-2  <3 (WBC/hpf)    RBC / HPF 7-10  <3 (RBC/hpf)    Casts HYALINE CASTS (*) NEGATIVE    INFLUENZA PANEL BY PCR     Status: Abnormal   Collection Time   05/14/11  3:25 AM      Component  Value Range Comment   Influenza A By PCR POSITIVE (*) NEGATIVE     Influenza B By PCR NEGATIVE  NEGATIVE     H1N1 flu by pcr NOT DETECTED  NOT DETECTED    CBC     Status: Abnormal   Collection Time   05/14/11  4:53 AM      Component Value Range Comment   WBC 6.7  4.0 - 10.5 (K/uL)    RBC 3.45 (*) 4.22 - 5.81 (MIL/uL)    Hemoglobin 11.7 (*) 13.0 - 17.0 (g/dL)    HCT 62.1 (*) 30.8 - 52.0 (%)    MCV 96.8  78.0 - 100.0 (fL)    MCH 33.9  26.0 - 34.0 (pg)    MCHC 35.0  30.0 - 36.0 (g/dL)    RDW 65.7  84.6 - 96.2 (%)    Platelets 84 (*) 150 - 400 (K/uL)   BASIC METABOLIC PANEL     Status: Abnormal   Collection Time   05/14/11  4:53 AM      Component Value Range Comment   Sodium 135  135 - 145 (mEq/L)    Potassium 3.8  3.5 - 5.1 (mEq/L)    Chloride 103  96 - 112 (mEq/L)    CO2 23  19 - 32 (mEq/L)    Glucose, Bld 88  70 - 99 (mg/dL)    BUN 24 (*) 6 - 23 (mg/dL)    Creatinine, Ser 9.52  0.50 - 1.35 (mg/dL)    Calcium 8.1 (*) 8.4 - 10.5 (mg/dL)    GFR calc non Af Amer 49 (*) >90 (mL/min)    GFR calc Af Amer 57 (*) >90 (mL/min)   GLUCOSE, CAPILLARY  Status: Normal   Collection Time   05/14/11  8:22 AM      Component Value Range Comment   Glucose-Capillary 80  70 - 99 (mg/dL)   LACTIC ACID, PLASMA     Status: Abnormal   Collection Time   05/14/11  9:12 AM      Component Value Range Comment   Lactic Acid, Venous 2.3 (*) 0.5 - 2.2 (mmol/L)   PROCALCITONIN     Status: Normal   Collection Time   05/14/11  9:14 AM      Component Value Range Comment   Procalcitonin 17.17     CORTISOL     Status: Normal   Collection Time   05/14/11  9:14 AM      Component Value Range Comment   Cortisol, Plasma 51.7     CARDIAC PANEL(CRET KIN+CKTOT+MB+TROPI)     Status: Abnormal   Collection Time   05/14/11  9:14 AM      Component Value Range Comment   Total CK 230  7 - 232 (U/L)    CK, MB 5.4 (*) 0.3 - 4.0 (ng/mL)    Troponin I <0.30  <0.30 (ng/mL)    Relative Index 2.3  0.0 - 2.5      URINALYSIS, ROUTINE W REFLEX MICROSCOPIC     Status: Abnormal   Collection Time   05/14/11  9:53 AM      Component Value Range Comment   Color, Urine AMBER (*) YELLOW  BIOCHEMICALS MAY BE AFFECTED BY COLOR   APPearance CLOUDY (*) CLEAR     Specific Gravity, Urine 1.020  1.005 - 1.030     pH 6.0  5.0 - 8.0     Glucose, UA NEGATIVE  NEGATIVE (mg/dL)    Hgb urine dipstick SMALL (*) NEGATIVE     Bilirubin Urine NEGATIVE  NEGATIVE     Ketones, ur TRACE (*) NEGATIVE (mg/dL)    Protein, ur 30 (*) NEGATIVE (mg/dL)    Urobilinogen, UA 0.2  0.0 - 1.0 (mg/dL)    Nitrite NEGATIVE  NEGATIVE     Leukocytes, UA NEGATIVE  NEGATIVE    URINE MICROSCOPIC-ADD ON     Status: Abnormal   Collection Time   05/14/11  9:53 AM      Component Value Range Comment   Squamous Epithelial / LPF FEW (*) RARE     WBC, UA 3-6  <3 (WBC/hpf) WITH CLUMPS   RBC / HPF 7-10  <3 (RBC/hpf)    Bacteria, UA MANY (*) RARE     Casts HYALINE CASTS (*) NEGATIVE     Urine-Other MUCOUS PRESENT     BLOOD GAS, ARTERIAL     Status: Abnormal   Collection Time   05/14/11 11:24 AM      Component Value Range Comment   O2 Content 6.0      Delivery systems NASAL CANNULA      pH, Arterial 7.388  7.350 - 7.450     pCO2 arterial 27.4 (*) 35.0 - 45.0 (mmHg)    pO2, Arterial 67.0 (*) 80.0 - 100.0 (mmHg)    Bicarbonate 16.1 (*) 20.0 - 24.0 (mEq/L)    TCO2 14.7  0 - 100 (mmol/L)    Acid-base deficit 7.2 (*) 0.0 - 2.0 (mmol/L)    O2 Saturation 93.2      Patient temperature 98.6      Collection site LEFT BRACHIAL      Drawn by [id number]      Sample type ARTERIAL DRAW  Allens test (pass/fail) PASS  PASS    CARDIAC PANEL(CRET KIN+CKTOT+MB+TROPI)     Status: Abnormal   Collection Time   05/14/11  4:20 PM      Component Value Range Comment   Total CK 382 (*) 7 - 232 (U/L)    CK, MB 10.2 (*) 0.3 - 4.0 (ng/mL)    Troponin I 0.45 (*) <0.30 (ng/mL)    Relative Index 2.7 (*) 0.0 - 2.5    COMPREHENSIVE METABOLIC PANEL     Status: Abnormal    Collection Time   05/14/11  4:20 PM      Component Value Range Comment   Sodium 133 (*) 135 - 145 (mEq/L)    Potassium 4.3  3.5 - 5.1 (mEq/L)    Chloride 104  96 - 112 (mEq/L)    CO2 22  19 - 32 (mEq/L)    Glucose, Bld 106 (*) 70 - 99 (mg/dL)    BUN 29 (*) 6 - 23 (mg/dL)    Creatinine, Ser 1.61  0.50 - 1.35 (mg/dL)    Calcium 7.6 (*) 8.4 - 10.5 (mg/dL)    Total Protein 5.9 (*) 6.0 - 8.3 (g/dL)    Albumin 2.6 (*) 3.5 - 5.2 (g/dL)    AST 92 (*) 0 - 37 (U/L)    ALT 63 (*) 0 - 53 (U/L)    Alkaline Phosphatase 55  39 - 117 (U/L)    Total Bilirubin 2.0 (*) 0.3 - 1.2 (mg/dL)    GFR calc non Af Amer 47 (*) >90 (mL/min)    GFR calc Af Amer 55 (*) >90 (mL/min)   CBC     Status: Abnormal   Collection Time   05/14/11  4:20 PM      Component Value Range Comment   WBC 13.9 (*) 4.0 - 10.5 (K/uL)    RBC 3.46 (*) 4.22 - 5.81 (MIL/uL)    Hemoglobin 11.9 (*) 13.0 - 17.0 (g/dL)    HCT 09.6 (*) 04.5 - 52.0 (%)    MCV 97.4  78.0 - 100.0 (fL)    MCH 34.4 (*) 26.0 - 34.0 (pg)    MCHC 35.3  30.0 - 36.0 (g/dL)    RDW 40.9 (*) 81.1 - 15.5 (%)    Platelets 71 (*) 150 - 400 (K/uL)   MRSA PCR SCREENING     Status: Normal   Collection Time   05/14/11  4:20 PM      Component Value Range Comment   MRSA by PCR NEGATIVE  NEGATIVE      Dg Chest 2 View  05/14/2011  *RADIOLOGY REPORT*  Clinical Data: Fever.  Cough.  Pneumonia.  CHEST - 2 VIEW  Comparison: 09/13/2009.  Findings: Multi focal airspace disease is present in the right perihilar region, right base and left lower lobe.  This is suspicious for multifocal pneumonia.  Chronic lower thoracic compression fractures present.  Small left pleural effusion is probably parapneumonic.  Cardiopericardial silhouette and mediastinal contours appear within normal limits.  Asymmetric right pleural apical scarring. Radiographic follow-up is recommended to ensure clearing and exclude an underlying lesion.  Radiographic clearing is usually observed at 4-6 weeks.  On the  lateral view, right basilar airspace disease appears prominent in the right middle lobe.  IMPRESSION: Multi focal left greater than right air space disease with dense consolidation of the left lower lobe, most compatible with multifocal pneumonia.  Asymmetric/atypical pulmonary edema and aspiration considered less likely.  Original Report Authenticated By: Andreas Newport, M.D.   Ct  Head Wo Contrast  05/14/2011  *RADIOLOGY REPORT*  Clinical Data: Confusion, disoriented, fever  CT HEAD WITHOUT CONTRAST  Technique:  Contiguous axial images were obtained from the base of the skull through the vertex without contrast.  Comparison: 08/31/2007  Findings: Diffuse brain atrophy and chronic microvascular ischemic changes in the periventricular and subcortical white matter of both cerebral hemispheres.  No significant interval change.  No acute intracranial hemorrhage, definite acute infarction, focal edema, mass lesion, midline shift, herniation, hydrocephalus, or extra- axial fluid collection.  Cisterns patent.  No orbital abnormality. Mastoids and sinuses clear.  IMPRESSION: Stable atrophy and microvascular ischemic changes.  No acute process by noncontrast CT.  Original Report Authenticated By: Judie Petit. Ruel Favors, M.D.    Review of Systems  Constitutional: Negative.   HENT: Negative.   Eyes: Negative.   Respiratory: Negative.   Cardiovascular: Negative.   Gastrointestinal: Negative.   Musculoskeletal: Negative.   Skin: Negative.   Neurological: Negative.   Endo/Heme/Allergies: Negative.    Blood pressure 112/62, pulse 85, temperature 97.6 F (36.4 C), temperature source Oral, resp. rate 24, height 5\' 7"  (1.702 m), weight 60.782 kg (134 lb), SpO2 96.00%. Physical Exam  Constitutional: He is oriented to person, place, and time. He appears well-developed. No distress.  HENT:  Head: Normocephalic.  Eyes: EOM are normal.  Neck: Normal range of motion. Neck supple.  Cardiovascular: An irregularly  irregular rhythm present. Exam reveals no S3 and no friction rub.   Respiratory: Effort normal.  GI: Soft. Normal appearance and bowel sounds are normal.  Musculoskeletal: Normal range of motion.       Right shoulder: He exhibits no swelling.  Neurological: He is alert and oriented to person, place, and time.  Skin: He is not diaphoretic.    Assessment:  1. NSTEMI: likely due to demand ischemia from hypotension 2.  Paroxysmal AFIB  3. History of recurrent GI bleed  Plan  1.  Low dose aspirin 81 mg as you are already doing 2.  Low dose metoprolol 12.5 mg twice daily (when patient is not hypotensive) 3.  Due to recurrent GI bleed, will not recommend Plavix or heparin 4.  A conservative medical therapy is warranted due to his age and co-morbidities 5.  2-D-echo in a.m. 6. Fasting lipids and LFTs  Quang Thorpe E 05/14/2011, 7:46 PM

## 2011-05-14 NOTE — Progress Notes (Signed)
Patient ID: [name], male   DOB: [birth year], 77 y.o.   MRN: [id number]  Subjective: No events overnight. Change in mental status per nurse, pt agitated more since this AM. Patient denies chest pain, shortness of breath, abdominal pain. Had bowel movement.  Objective:  Vital signs in last 24 hours:  Filed Vitals:   05/14/11 0257 05/14/11 0512 05/14/11 0810 05/14/11 0901  BP: 87/48 86/57 55/25  68/38  Pulse: 110 110  45  Temp:  98.9 F (37.2 C)  98.7 F (37.1 C)  TempSrc:  Oral  Oral  Resp: 21 18  22   Height: 5\' 7"  (1.702 m)     Weight: 60.782 kg (134 lb)     SpO2: 92%   92%    Intake/Output from previous day:   Intake/Output Summary (Last 24 hours) at 05/14/11 1020 Last data filed at 05/14/11 0700  Gross per 24 hour  Intake 2531.25 ml  Output      0 ml  Net 2531.25 ml    Physical Exam: General: Alert, awake, oriented to name only but take long time to answer questions, appears somewhat agitated because he wants to get up and is not able to do so. HEENT: No bruits, no goiter. Dry mucous membranes, no scleral icterus, no conjunctival pallor. Heart: Regular rate and rhythm, S1/S2 + but very distant heart sounds, no murmurs, rubs, gallops. Lungs: Rhonchorous breath sounds mostly at bases bilaterally. No wheezing, no rhonchi, no rales.  Abdomen: Soft, nontender, nondistended, positive bowel sounds. Extremities: No clubbing or cyanosis, no pitting edema,  positive pedal pulses. Neuro: Pt is awake and follows commands appropriately and he also answers most of the questions appropriately but takes a long time to think of the answer.  Lab Results:  Basic Metabolic Panel:    Component Value Date/Time   NA 135 05/14/2011 0453   K 3.8 05/14/2011 0453   CL 103 05/14/2011 0453   CO2 23 05/14/2011 0453   BUN 24* 05/14/2011 0453   CREATININE 1.19 05/14/2011 0453   GLUCOSE 88 05/14/2011 0453   CALCIUM 8.1* 05/14/2011 0453   CBC:    Component Value Date/Time   WBC  6.7 05/14/2011 0453   HGB 11.7* 05/14/2011 0453   HCT 33.4* 05/14/2011 0453   PLT 84* 05/14/2011 0453   MCV 96.8 05/14/2011 0453   NEUTROABS 11.2* 05/13/2011 2315   LYMPHSABS 0.5* 05/13/2011 2315   MONOABS 0.6 05/13/2011 2315   EOSABS 0.0 05/13/2011 2315   BASOSABS 0.0 05/13/2011 2315      Lab 05/14/11 0453 05/13/11 2315  WBC 6.7 12.3*  HGB 11.7* 14.0  HCT 33.4* 40.1  PLT 84* 111*  MCV 96.8 96.4  MCH 33.9 33.7  MCHC 35.0 34.9  RDW 15.3 15.4  LYMPHSABS -- 0.5*  MONOABS -- 0.6  EOSABS -- 0.0  BASOSABS -- 0.0  BANDABS -- --    Lab 05/14/11 0453 05/13/11 2315  NA 135 136  K 3.8 4.3  CL 103 98  CO2 23 20  GLUCOSE 88 79  BUN 24* 22  CREATININE 1.19 0.97  CALCIUM 8.1* 9.5  MG -- --   No results found for this basename: INR:5,PROTIME:5 in the last 168 hours Cardiac markers:  Lab 05/14/11 0914  CKMB 5.4*  TROPONINI <0.30  MYOGLOBIN --    Studies/Results:  Dg Chest 2 View 05/14/2011     IMPRESSION: Multi focal left greater than right air space disease with dense consolidation of the left lower lobe, most compatible with multifocal  pneumonia.  Asymmetric/atypical pulmonary edema and aspiration considered less likely.     Medications: Scheduled Meds:   . acetaminophen  650 mg Oral Once  . aspirin  81 mg Oral 3 times weekly  . cefTRIAXone (ROCEPHIN)  IV  1 g Intravenous Once  . docusate sodium  100 mg Oral BID  . guaiFENesin  600 mg Oral BID  . heparin  5,000 Units Subcutaneous Q8H  . moxifloxacin  400 mg Intravenous Once  . moxifloxacin  400 mg Intravenous Q24H  . oseltamivir  75 mg Oral Daily  . senna  1 tablet Oral BID  . sodium chloride  1,000 mL Intravenous Once  . sodium chloride  1,000 mL Intravenous Once  . sodium chloride  1,000 mL Intravenous STAT  . sodium chloride  500 mL Intravenous Once  . DISCONTD: sodium chloride   Intravenous STAT  . DISCONTD: sodium chloride  500 mL Intravenous Once   Continuous Infusions:   . sodium chloride 125  mL/hr at 05/14/11 0509  . DISCONTD: sodium chloride 125 mL/hr at 05/13/11 2353   PRN Meds:.acetaminophen, acetaminophen, albuterol, ondansetron (ZOFRAN) IV, ondansetron  Assessment/Plan:  Principal Problem:  *Change in mental state - in the setting of low blood pressure and poorly responsive to 1 L NS bolus - pt mostly appears to be agitated because he wants to get up and is not able to but more serious acte event can not be ruled out at this time, CVA, ACS, sepsis from an infectious etiology, PNA - will transfer to step down unit since the etiology of this sudden change is not clear - give additional 1.5 L bolus NS and continue at 125 cc/hr - electrolyte panel looks normal and at pt's baseline this AM - obtain CE's, procalcitonin level, lactic acid, random cortisol level - check ABG and 12 lead EKG - note that test positive for Influenza A, start Tamiflu - obtain CT head without contrast  Active Problems:  Atrial fibrillation - now tachycardic but in sinus rhythm - continue IVF for now and monitor   Community acquired pneumonia - change antibiotics to broad spectrum   EDUCATION - test results and diagnostic studies were discussed with patient and pt's family over the phone - patient and family have verbalized the understanding - questions were answered at the bedside and contact information was provided for additional questions or concerns   LOS: 1 day   MAGICK-Wes Lezotte 05/14/2011, 10:20 AM  [facility] Pager: [phone]

## 2011-05-15 ENCOUNTER — Inpatient Hospital Stay (HOSPITAL_COMMUNITY): Payer: Medicare Other

## 2011-05-15 ENCOUNTER — Other Ambulatory Visit: Payer: Self-pay

## 2011-05-15 DIAGNOSIS — I359 Nonrheumatic aortic valve disorder, unspecified: Secondary | ICD-10-CM

## 2011-05-15 DIAGNOSIS — J189 Pneumonia, unspecified organism: Secondary | ICD-10-CM

## 2011-05-15 DIAGNOSIS — G9340 Encephalopathy, unspecified: Secondary | ICD-10-CM | POA: Diagnosis present

## 2011-05-15 DIAGNOSIS — J101 Influenza due to other identified influenza virus with other respiratory manifestations: Secondary | ICD-10-CM | POA: Diagnosis present

## 2011-05-15 DIAGNOSIS — A419 Sepsis, unspecified organism: Secondary | ICD-10-CM | POA: Diagnosis present

## 2011-05-15 DIAGNOSIS — D696 Thrombocytopenia, unspecified: Secondary | ICD-10-CM | POA: Diagnosis present

## 2011-05-15 LAB — COMPREHENSIVE METABOLIC PANEL
BUN: 32 mg/dL — ABNORMAL HIGH (ref 6–23)
CO2: 18 mEq/L — ABNORMAL LOW (ref 19–32)
Calcium: 7.9 mg/dL — ABNORMAL LOW (ref 8.4–10.5)
Chloride: 106 mEq/L (ref 96–112)
Creatinine, Ser: 1.04 mg/dL (ref 0.50–1.35)
GFR calc Af Amer: 67 mL/min — ABNORMAL LOW (ref >90)
GFR calc non Af Amer: 58 mL/min — ABNORMAL LOW (ref >90)
Glucose, Bld: 74 mg/dL (ref 70–99)
Total Bilirubin: 1.8 mg/dL — ABNORMAL HIGH (ref 0.3–1.2)

## 2011-05-15 LAB — CARDIAC PANEL(CRET KIN+CKTOT+MB+TROPI)
CK, MB: 20.4 ng/mL (ref 0.3–4.0)
Total CK: 534 U/L — ABNORMAL HIGH (ref 7–232)
Troponin I: 0.74 ng/mL (ref <0.30)

## 2011-05-15 LAB — GLUCOSE, CAPILLARY

## 2011-05-15 LAB — BASIC METABOLIC PANEL
CO2: 18 mEq/L — ABNORMAL LOW (ref 19–32)
Calcium: 7.5 mg/dL — ABNORMAL LOW (ref 8.4–10.5)
GFR calc Af Amer: 59 mL/min — ABNORMAL LOW (ref >90)
GFR calc non Af Amer: 51 mL/min — ABNORMAL LOW (ref >90)
Sodium: 131 mEq/L — ABNORMAL LOW (ref 135–145)

## 2011-05-15 LAB — CBC
MCH: 34.2 pg — ABNORMAL HIGH (ref 26.0–34.0)
MCHC: 35.4 g/dL (ref 30.0–36.0)
Platelets: 75 10*3/uL — ABNORMAL LOW (ref 150–400)
RBC: 3.45 MIL/uL — ABNORMAL LOW (ref 4.22–5.81)
RDW: 15.7 % — ABNORMAL HIGH (ref 11.5–15.5)

## 2011-05-15 LAB — EXPECTORATED SPUTUM ASSESSMENT W GRAM STAIN, RFLX TO RESP C

## 2011-05-15 LAB — LIPID PANEL
LDL Cholesterol: 43 mg/dL (ref 0–99)
Total CHOL/HDL Ratio: 2 RATIO
VLDL: 11 mg/dL (ref 0–40)

## 2011-05-15 MED ORDER — SODIUM CHLORIDE 0.9 % IV BOLUS (SEPSIS)
1000.0000 mL | Freq: Once | INTRAVENOUS | Status: AC
  Administered 2011-05-15: 1000 mL via INTRAVENOUS

## 2011-05-15 MED ORDER — PIPERACILLIN-TAZOBACTAM 3.375 G IVPB
3.3750 g | Freq: Three times a day (TID) | INTRAVENOUS | Status: DC
  Administered 2011-05-15 – 2011-05-22 (×22): 3.375 g via INTRAVENOUS
  Filled 2011-05-15 (×25): qty 50

## 2011-05-15 MED ORDER — METOPROLOL TARTRATE 12.5 MG HALF TABLET
12.5000 mg | ORAL_TABLET | Freq: Two times a day (BID) | ORAL | Status: DC
  Administered 2011-05-15 – 2011-05-23 (×17): 12.5 mg via ORAL
  Filled 2011-05-15 (×18): qty 1

## 2011-05-15 MED ORDER — VANCOMYCIN HCL IN DEXTROSE 1-5 GM/200ML-% IV SOLN
1000.0000 mg | Freq: Two times a day (BID) | INTRAVENOUS | Status: DC
  Administered 2011-05-15: 1000 mg via INTRAVENOUS
  Filled 2011-05-15 (×2): qty 200

## 2011-05-15 MED ORDER — ALPRAZOLAM 0.25 MG PO TABS
0.2500 mg | ORAL_TABLET | Freq: Every day | ORAL | Status: DC | PRN
  Administered 2011-05-15 – 2011-05-20 (×5): 0.25 mg via ORAL
  Filled 2011-05-15 (×6): qty 1

## 2011-05-15 MED ORDER — VANCOMYCIN HCL IN DEXTROSE 1-5 GM/200ML-% IV SOLN
1000.0000 mg | INTRAVENOUS | Status: DC
  Administered 2011-05-16 – 2011-05-18 (×3): 1000 mg via INTRAVENOUS
  Filled 2011-05-15 (×4): qty 200

## 2011-05-15 MED ORDER — HYDROCORTISONE SOD SUCCINATE 100 MG IJ SOLR
50.0000 mg | Freq: Three times a day (TID) | INTRAMUSCULAR | Status: DC
  Administered 2011-05-15 – 2011-05-18 (×10): 50 mg via INTRAVENOUS
  Filled 2011-05-15 (×15): qty 1

## 2011-05-15 NOTE — Progress Notes (Signed)
Speech Language Pathology Clinical/Bedside Swallow Evaluation Patient Details  Name: [name] MRN: [id number] DOB: 05-03-1912 Today's Date: 05/15/2011  Past Medical History:  Past Medical History  Diagnosis Date  . Atrial fibrillation     Not on Coumadin due to recurrent, severe GIB  . H/O: GI bleed   . Inguinal hernia   . Spinal stenosis     S/p surgery   . MI, old 28  . Diverticulitis   . BPH (benign prostatic hyperplasia)    Past Surgical History:  Past Surgical History  Procedure Date  . Spine surgery     Spinal stenosis surgery with Dr. Darrelyn Hillock   . Cholecystectomy   . Total hip arthroplasty     Right    HPI:  SLP consulted to evaluate pt swallow function and safety.   Assessment/Recommendations/Treatment Plan    SLP Assessment Clinical Impression Statement: No overt s/s aspiration observed. RN reports difficulty with meds this morning. Recommend crushing meds in puree if able to do so. ST to follow for diet/med tolerance given report of difficulty with meds. Risk for Aspiration: Mild Other Related Risk Factors: Cognitive impairment  Recommendations Solid Consistency: Regular Liquid Consistency: Thin Liquid Administration via: Cup;Straw Medication Administration: Crushed with puree Supervision: Patient able to self feed;Intermittent supervision to cue for compensatory strategies Compensations: Slow rate;Small sips/bites Postural Changes and/or Swallow Maneuvers: Seated upright 90 degrees;Upright 30-60 min after meal Oral Care Recommendations: Oral care before and after PO Follow up Recommendations: None  Treatment Plan Treatment Plan Recommendations: Therapy as outlined in treatment plan below Speech Therapy Frequency: min 1 x/week Treatment Duration: 1 week Interventions: Aspiration precaution training;Patient/family education;Compensatory techniques;Diet toleration management by SLP  Prognosis Prognosis for Safe Diet Advancement:  Good Barriers to Reach Goals: Cognitive deficits  Individuals Consulted Consulted and Agree with Results and Recommendations: Patient;Family member/caregiver Family Member Consulted: daughter  Swallowing Goals  SLP Swallowing Goals Patient will consume recommended diet without observed clinical signs of aspiration with: Modified independent assistance Swallow Study Goal #1 - Progress: Progressing toward goal Patient will utilize recommended strategies during swallow to increase swallowing safety with: Modified independent assistance Swallow Study Goal #2 - Progress: Progressing toward goal  Swallow Study Prior Functional Status     General  Date of Onset: 05/13/11 HPI: SLP consulted to evaluate pt swallow function and safety. Type of Study: Bedside swallow evaluation Diet Prior to this Study: Regular;Thin liquids Temperature Spikes Noted: No Respiratory Status: Supplemental O2 delivered via (comment) (Dedham@5L ) History of Intubation: No Behavior/Cognition: Confused Oral Cavity - Dentition: Adequate natural dentition Vision: Functional for self-feeding Patient Positioning: Upright in bed Baseline Vocal Quality: Normal Volitional Cough: Weak Volitional Swallow: Able to elicit Ice chips: Not tested  Oral Motor/Sensory Function  Overall Oral Motor/Sensory Function: Appears within functional limits for tasks assessed Labial ROM: Within Functional Limits Labial Symmetry: Within Functional Limits Labial Strength: Within Functional Limits Labial Sensation: Within Functional Limits Lingual ROM: Within Functional Limits Lingual Symmetry: Within Functional Limits Lingual Strength: Within Functional Limits Lingual Sensation: Within Functional Limits Facial ROM: Within Functional Limits Facial Symmetry: Within Functional Limits Facial Strength: Within Functional Limits Facial Sensation: Within Functional Limits Velum: Within Functional Limits Mandible: Within Functional  Limits  Consistency Results  Ice Chips Ice chips: Not tested  Thin Liquid Thin Liquid: Within functional limits Presentation: Straw;Self Fed  Nectar Thick Liquid Nectar Thick Liquid: Not tested  Honey Thick Liquid Honey Thick Liquid: Not tested  Puree Puree: Within functional limits Presentation: Self Fed;Spoon  Solid Solid: Within  functional limits Presentation: Self Fed;Spoon  Celia B. Murvin Natal [facility], CCC-SLP [phone] Pager [phone]

## 2011-05-15 NOTE — Progress Notes (Signed)
  Echocardiogram 2D Echocardiogram has been performed.  [name] Nira Retort 05/15/2011, 10:08 AM

## 2011-05-15 NOTE — Progress Notes (Signed)
UR CHART REVIEWED; B Mazikeen Hehn RN, BSN, MHA 

## 2011-05-15 NOTE — Progress Notes (Signed)
Subjective:  The patient is more alert this am, knows why he is in the hospital. Rhythm atrial fib with VR 110.  No ischemic chest pain.  Objective:  Vital Signs in the last 24 hours: Temp:  [97.4 F (36.3 C)-99.3 F (37.4 C)] 97.4 F (36.3 C) (12/26 0800) Pulse Rate:  [45-107] 73  (12/26 0400) Resp:  [16-24] 20  (12/26 0400) BP: (68-118)/(38-80) 113/52 mmHg (12/26 0400) SpO2:  [91 %-98 %] 91 % (12/26 0400) Weight:  [159 lb 6.3 oz (72.3 kg)] 159 lb 6.3 oz (72.3 kg) (12/25 2000)  Intake/Output from previous day: 12/25 0701 - 12/26 0700 In: 3140 [P.O.:240; I.V.:2870] Out: 406 [Urine:405; Stool:1] Intake/Output from this shift:       . aspirin  81 mg Oral 3 times weekly  . docusate sodium  100 mg Oral BID  . guaiFENesin  600 mg Oral BID  . hydrocortisone sod succinate (SOLU-CORTEF) injection  50 mg Intravenous Q8H  . oseltamivir  75 mg Oral Daily  . piperacillin-tazobactam (ZOSYN)  IV  3.375 g Intravenous Q8H  . senna  1 tablet Oral BID  . sodium chloride  1,000 mL Intravenous STAT  . sodium chloride  1,000 mL Intravenous Once  . sodium chloride  1,000 mL Intravenous Once  . sodium chloride  500 mL Intravenous Once  . vancomycin  1,000 mg Intravenous Q12H  . vitamin A & D      . DISCONTD: heparin  5,000 Units Subcutaneous Q8H  . DISCONTD: moxifloxacin  400 mg Intravenous Q24H  . DISCONTD: piperacillin-tazobactam (ZOSYN)  IV  2.25 g Intravenous Q8H  . DISCONTD: sodium chloride  500 mL Intravenous Once  . DISCONTD: vancomycin  1,000 mg Intravenous Q12H      . sodium chloride 125 mL/hr at 05/15/11 0756    Physical Exam: The patient appears to be in no distress.  Head and neck exam reveals that the pupils are equal and reactive.  The extraocular movements are full.  There is no scleral icterus.  Mouth and pharynx are benign.  No lymphadenopathy.  No carotid bruits.  The jugular venous pressure is normal.  Thyroid is not enlarged or tender.  Chest:  Bilateral  rhonchi  Heart reveals no abnormal lift or heave.  First and second heart sounds are normal.  There is no murmur gallop rub or click. Rhythm is irregular.  The abdomen is soft and nontender.  Bowel sounds are normoactive.  There is no hepatosplenomegaly or mass.  There are no abdominal bruits.  Extremities reveal no phlebitis or edema.  Pedal pulses are good.  There is no cyanosis or clubbing.  Neurologic exam is normal strength and no lateralizing weakness.  No sensory deficits. Mentation improved.  Integument reveals no rash  Lab Results:  Basename 05/15/11 0056 05/14/11 1620  WBC 17.4* 13.9*  HGB 11.8* 11.9*  PLT 75* 71*    Basename 05/15/11 0056 05/14/11 1620  NA 131* 133*  K 3.5 4.3  CL 103 104  CO2 18* 22  GLUCOSE 75 106*  BUN 30* 29*  CREATININE 1.16 1.23    Basename 05/15/11 0056 05/14/11 1620  TROPONINI 0.74* 0.45*   Hepatic Function Panel  Basename 05/14/11 1620  PROT 5.9*  ALBUMIN 2.6*  AST 92*  ALT 63*  ALKPHOS 55  BILITOT 2.0*  BILIDIR --  IBILI --   No results found for this basename: CHOL in the last 72 hours No results found for this basename: PROTIME in the last 72 hours  Imaging: No new studies Cardiac Studies: 2D echo pending.  EKG pending. Assessment/Plan:  Patient Active Hospital Problem List: Encephalopathy (05/15/2011)   Assessment: Improving   Plan: Continue current therapy of pneumonia Atrial fibrillation (10/24/2010)   Assessment: Rate mildly up   Plan: Low dose metoprol if BP allows Benign hypertensive heart disease without heart failure (10/24/2010)   Assessment: BP borderline low at present   Plan: Hold BP meds Chronic ischemic heart disease (10/24/2010)   Assessment: NonSTEMI secondary to demand ischemia from dehydration/hypotension/pneumonia   Plan: ASA, BB, Conservative Rx.  2D echo Community acquired pneumonia (05/14/2011)   Assessment: Per primary   Plan: continue Tamiflu and antibiotics per primary NSTEMI (non-ST  elevated myocardial infarction) (05/14/2011)   Assessment: Conservative Rx   Plan: ASA. BB Influenza A (05/15/2011)   Assessment: Improving   Plan: Tamiflu    LOS: 2 days    Cassell Clement 05/15/2011, 8:21 AM

## 2011-05-15 NOTE — Progress Notes (Signed)
ANTIBIOTIC CONSULT NOTE - INITIAL  Pharmacy Consult for Vanc/Zosyn Indication: Pneumonia  No Known Allergies  Patient Measurements: Height: 5\' 7"  (170.2 cm) Weight: 159 lb 6.3 oz (72.3 kg) IBW/kg (Calculated) : 66.1   Vital Signs: Temp: 97.4 F (36.3 C) (12/26 0800) Temp src: Oral (12/26 0800) BP: 113/52 mmHg (12/26 0400) Pulse Rate: 73  (12/26 0400) Intake/Output from previous day: 12/25 0701 - 12/26 0700 In: 3140 [P.O.:240; I.V.:2870] Out: 406 [Urine:405; Stool:1] Intake/Output from this shift:    Labs:  Basename 05/15/11 0056 05/14/11 1620 05/14/11 0453  WBC 17.4* 13.9* 6.7  HGB 11.8* 11.9* 11.7*  PLT 75* 71* 84*  LABCREA -- -- --  CREATININE 1.16 1.23 1.19   Estimated Creatinine Clearance: 34 ml/min (by C-G formula based on Cr of 1.16). No results found for this basename: VANCOTROUGH:2,VANCOPEAK:2,VANCORANDOM:2,GENTTROUGH:2,GENTPEAK:2,GENTRANDOM:2,TOBRATROUGH:2,TOBRAPEAK:2,TOBRARND:2,AMIKACINPEAK:2,AMIKACINTROU:2,AMIKACIN:2, in the last 72 hours   Microbiology: Recent Results (from the past 720 hour(s))  CULTURE, BLOOD (ROUTINE X 2)     Status: Normal (Preliminary result)   Collection Time   05/14/11  8:34 AM      Component Value Range Status Comment   Specimen Description     Final    Value: BLOOD BOTTLES DRAWN AEROBIC AND ANAEROBIC LEFT HAND   Special Requests NONE [facility] EACH   Final    Setup Time [id number]   Final    Culture     Final    Value:        BLOOD CULTURE RECEIVED NO GROWTH TO DATE CULTURE WILL BE HELD FOR 5 DAYS BEFORE ISSUING A FINAL NEGATIVE REPORT   Report Status PENDING   Incomplete   CULTURE, BLOOD (ROUTINE X 2)     Status: Normal (Preliminary result)   Collection Time   05/14/11  9:13 AM      Component Value Range Status Comment   Specimen Description     Final    Value: BLOOD BOTTLES DRAWN AEROBIC AND ANAEROBIC RIGHT ARM   Special Requests NONE [facility] EACH   Final    Setup Time [id number]   Final    Culture     Final    Value:         BLOOD CULTURE RECEIVED NO GROWTH TO DATE CULTURE WILL BE HELD FOR 5 DAYS BEFORE ISSUING A FINAL NEGATIVE REPORT   Report Status PENDING   Incomplete   MRSA PCR SCREENING     Status: Normal   Collection Time   05/14/11  4:20 PM      Component Value Range Status Comment   MRSA by PCR NEGATIVE  NEGATIVE  Final   CULTURE, SPUTUM-ASSESSMENT     Status: Normal   Collection Time   05/15/11  6:11 AM      Component Value Range Status Comment   Specimen Description SPUTUM   Final    Special Requests NONE   Final    Sputum evaluation     Final    Value: THIS SPECIMEN IS ACCEPTABLE. RESPIRATORY CULTURE REPORT TO FOLLOW.   Report Status 05/15/2011 FINAL   Final     Medical History: Past Medical History  Diagnosis Date  . Atrial fibrillation     Not on Coumadin due to recurrent, severe GIB  . H/O: GI bleed   . Inguinal hernia   . Spinal stenosis     S/p surgery   . MI, old 66  . Diverticulitis   . BPH (benign prostatic hyperplasia)     Medications:  Scheduled:    . aspirin  81 mg Oral 3 times weekly  . docusate sodium  100 mg Oral BID  . guaiFENesin  600 mg Oral BID  . hydrocortisone sod succinate (SOLU-CORTEF) injection  50 mg Intravenous Q8H  . metoprolol tartrate  12.5 mg Oral BID  . oseltamivir  75 mg Oral Daily  . piperacillin-tazobactam (ZOSYN)  IV  3.375 g Intravenous Q8H  . senna  1 tablet Oral BID  . sodium chloride  1,000 mL Intravenous STAT  . sodium chloride  1,000 mL Intravenous Once  . sodium chloride  1,000 mL Intravenous Once  . sodium chloride  500 mL Intravenous Once  . vancomycin  1,000 mg Intravenous Q24H  . vitamin A & D      . DISCONTD: heparin  5,000 Units Subcutaneous Q8H  . DISCONTD: moxifloxacin  400 mg Intravenous Q24H  . DISCONTD: piperacillin-tazobactam (ZOSYN)  IV  2.25 g Intravenous Q8H  . DISCONTD: sodium chloride  500 mL Intravenous Once  . DISCONTD: vancomycin  1,000 mg Intravenous Q12H  . DISCONTD: vancomycin  1,000 mg Intravenous Q12H    Infusions:    . sodium chloride 125 mL/hr at 05/15/11 0756   Assessment:  89 YOM admitted with SIRS to start Vanc/Zosyn  Vanc/Zosyn protocol missed 12/25   D2 Tamiflu for + Influenza A.   MD ordered Vanc 1g q12 and Zosyn EI for now but would like Rx to continue dosing.   Stress dose steroids started 12/26.    Afebrile, WBC up since no abx, normal CrCl 36 ml/min, PCT 17.2 on 12/25.   12/25 Blcx pending Neg MRSA screen 12/26 Resp cx pending  Goal of Therapy:  Vancomycin trough level 15-20 mcg/ml  Plan:   Change vanc to 1gm q24, continue zosyn.  F/u renal fxn, cultures and adjust abx as needed.  Geoffry Paradise Thi 05/15/2011,8:46 AM

## 2011-05-15 NOTE — Progress Notes (Signed)
Patient ID: [name], male   DOB: 06-08-1911, 77 y.o.   MRN: [id number]  Subjective: No events overnight. Patient denies chest pain, shortness of breath, abdominal pain.   Objective:  Vital signs in last 24 hours:  Filed Vitals:   05/14/11 1900 05/14/11 2000 05/15/11 0000 05/15/11 0400  BP: 112/62 118/51 108/52 113/52  Pulse: 85 76 94 73  Temp:  97.8 F (36.6 C) 99.3 F (37.4 C) 98.1 F (36.7 C)  TempSrc:  Oral Axillary Axillary  Resp: 24 22 24 20   Height:      Weight:  72.3 kg (159 lb 6.3 oz)    SpO2: 96% 92% 92% 91%    Intake/Output from previous day:  Intake/Output Summary (Last 24 hours) at 05/15/11 2130 Last data filed at 05/15/11 0700  Gross per 24 hour  Intake   3140 ml  Output    406 ml  Net   2734 ml    Physical Exam: General: Alert, awake, oriented to name only, in no acute distress.Follows commands HEENT: No bruits, no goiter. Moist mucous membranes, no scleral icterus, no conjunctival pallor. Heart: Irregular rate and rhythm, no murmurs, rubs, gallops. Lungs: Scattered rhonchi bilaterally. No wheezing, no rales. Decreased air movement at bases Abdomen: Soft, nontender, nondistended, positive bowel sounds. Extremities: No clubbing or cyanosis, no pitting edema,  positive pedal pulses. Neuro: Grossly nonfocal.  Lab Results:  Basic Metabolic Panel:    Component Value Date/Time   NA 131* 05/15/2011 0056   K 3.5 05/15/2011 0056   CL 103 05/15/2011 0056   CO2 18* 05/15/2011 0056   BUN 30* 05/15/2011 0056   CREATININE 1.16 05/15/2011 0056   GLUCOSE 75 05/15/2011 0056   CALCIUM 7.5* 05/15/2011 0056   CBC:    Component Value Date/Time   WBC 17.4* 05/15/2011 0056   HGB 11.8* 05/15/2011 0056   HCT 33.3* 05/15/2011 0056   PLT 75* 05/15/2011 0056   MCV 96.5 05/15/2011 0056   NEUTROABS 11.2* 05/13/2011 2315   LYMPHSABS 0.5* 05/13/2011 2315   MONOABS 0.6 05/13/2011 2315   EOSABS 0.0 05/13/2011 2315   BASOSABS 0.0 05/13/2011 2315       Lab 05/15/11 0056 05/14/11 1620 05/14/11 0453 05/13/11 2315  WBC 17.4* 13.9* 6.7 12.3*  HGB 11.8* 11.9* 11.7* 14.0  HCT 33.3* 33.7* 33.4* 40.1  PLT 75* 71* 84* 111*  MCV 96.5 97.4 96.8 96.4  MCH 34.2* 34.4* 33.9 33.7  MCHC 35.4 35.3 35.0 34.9  RDW 15.7* 15.6* 15.3 15.4  LYMPHSABS -- -- -- 0.5*  MONOABS -- -- -- 0.6  EOSABS -- -- -- 0.0  BASOSABS -- -- -- 0.0  BANDABS -- -- -- --    Lab 05/15/11 0056 05/14/11 1620 05/14/11 0453 05/13/11 2315  NA 131* 133* 135 136  K 3.5 4.3 3.8 4.3  CL 103 104 103 98  CO2 18* 22 23 20   GLUCOSE 75 106* 88 79  BUN 30* 29* 24* 22  CREATININE 1.16 1.23 1.19 0.97  CALCIUM 7.5* 7.6* 8.1* 9.5  MG -- -- -- --   No results found for this basename: INR:5,PROTIME:5 in the last 168 hours Cardiac markers:  Lab 05/15/11 0056 05/14/11 1620 05/14/11 0914  CKMB 15.9* 10.2* 5.4*  TROPONINI 0.74* 0.45* <0.30  MYOGLOBIN -- -- --   No components found with this basename: POCBNP:3 Recent Results (from the past 240 hour(s))  MRSA PCR SCREENING     Status: Normal   Collection Time   05/14/11  4:20 PM  Component Value Range Status Comment   MRSA by PCR NEGATIVE  NEGATIVE  Final   CULTURE, SPUTUM-ASSESSMENT     Status: Normal   Collection Time   05/15/11  6:11 AM      Component Value Range Status Comment   Specimen Description SPUTUM   Final    Special Requests NONE   Final    Sputum evaluation     Final    Value: THIS SPECIMEN IS ACCEPTABLE. RESPIRATORY CULTURE REPORT TO FOLLOW.   Report Status 05/15/2011 FINAL   Final     Studies/Results:  Dg Chest 2 View 05/14/2011    IMPRESSION: Multi focal left greater than right air space disease with dense consolidation of the left lower lobe, most compatible with multifocal pneumonia.  Asymmetric/atypical pulmonary edema and aspiration considered less likely.   Ct Head Wo Contrast 05/14/2011 IMPRESSION: Stable atrophy and microvascular ischemic changes.  No acute process by noncontrast CT.      Medications: Scheduled Meds:   . aspirin  81 mg Oral 3 times weekly  . docusate sodium  100 mg Oral BID  . guaiFENesin  600 mg Oral BID  . oseltamivir  75 mg Oral Daily  . piperacillin-tazobactam (ZOSYN)  IV  3.375 g Intravenous Q8H  . senna  1 tablet Oral BID  . sodium chloride  1,000 mL Intravenous STAT  . sodium chloride  1,000 mL Intravenous Once  . sodium chloride  500 mL Intravenous Once  . vancomycin  1,000 mg Intravenous Q12H  . vitamin A & D      . DISCONTD: heparin  5,000 Units Subcutaneous Q8H  . DISCONTD: moxifloxacin  400 mg Intravenous Q24H  . DISCONTD: piperacillin-tazobactam (ZOSYN)  IV  2.25 g Intravenous Q8H  . DISCONTD: sodium chloride  500 mL Intravenous Once  . DISCONTD: vancomycin  1,000 mg Intravenous Q12H   Continuous Infusions:   . sodium chloride 125 mL/hr at 05/14/11 2325   PRN Meds:.acetaminophen, acetaminophen, albuterol, ondansetron (ZOFRAN) IV, ondansetron  Assessment/Plan:  Principal Problem:  *Encephalopathy - this is most likely multifactorial, secondary to progressive dementia, deconditioning with acute illness, sepsis (from PNA) - pt is clinically improving and we will continue to monitor him in SDU for now  - continue supportive care with IVF and give additional bolus this AM  Active Problems:  Sepsis associated with hypotension - most likely secondary to PNA and please note that pt has also tested positive for Influenza - Tmax overnight was 99.3 F and WBC continues to trend up - will continue broad spectrum antibiotics Vancomycin and Zosyn along with Tamiflu - continue IVF and provide additional bolus this AM - follow up on labs: lactic acid, blood cultures - start stress dose steroids   Atrial fibrillation - currently in a-fib - per cardiology recommendations will continue only aspirin at this time, no other anticoagulation recommended given history of GI bleed   Community acquired pneumonia - multifocal - continue Vanc and  Zosyn (started 05/14/2011)   NSTEMI (non-ST elevated myocardial infarction - per cardiology recommendations will continue ASA only - Plavix or Heparin recommended  - continue to cycle CE's and follow up on 2 D ECHO   Influenza A - continue Tamiflu   Thrombocytopenia - plts are stable since yesterday - pt has chronic thrombocytopenia with baseline plts ~ 100   EDUCATION - test results and diagnostic studies were discussed with patient and pt's family at bedside - family have verbalized the understanding - questions were answered at the bedside  and contact information was provided for additional questions or concerns   LOS: 2 days   MAGICK-Aadvika Konen 05/15/2011, 7:52 AM  [facility] Pager: [phone]

## 2011-05-15 NOTE — Progress Notes (Signed)
PT cancelled today due to medical issues.will follow up 12/27.

## 2011-05-16 ENCOUNTER — Other Ambulatory Visit: Payer: Self-pay

## 2011-05-16 DIAGNOSIS — E876 Hypokalemia: Secondary | ICD-10-CM

## 2011-05-16 LAB — CBC
HCT: 32.5 % — ABNORMAL LOW (ref 39.0–52.0)
HCT: 35 % — ABNORMAL LOW (ref 39.0–52.0)
Hemoglobin: 11.4 g/dL — ABNORMAL LOW (ref 13.0–17.0)
Hemoglobin: 12.2 g/dL — ABNORMAL LOW (ref 13.0–17.0)
MCH: 33.7 pg (ref 26.0–34.0)
MCH: 33.5 pg (ref 26.0–34.0)
MCHC: 35.1 g/dL (ref 30.0–36.0)
MCHC: 34.9 g/dL (ref 30.0–36.0)
RDW: 15.6 % — ABNORMAL HIGH (ref 11.5–15.5)
RDW: 15.6 % — ABNORMAL HIGH (ref 11.5–15.5)

## 2011-05-16 LAB — BASIC METABOLIC PANEL
BUN: 32 mg/dL — ABNORMAL HIGH (ref 6–23)
Calcium: 8.2 mg/dL — ABNORMAL LOW (ref 8.4–10.5)
GFR calc Af Amer: 64 mL/min — ABNORMAL LOW (ref >90)
GFR calc non Af Amer: 55 mL/min — ABNORMAL LOW (ref >90)
Glucose, Bld: 127 mg/dL — ABNORMAL HIGH (ref 70–99)
Potassium: 3.3 mEq/L — ABNORMAL LOW (ref 3.5–5.1)
Sodium: 134 mEq/L — ABNORMAL LOW (ref 135–145)

## 2011-05-16 LAB — DIFFERENTIAL
Basophils Absolute: 0 10*3/uL (ref 0.0–0.1)
Basophils Relative: 0 % (ref 0–1)
Eosinophils Absolute: 0 10*3/uL (ref 0.0–0.7)
Eosinophils Relative: 0 % (ref 0–5)
Monocytes Absolute: 0.9 10*3/uL (ref 0.1–1.0)
Monocytes Relative: 4 % (ref 3–12)

## 2011-05-16 MED ORDER — DOXEPIN HCL 25 MG PO CAPS
25.0000 mg | ORAL_CAPSULE | Freq: Every day | ORAL | Status: DC
  Administered 2011-05-16 – 2011-05-22 (×7): 25 mg via ORAL
  Filled 2011-05-16 (×9): qty 1

## 2011-05-16 MED ORDER — POTASSIUM CHLORIDE CRYS ER 20 MEQ PO TBCR
40.0000 meq | EXTENDED_RELEASE_TABLET | Freq: Once | ORAL | Status: AC
  Administered 2011-05-16: 40 meq via ORAL
  Filled 2011-05-16: qty 2

## 2011-05-16 MED ORDER — SODIUM CHLORIDE 0.9 % IV SOLN
INTRAVENOUS | Status: DC
Start: 2011-05-16 — End: 2011-05-19
  Administered 2011-05-17 – 2011-05-19 (×3): via INTRAVENOUS
  Administered 2011-05-19: 100 mL/h via INTRAVENOUS

## 2011-05-16 NOTE — Progress Notes (Signed)
Physical Therapy Evaluation Patient Details Name: [name] MRN: [id number] DOB: Apr 28, 1912 Today's Date: 05/16/2011 [id number] List:  Patient Active Problem List  Diagnoses  . Atrial fibrillation  . Benign hypertensive heart disease without heart failure  . Chronic ischemic heart disease  . Inguinal hernia  . Community acquired pneumonia  . NSTEMI (non-ST elevated myocardial infarction)  . Influenza A  . Encephalopathy  . Thrombocytopenia  . Hypokalemia    Past Medical History:  Past Medical History  Diagnosis Date  . Atrial fibrillation     Not on Coumadin due to recurrent, severe GIB  . H/O: GI bleed   . Inguinal hernia   . Spinal stenosis     S/p surgery   . MI, old 16  . Diverticulitis   . BPH (benign prostatic hyperplasia)    Past Surgical History:  Past Surgical History  Procedure Date  . Spine surgery     Spinal stenosis surgery with Dr. Darrelyn Hillock   . Cholecystectomy   . Total hip arthroplasty     Right     PT Assessment/Plan/Recommendation PT Assessment Clinical Impression Statement: pt admitted with septic shock, has tolerated mobility PT eval well. pt is confused at times, daughter  is hopeful for SNF at [facility], but is concerned that pt will not consent. at present pt is having hallucinations. pt can benefit from PT  to improve functional mobility and strength to DC to next venue. PT Recommendation/Assessment: Patient will need skilled PT in the acute care venue PT Problem List: Decreased strength;Decreased activity tolerance;Decreased mobility;Decreased knowledge of use of DME;Decreased cognition;Cardiopulmonary status limiting activity Barriers to Discharge: Decreased caregiver support PT Therapy Diagnosis : Difficulty walking;Generalized weakness PT Plan PT Frequency: Min 3X/week PT Treatment/Interventions: DME instruction;Gait training;Functional mobility training;Patient/family education PT  Recommendation Recommendations for Other Services: OT consult Follow Up Recommendations: Skilled nursing facility Equipment Recommended: Defer to next venue PT Goals  Acute Rehab PT Goals PT Goal Formulation: With patient/family Time For Goal Achievement: 2 weeks Pt will go Supine/Side to Sit: with supervision PT Goal: Supine/Side to Sit - Progress: Not met Pt will go Sit to Supine/Side: with supervision PT Goal: Sit to Supine/Side - Progress: Not met Pt will go Stand to Sit: with supervision PT Goal: Stand to Sit - Progress: Not met Pt will Transfer Bed to Chair/Chair to Bed: with min assist PT Transfer Goal: Bed to Chair/Chair to Bed - Progress: Not met Pt will Ambulate: 51 - 150 feet;with min assist PT Goal: Ambulate - Progress: Not met  PT Evaluation Precautions/Restrictions  Precautions Precautions: Fall Prior Functioning  Home Living Lives With: Alone Receives Help From: Family Type of Home: Apartment Home Layout: One level Home Access: Level entry Bathroom Shower/Tub: Health visitor: Handicapped height Bathroom Accessibility: Yes How Accessible: Accessible via walker Home Adaptive Equipment: Grab bars in shower;Walker - rolling;Walker - four wheeled Prior Function Level of Independence: Independent with basic ADLs;Independent with homemaking with ambulation;Independent with gait Driving: Yes Vocation: Part time employment 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 Comments: per daughter pt was very independent except that he has had some balance difficulties Cognition Cognition Arousal/Alertness: Awake/alert Overall Cognitive Status: Impaired Orientation Level: Oriented to person;Oriented to place Sensation/Coordination Sensation Light Touch: Appears Intact Coordination Gross Motor Movements are Fluid and Coordinated: Yes Extremity Assessment RUE Assessment RUE Assessment: Within Functional Limits LUE  Assessment LUE Assessment: Within Functional Limits RLE Assessment RLE Assessment: Within Functional Limits LLE Assessment LLE Assessment: Within Functional Limits  Mobility (including Balance) Bed Mobility Bed Mobility: Yes Supine to Sit: 1: +2 Total assist;HOB elevated (Comment degrees);With rails Supine to Sit Details (indicate cue type and reason): pt required tc to mobilize, stay on task, HOB at 50% Transfers Transfers: Yes Sit to Stand: 1: +2 Total assist;From elevated surface;From bed Sit to Stand Details (indicate cue type and reason): tc to place hands onto RW, vc for activity Stand to Sit: 1: +2 Total assist;To chair/3-in-1;With armrests Stand Pivot Transfers: 1: +2 Total assist Stand Pivot Transfer Details (indicate cue type and reason): pt utilzed  Rw  to take steps to chair Ambulation/Gait Ambulation/Gait: No  Posture/Postural Control Posture/Postural Control: No significant limitations Exercise    End of Session PT - End of Session Equipment Utilized During Treatment: Gait belt Activity Tolerance: Patient tolerated treatment well Patient left: in chair;with call bell in reach;with family/visitor present General Behavior During Session: [facility] for tasks performed Cognition: Impaired (pt hallucinating about many pills in his lap)  Rada Hay 05/16/2011, 2:28 PM

## 2011-05-16 NOTE — Progress Notes (Signed)
Subjective:  Less dyspneic.  Rhythm on tele is more regular this am--NSR vs flutter with block--will get 12 lead EKG. More confused this am and was hallucinating last night.  Family feels that he might benefit from resuming the chronic doxepin at [facility] which he has been on for years.  Will order.  Objective:  Vital Signs in the last 24 hours: Temp:  [97.6 F (36.4 C)-97.8 F (36.6 C)] 97.6 F (36.4 C) (12/27 0400) Pulse Rate:  [70-97] 91  (12/26 2300) Resp:  [21-27] 24  (12/26 2300) BP: (94-152)/(52-76) 140/75 mmHg (12/27 0400) SpO2:  [90 %-100 %] 100 % (12/27 0056)  Intake/Output from previous day: 12/26 0701 - 12/27 0700 In: 2147.5 [P.O.:420; I.V.:615; IV Piggyback:1112.5] Out: 620 [Urine:620] Intake/Output from this shift:       . aspirin  81 mg Oral 3 times weekly  . docusate sodium  100 mg Oral BID  . doxepin  25 mg Oral QHS  . guaiFENesin  600 mg Oral BID  . hydrocortisone sod succinate (SOLU-CORTEF) injection  50 mg Intravenous Q8H  . metoprolol tartrate  12.5 mg Oral BID  . oseltamivir  75 mg Oral Daily  . piperacillin-tazobactam (ZOSYN)  IV  3.375 g Intravenous Q8H  . senna  1 tablet Oral BID  . vancomycin  1,000 mg Intravenous Q24H      . DISCONTD: sodium chloride 125 mL/hr at 05/15/11 0756    Physical Exam: The patient appears to be in no distress.  Head and neck exam reveals that the pupils are equal and reactive.  The extraocular movements are full.  There is no scleral icterus.  Mouth and pharynx are benign.  No lymphadenopathy.  No carotid bruits.  The jugular venous pressure is normal.  Thyroid is not enlarged or tender.  Chest:  Bilateral rhonchi  Heart reveals no abnormal lift or heave.  First and second heart sounds are normal.  There is no murmur gallop rub or click. Rhythm is irregular.  The abdomen is soft and nontender.  Bowel sounds are normoactive.  There is no hepatosplenomegaly or mass.  There are no abdominal bruits.  Extremities reveal  no phlebitis or edema.  Pedal pulses are good.  There is no cyanosis or clubbing.  Neurologic exam is normal strength and no lateralizing weakness.  No sensory deficits. Mentation improved.  Integument reveals no rash  Lab Results:  Basename 05/16/11 0308 05/15/11 0056  WBC 18.0* 17.4*  HGB 11.4* 11.8*  PLT 74* 75*    Basename 05/16/11 0308 05/15/11 0820  NA 134* 135  K 3.3* 3.8  CL 105 106  CO2 19 18*  GLUCOSE 127* 74  BUN 32* 32*  CREATININE 1.08 1.04    Basename 05/15/11 0820 05/15/11 0056  TROPONINI 0.88* 0.74*   Hepatic Function Panel  Basename 05/15/11 0820  PROT 6.8  ALBUMIN 2.9*  AST 112*  ALT 77*  ALKPHOS 72  BILITOT 1.8*  BILIDIR --  IBILI --    Basename 05/15/11 0820  CHOL 106   No results found for this basename: PROTIME in the last 72 hours  Imaging: No new studies Cardiac Studies: 2D echo shows normal EF and mild pulmonary hypertension. Assessment/Plan:  Patient Active Hospital Problem List: Encephalopathy (05/15/2011)   Assessment: Halllucinations last pm   Plan: Continue current therapy of pneumonia.  Add doxepin 25 HS. Atrial fibrillation (10/24/2010)   Assessment: Appears to be back in NSR today.   Plan: Check EKG Benign hypertensive heart disease without heart  failure (10/24/2010)   Assessment: BP normal since correction of dehydration.   Plan: Continue current Rx Chronic ischemic heart disease (10/24/2010)   Assessment: NonSTEMI secondary to demand ischemia from dehydration/hypotension/pneumonia   Plan: ASA, BB, Conservative Rx. Community acquired pneumonia (05/14/2011)   Assessment: Per primary   Plan: continue Tamiflu and antibiotics per primary NSTEMI (non-ST elevated myocardial infarction) (05/14/2011)   Assessment: Conservative Rx   Plan: ASA. BB Influenza A (05/15/2011)   Assessment: Improving   Plan: Tamiflu    LOS: 3 days    Cassell Clement 05/16/2011, 8:34 AM

## 2011-05-16 NOTE — Progress Notes (Signed)
Attempted to ambulate per order, upon standing up at side of bed, patients HR up to 120's, o2 sats down to 60's, pt very unsteady and shaking, required 50% assistance just to stand, pt was immediately placed back in bed and hr returned to 90's, 02 sats increased to high 90's will cont. To monitor

## 2011-05-16 NOTE — Progress Notes (Signed)
Pt HR 39 x1 and 44 x1, pt is asymptomatic, HR in 50's, pt resting comfortably, MD notified, no new orders.

## 2011-05-16 NOTE — Progress Notes (Signed)
Speech Pathology: Dysphagia Treatment Note  Patient was observed with : Mechanical Soft / Ground and Thin liquids.  Patient was noted to have no s/s of aspiration : Intermittent cough regardless of po presentation. No reflexive cough immediately following po presentation, however.    Lung Sounds: scattered rhonchi bilaterally Temperature: afebrile  Patient required: Verbal cues to consistently follow precautions/strategies. Strategies posted at head of bed.  Clinical Impression: Pt having difficulty with solid consistencies.  Will change diet to soft with chopped meats for energy conservation and to facilitate safe chewing/swallowing.  Pt inconsistently tolerates straw use, largely due to variable confusion. Will recommend no straws.  Pt does not have significant high risk indicators to raise suspicion of aspiration (no hx stroke, COPD, dementia (although pt is now confused), however, risk in increased due to advanced age and current confusion.  Recommendations:  Modify diet for safety and energy conservation, monitor diet tolerance.  Pain:   none Intervention Required:   No  Goals: Goals Partially Met  Anastasios Melander B. Murvin [facility], CCC-SLP [phone] Pager [phone]

## 2011-05-16 NOTE — Progress Notes (Signed)
Subjective: Daughter reports that patient seems like he is doing better today.  Heart rhythm is NSR reportedly according to cards.  Pt feels better today.  + cough, denies any fever or chills, abd discomfort.  Objective: Filed Vitals:   05/16/11 0056 05/16/11 0400 05/16/11 0800 05/16/11 0900  BP:  140/75 144/65   Pulse:   80 82  Temp:  97.6 F (36.4 C) 97.6 F (36.4 C)   TempSrc:  Axillary Axillary   Resp:   21 19  Height:      Weight:      SpO2: 100%  100% 99%   Weight change:   Intake/Output Summary (Last 24 hours) at 05/16/11 1138 Last data filed at 05/16/11 0834  Gross per 24 hour  Intake  602.5 ml  Output    645 ml  Net  -42.5 ml    General: Alert, awake, oriented x3, in no acute distress.  HEENT: No bruits, no goiter.  Heart: Regular rate and rhythm, without murmurs, rubs, gallops.  Lungs:  Lungs: Scattered rhonchi bilaterally. No wheezing, no rales. Decreased air movement at bases Abdomen: Soft, nontender, nondistended, positive bowel sounds.  Neuro: Grossly intact, nonfocal.   Lab Results:  [facility] 05/16/11 0308 05/15/11 0820  NA 134* 135  K 3.3* 3.8  CL 105 106  CO2 19 18*  GLUCOSE 127* 74  BUN 32* 32*  CREATININE 1.08 1.04  CALCIUM 8.2* 7.9*  MG -- --  PHOS -- --    Basename 05/15/11 0820 05/14/11 1620  AST 112* 92*  ALT 77* 63*  ALKPHOS 72 55  BILITOT 1.8* 2.0*  PROT 6.8 5.9*  ALBUMIN 2.9* 2.6*   No results found for this basename: LIPASE:2,AMYLASE:2 in the last 72 hours  Basename 05/16/11 0308 05/15/11 0056 05/13/11 2315  WBC 18.0* 17.4* --  NEUTROABS -- -- 11.2*  HGB 11.4* 11.8* --  HCT 32.5* 33.3* --  MCV 96.2 96.5 --  PLT 74* 75* --    Basename 05/15/11 0820 05/15/11 0056 05/14/11 1620  CKTOTAL 534* 549* 382*  CKMB 20.4* 15.9* 10.2*  CKMBINDEX -- -- --  TROPONINI 0.88* 0.74* 0.45*   No components found with this basename: POCBNP:3 No results found for this basename: DDIMER:2 in the last 72 hours No results found for this  basename: HGBA1C:2 in the last 72 hours  Basename 05/15/11 0820  CHOL 106  HDL 52  LDLCALC 43  TRIG 55  CHOLHDL 2.0  LDLDIRECT --   No results found for this basename: TSH,T4TOTAL,FREET3,T3FREE,THYROIDAB in the last 72 hours No results found for this basename: VITAMINB12:2,FOLATE:2,FERRITIN:2,TIBC:2,IRON:2,RETICCTPCT:2 in the last 72 hours  Micro Results: Recent Results (from the past 240 hour(s))  CULTURE, BLOOD (SINGLE)     Status: Normal (Preliminary result)   Collection Time   05/14/11  4:53 AM      Component Value Range Status Comment   Specimen Description BLOOD RIGHT ARM   Final    Special Requests BOTTLES DRAWN AEROBIC AND ANAEROBIC 5CC   Final    Setup Time [id number]   Final    Culture     Final    Value:        BLOOD CULTURE RECEIVED NO GROWTH TO DATE CULTURE WILL BE HELD FOR 5 DAYS BEFORE ISSUING A FINAL NEGATIVE REPORT   Report Status PENDING   Incomplete   CULTURE, BLOOD (ROUTINE X 2)     Status: Normal (Preliminary result)   Collection Time   05/14/11  8:34 AM  Component Value Range Status Comment   Specimen Description     Final    Value: BLOOD BOTTLES DRAWN AEROBIC AND ANAEROBIC LEFT HAND   Special Requests NONE [facility] EACH   Final    Setup Time [id number]   Final    Culture     Final    Value:        BLOOD CULTURE RECEIVED NO GROWTH TO DATE CULTURE WILL BE HELD FOR 5 DAYS BEFORE ISSUING A FINAL NEGATIVE REPORT   Report Status PENDING   Incomplete   CULTURE, BLOOD (ROUTINE X 2)     Status: Normal (Preliminary result)   Collection Time   05/14/11  9:13 AM      Component Value Range Status Comment   Specimen Description     Final    Value: BLOOD BOTTLES DRAWN AEROBIC AND ANAEROBIC RIGHT ARM   Special Requests NONE [name] EACH   Final    Setup Time [id number]   Final    Culture     Final    Value:        BLOOD CULTURE RECEIVED NO GROWTH TO DATE CULTURE WILL BE HELD FOR 5 DAYS BEFORE ISSUING A FINAL NEGATIVE REPORT   Report Status PENDING   Incomplete     MRSA PCR SCREENING     Status: Normal   Collection Time   05/14/11  4:20 PM      Component Value Range Status Comment   MRSA by PCR NEGATIVE  NEGATIVE  Final   CULTURE, SPUTUM-ASSESSMENT     Status: Normal   Collection Time   05/15/11  6:11 AM      Component Value Range Status Comment   Specimen Description SPUTUM   Final    Special Requests NONE   Final    Sputum evaluation     Final    Value: THIS SPECIMEN IS ACCEPTABLE. RESPIRATORY CULTURE REPORT TO FOLLOW.   Report Status 05/15/2011 FINAL   Final   CULTURE, RESPIRATORY     Status: Normal (Preliminary result)   Collection Time   05/15/11  6:11 AM      Component Value Range Status Comment   Specimen Description SPUTUM   Final    Special Requests NONE   Final    Gram Stain     Final    Value: FEW WBC PRESENT, PREDOMINANTLY PMN     RARE SQUAMOUS EPITHELIAL CELLS PRESENT     RARE GRAM POSITIVE COCCI     IN PAIRS   Culture NORMAL OROPHARYNGEAL FLORA   Final    Report Status PENDING   Incomplete     Studies/Results: Dg Chest Port 1 View  05/15/2011  *RADIOLOGY REPORT*  Clinical Data: Shortness of breath, weakness  PORTABLE CHEST - 1 VIEW  Comparison: Chest x-ray of 05/14/2011  Findings: There has been significant worsening of opacities at both lung bases most consistent with pneumonia and probable effusions. Mild superimposed edema cannot be excluded.  The heart is mildly enlarged.  No bony abnormality is seen.  IMPRESSION: Probable bilateral lower lobe pneumonia with effusions.  Cannot exclude superimposed edema.  Original Report Authenticated By: Juline Patch, M.D.    Medications: I have reviewed the patient's current medications.   Patient Active Hospital Problem List: Encephalopathy (05/15/2011)  Resolving.  Likely related to PNA.  Will continue current drug regimen.  Atrial fibrillation (10/24/2010)  Cards is following and patient is currently on NSR.  Pt on metoprolol  Benign hypertensive heart disease without heart  failure (10/24/2010) Pt on metoprolol.  Was recently hypotensive secondary to presumed septic hypovolemia.  Last recorded BP 144/65.  Will continue to observe and monitor at this juncture.  Chronic ischemic heart disease (10/24/2010) Stable  Community acquired pneumonia (05/14/2011) Pt is on Vanc and Zosyn.  Will plan on repeating cbc today with differential.  May have to consider changing antibiotics should leukocytosis continue to trend up.  WBC slightly elevated today.  But patient has shown clinical improvement on this current abx regimen.  Therefor will plan on continuing this abx drug regimen and reassessing tomorrow.  NSTEMI (non-ST elevated myocardial infarction) (05/14/2011) Cards on board will f/u with their recs.  Pt is not currently on ACEI but patient was recently hypotensive.  Will consider adding should blood pressures remain elevated.  Influenza A (05/15/2011) Pt on tamiflu currently  Sepsis associated hypotension (05/15/2011) Resolved  Thrombocytopenia (05/15/2011) Stable will continue to monitor  Hypokalemia: will plan on replacing today.  Hyponatremia:  Will start normal saline today.  Disposition:  Pt is starting to show clinical improvement.  Based on his recent history of Hypotension I will plan on continuing to monitor the patient at ICU stepdown.  Will continue IV abxs.  Plan on transitioning tomorrow to po abx's and transfer out to floor pending clinical improvement.  Will order PT/OT to eval and treat.     LOS: 3 days   Penny Pia M.D.  [facility] 05/16/2011, 11:38 AM

## 2011-05-17 LAB — CBC
HCT: 32.6 % — ABNORMAL LOW (ref 39.0–52.0)
Hemoglobin: 11.8 g/dL — ABNORMAL LOW (ref 13.0–17.0)
MCH: 34.5 pg — ABNORMAL HIGH (ref 26.0–34.0)
MCHC: 36.2 g/dL — ABNORMAL HIGH (ref 30.0–36.0)
MCV: 95.3 fL (ref 78.0–100.0)

## 2011-05-17 LAB — BASIC METABOLIC PANEL
BUN: 30 mg/dL — ABNORMAL HIGH (ref 6–23)
Calcium: 8.5 mg/dL (ref 8.4–10.5)
Creatinine, Ser: 0.94 mg/dL (ref 0.50–1.35)
GFR calc non Af Amer: 68 mL/min — ABNORMAL LOW (ref >90)
Glucose, Bld: 132 mg/dL — ABNORMAL HIGH (ref 70–99)
Potassium: 3.4 mEq/L — ABNORMAL LOW (ref 3.5–5.1)

## 2011-05-17 LAB — CULTURE, RESPIRATORY W GRAM STAIN

## 2011-05-17 LAB — GLUCOSE, CAPILLARY

## 2011-05-17 MED ORDER — POTASSIUM CHLORIDE CRYS ER 20 MEQ PO TBCR
40.0000 meq | EXTENDED_RELEASE_TABLET | Freq: Once | ORAL | Status: AC
  Administered 2011-05-17: 40 meq via ORAL
  Filled 2011-05-17: qty 2

## 2011-05-17 NOTE — Progress Notes (Signed)
Subjective: Pt states that he feels better today.  With exertion he states that he gets short of breath. Otherwise no complaints.  Nursing reports that he has not had any hypotensive readings. No acute issues overnight  Objective: Filed Vitals:   05/16/11 2050 05/17/11 0000 05/17/11 0400 05/17/11 0800  BP:  141/68 124/68   Pulse:  61 80   Temp:  97.4 F (36.3 C) 97.7 F (36.5 C) 97.3 F (36.3 C)  TempSrc:  Oral Oral Oral  Resp:  18 21   Height:      Weight:      SpO2: 97% 97% 94%    Weight change:   Intake/Output Summary (Last 24 hours) at 05/17/11 0859 Last data filed at 05/17/11 0960  Gross per 24 hour  Intake 591.67 ml  Output    770 ml  Net -178.33 ml    General: Alert, awake, oriented x3, in no acute distress.  HEENT: No bruits, no goiter.  Heart: Regular rate and rhythm, without murmurs, rubs, gallops.  Lungs: Crackles BL worse at bases, bilateral air movement.  Abdomen: Soft, nontender, nondistended, positive bowel sounds.  Neuro: Grossly intact, nonfocal.   Lab Results:  [facility] 05/17/11 0310 05/16/11 0308  NA 138 134*  K 3.4* 3.3*  CL 111 105  CO2 21 19  GLUCOSE 132* 127*  BUN 30* 32*  CREATININE 0.94 1.08  CALCIUM 8.5 8.2*  MG -- --  PHOS -- --    Basename 05/15/11 0820 05/14/11 1620  AST 112* 92*  ALT 77* 63*  ALKPHOS 72 55  BILITOT 1.8* 2.0*  PROT 6.8 5.9*  ALBUMIN 2.9* 2.6*   No results found for this basename: LIPASE:2,AMYLASE:2 in the last 72 hours  Basename 05/17/11 0310 05/16/11 1205  WBC 14.9* 21.9*  NEUTROABS -- 20.6*  HGB 11.8* 12.2*  HCT 32.6* 35.0*  MCV 95.3 96.2  PLT 71* 76*    Basename 05/15/11 0820 05/15/11 0056 05/14/11 1620  CKTOTAL 534* 549* 382*  CKMB 20.4* 15.9* 10.2*  CKMBINDEX -- -- --  TROPONINI 0.88* 0.74* 0.45*   No components found with this basename: POCBNP:3 No results found for this basename: DDIMER:2 in the last 72 hours No results found for this basename: HGBA1C:2 in the last 72 hours  Basename  05/15/11 0820  CHOL 106  HDL 52  LDLCALC 43  TRIG 55  CHOLHDL 2.0  LDLDIRECT --   No results found for this basename: TSH,T4TOTAL,FREET3,T3FREE,THYROIDAB in the last 72 hours No results found for this basename: VITAMINB12:2,FOLATE:2,FERRITIN:2,TIBC:2,IRON:2,RETICCTPCT:2 in the last 72 hours  Micro Results: Recent Results (from the past 240 hour(s))  CULTURE, BLOOD (SINGLE)     Status: Normal (Preliminary result)   Collection Time   05/14/11  4:53 AM      Component Value Range Status Comment   Specimen Description BLOOD RIGHT ARM   Final    Special Requests BOTTLES DRAWN AEROBIC AND ANAEROBIC 5CC   Final    Setup Time [id number]   Final    Culture     Final    Value:        BLOOD CULTURE RECEIVED NO GROWTH TO DATE CULTURE WILL BE HELD FOR 5 DAYS BEFORE ISSUING A FINAL NEGATIVE REPORT   Report Status PENDING   Incomplete   CULTURE, BLOOD (ROUTINE X 2)     Status: Normal (Preliminary result)   Collection Time   05/14/11  8:34 AM      Component Value Range Status Comment   Specimen Description  Final    Value: BLOOD BOTTLES DRAWN AEROBIC AND ANAEROBIC LEFT HAND   Special Requests NONE [facility] EACH   Final    Setup Time [id number]   Final    Culture     Final    Value:        BLOOD CULTURE RECEIVED NO GROWTH TO DATE CULTURE WILL BE HELD FOR 5 DAYS BEFORE ISSUING A FINAL NEGATIVE REPORT   Report Status PENDING   Incomplete   CULTURE, BLOOD (ROUTINE X 2)     Status: Normal (Preliminary result)   Collection Time   05/14/11  9:13 AM      Component Value Range Status Comment   Specimen Description     Final    Value: BLOOD BOTTLES DRAWN AEROBIC AND ANAEROBIC RIGHT ARM   Special Requests NONE [facility] EACH   Final    Setup Time [id number]   Final    Culture     Final    Value:        BLOOD CULTURE RECEIVED NO GROWTH TO DATE CULTURE WILL BE HELD FOR 5 DAYS BEFORE ISSUING A FINAL NEGATIVE REPORT   Report Status PENDING   Incomplete   MRSA PCR SCREENING     Status: Normal   Collection  Time   05/14/11  4:20 PM      Component Value Range Status Comment   MRSA by PCR NEGATIVE  NEGATIVE  Final   CULTURE, SPUTUM-ASSESSMENT     Status: Normal   Collection Time   05/15/11  6:11 AM      Component Value Range Status Comment   Specimen Description SPUTUM   Final    Special Requests NONE   Final    Sputum evaluation     Final    Value: THIS SPECIMEN IS ACCEPTABLE. RESPIRATORY CULTURE REPORT TO FOLLOW.   Report Status 05/15/2011 FINAL   Final   CULTURE, RESPIRATORY     Status: Normal   Collection Time   05/15/11  6:11 AM      Component Value Range Status Comment   Specimen Description SPUTUM   Final    Special Requests NONE   Final    Gram Stain     Final    Value: FEW WBC PRESENT, PREDOMINANTLY PMN     RARE SQUAMOUS EPITHELIAL CELLS PRESENT     RARE GRAM POSITIVE COCCI     IN PAIRS   Culture NORMAL OROPHARYNGEAL FLORA   Final    Report Status 05/17/2011 FINAL   Final     Studies/Results: Dg Chest Port 1 View  05/15/2011  *RADIOLOGY REPORT*  Clinical Data: Shortness of breath, weakness  PORTABLE CHEST - 1 VIEW  Comparison: Chest x-ray of 05/14/2011  Findings: There has been significant worsening of opacities at both lung bases most consistent with pneumonia and probable effusions. Mild superimposed edema cannot be excluded.  The heart is mildly enlarged.  No bony abnormality is seen.  IMPRESSION: Probable bilateral lower lobe pneumonia with effusions.  Cannot exclude superimposed edema.  Original Report Authenticated By: Juline Patch, M.D.    Medications: I have reviewed the patient's current medications.   Patient Active Hospital Problem List: Encephalopathy (05/15/2011) Resolving on current abx regimen.  Likely related to pneumonia.  Will continue to monitory. Patient was much more interactive today.  Will transfer to floor today given patient's improve condition.  Atrial fibrillation (10/24/2010) Pt NSR today   Benign hypertensive heart disease without heart  failure (10/24/2010) Initially patient was hypotensive  on presentation to the ED.  Currently patient's blood pressure is holding steady  Chronic ischemic heart disease (10/24/2010) Stable   Community acquired pneumonia (05/14/2011) -Resolving on current abx regimen.  Will plan on continuing and following WBC counts  NSTEMI (non-ST elevated myocardial infarction) (05/14/2011) Stable no chest pain reported.  Cards on board  Influenza A (05/15/2011) - cont tamiflu  Thrombocytopenia (05/15/2011) Stable  Hypokalemia (05/16/2011) Slightly improved from yesterday will plan on replacing today.     LOS: 4 days   Penny Pia M.D.  [facility] 05/17/2011, 8:59 AM

## 2011-05-17 NOTE — Progress Notes (Signed)
Subjective:  Patient rested well last night with doxepin. Still very weak when nurses attempted to get him out of bed.  Coughing dark sputum.  Objective:  Vital Signs in the last 24 hours: Temp:  [97.3 F (36.3 C)-99.4 F (37.4 C)] 97.3 F (36.3 C) (12/28 0800) Pulse Rate:  [61-82] 80  (12/28 0400) Resp:  [17-22] 21  (12/28 0400) BP: (120-149)/(65-82) 124/68 mmHg (12/28 0400) SpO2:  [94 %-100 %] 94 % (12/28 0400)  Intake/Output from previous day: 12/27 0701 - 12/28 0700 In: 591.7 [I.V.:591.7] Out: 845 [Urine:845] Intake/Output from this shift:       . aspirin  81 mg Oral 3 times weekly  . docusate sodium  100 mg Oral BID  . doxepin  25 mg Oral QHS  . guaiFENesin  600 mg Oral BID  . hydrocortisone sod succinate (SOLU-CORTEF) injection  50 mg Intravenous Q8H  . metoprolol tartrate  12.5 mg Oral BID  . oseltamivir  75 mg Oral Daily  . piperacillin-tazobactam (ZOSYN)  IV  3.375 g Intravenous Q8H  . potassium chloride  40 mEq Oral Once  . senna  1 tablet Oral BID  . vancomycin  1,000 mg Intravenous Q24H      . sodium chloride 100 mL/hr at 05/16/11 1205    Physical Exam: The patient appears to be in no distress.  Head and neck exam reveals that the pupils are equal and reactive.  The extraocular movements are full.  There is no scleral icterus.  Mouth and pharynx are benign.  No lymphadenopathy.  No carotid bruits.  The jugular venous pressure is normal.  Thyroid is not enlarged or tender.  Chest:  Bilateral rhonchi  Heart reveals no abnormal lift or heave.  First and second heart sounds are normal.  There is no murmur gallop rub or click. Rhythm is irregular.  The abdomen is soft and nontender.  Bowel sounds are normoactive.  There is no hepatosplenomegaly or mass.  There are no abdominal bruits.  Extremities reveal no phlebitis or edema.  Pedal pulses are good.  There is no cyanosis or clubbing.  Neurologic exam is normal strength and no lateralizing weakness.   No sensory deficits. Mentation improved.  Integument reveals no rash  Lab Results:  Basename 05/17/11 0310 05/16/11 1205  WBC 14.9* 21.9*  HGB 11.8* 12.2*  PLT 71* 76*    Basename 05/17/11 0310 05/16/11 0308  NA 138 134*  K 3.4* 3.3*  CL 111 105  CO2 21 19  GLUCOSE 132* 127*  BUN 30* 32*  CREATININE 0.94 1.08    Basename 05/15/11 0820 05/15/11 0056  TROPONINI 0.88* 0.74*   Hepatic Function Panel  Basename 05/15/11 0820  PROT 6.8  ALBUMIN 2.9*  AST 112*  ALT 77*  ALKPHOS 72  BILITOT 1.8*  BILIDIR --  IBILI --    Basename 05/15/11 0820  CHOL 106   No results found for this basename: PROTIME in the last 72 hours  Imaging: No new studies Cardiac Studies: 2D echo shows normal EF and mild pulmonary hypertension. Assessment/Plan:  Patient Active Hospital Problem List: Encephalopathy (05/15/2011)   Assessment: Halllucinations resolved with resumption of doxepin.   Plan: Continue current therapy of pneumonia.   Atrial fibrillation (10/24/2010)   Assessment: Rhythm regular at rest.  EKG yesterday showed atrial flutter/fib.  Not coumadin candidate.   Plan: Check EKG Benign hypertensive heart disease without heart failure (10/24/2010)   Assessment: BP normal since correction of dehydration.   Plan: Continue current Rx  Chronic ischemic heart disease (10/24/2010)   Assessment: NonSTEMI secondary to demand ischemia from dehydration/hypotension/pneumonia   Plan: ASA, BB, Conservative Rx. Community acquired pneumonia (05/14/2011)   Assessment: Per primary   Plan: continue Tamiflu and antibiotics per primary NSTEMI (non-ST elevated myocardial infarction) (05/14/2011)   Assessment: Conservative Rx   Plan: ASA. BB Influenza A (05/15/2011)   Assessment: Improving   Plan: Tamiflu    LOS: 4 days    [name] 05/17/2011, 8:50 AM

## 2011-05-17 NOTE — Progress Notes (Signed)
Occupational Therapy Evaluation Patient Details Name: [name] MRN: [id number] DOB: 10/13/1911 Today's Date: 05/17/2011 1018 [id number] ev2  Problem List:  Patient Active Problem List  Diagnoses  . Atrial fibrillation  . Benign hypertensive heart disease without heart failure  . Chronic ischemic heart disease  . Inguinal hernia  . Community acquired pneumonia  . NSTEMI (non-ST elevated myocardial infarction)  . Influenza A  . Encephalopathy  . Thrombocytopenia  . Hypokalemia    Past Medical History:  Past Medical History  Diagnosis Date  . Atrial fibrillation     Not on Coumadin due to recurrent, severe GIB  . H/O: GI bleed   . Inguinal hernia   . Spinal stenosis     S/p surgery   . MI, old 76  . Diverticulitis   . BPH (benign prostatic hyperplasia)    Past Surgical History:  Past Surgical History  Procedure Date  . Spine surgery     Spinal stenosis surgery with Dr. Darrelyn Hillock   . Cholecystectomy   . Total hip arthroplasty     Right     OT Assessment/Plan/Recommendation OT Assessment Clinical Impression Statement: this 77 yo male was very active pta, including working part time and driving.  he was admitted with encephalopathy .  He is limited with ADLs secondary to dyspnea.  He will benefit from skilled OT in acute with min A to min guard goals  OT Recommendation/Assessment: Patient will need skilled OT in the acute care venue OT Problem List: Decreased strength;Decreased activity tolerance;Impaired balance (sitting and/or standing);Cardiopulmonary status limiting activity Barriers to Discharge: Decreased caregiver support OT Therapy Diagnosis : Generalized weakness OT Plan OT Frequency: Min 2X/week OT Recommendation Follow Up Recommendations: Skilled nursing facility;Other (comment) (would benefit from skilled at [facility] prior to independent living) Equipment Recommended: Defer to next venue Individuals Consulted Consulted and Agree with Results and  Recommendations: Patient;Family member/caregiver Family Member Consulted: daughter OT Goals Acute Rehab OT Goals OT Goal Formulation: With patient Time For Goal Achievement: 2 weeks ADL Goals Pt Will Perform Lower Body Bathing: with min assist;Sit to stand from chair;Other (comment) (min guard) ADL Goal: Lower Body Bathing - Progress: Progressing toward goals Pt Will Perform Lower Body Dressing: with min assist;Sit to stand from chair;with adaptive equipment ADL Goal: Lower Body Dressing - Progress: Progressing toward goals Pt Will Transfer to Toilet: with min assist;Other (comment);Comfort height toilet;Ambulation (min guard) ADL Goal: Toilet Transfer - Progress: Progressing toward goals Pt Will Perform Toileting - Clothing Manipulation: with min assist;Other (comment);Standing (min guard) ADL Goal: Toileting - Clothing Manipulation - Progress: Progressing toward goals Pt Will Perform Toileting - Hygiene: with min assist;Other (comment);Standing at 3-in-1/toilet (min guard) ADL Goal: Toileting - Hygiene - Progress: Progressing toward goals Miscellaneous OT Goals Miscellaneous OT Goal #1: pt will gather clothes with RW with min A OT Goal: Miscellaneous Goal #1 - Progress: Progressing toward goals  OT Evaluation Precautions/Restrictions  Precautions Precautions: Fall Restrictions Weight Bearing Restrictions: No Prior Functioning Home Living Lives With: Alone Receives Help From: Family Type of Home: Apartment Home Layout: One level Home Access: Level entry Bathroom Shower/Tub: Health visitor: Handicapped height Bathroom Accessibility: Yes How Accessible: Accessible via walker Home Adaptive Equipment: Grab bars in shower;Walker - rolling;Walker - four wheeled Additional Comments: works at office daily and goes to gym at [employer] Prior Function Level of Independence: Independent with basic ADLs;Independent with homemaking with ambulation;Independent with  gait Driving: Yes Vocation: Part time employment ADL ADL Eating/Feeding: Simulated;Independent Where Assessed - Eating/Feeding:  Chair Grooming: Simulated;Set up Where Assessed - Grooming: Sitting, chair;Supported Upper Body Bathing: Simulated;Set up Where Assessed - Upper Body Bathing: Supported;Sitting, chair Lower Body Bathing: Simulated;+2 Total assistance;Comment for patient % (75) Where Assessed - Lower Body Bathing: Sit to stand from chair Upper Body Dressing: Simulated;Minimal assistance Where Assessed - Upper Body Dressing: Sitting, chair;Supported Lower Body Dressing: Simulated;+2 Total assistance;Comment for patient % (50) Lower Body Dressing Details (indicate cue type and reason): pt usually uses sock aid Where Assessed - Lower Body Dressing: Sit to stand from chair Toilet Transfer: Simulated;+2 Total assistance;Comment for patient % (75%; cotx with PT bed to chair) Toilet Transfer Method: Ambulating Toileting - Clothing Manipulation: Simulated;Minimal assistance Where Assessed - Glass blower/designer Manipulation: Standing Toileting - Hygiene: Simulated;Minimal assistance Where Assessed - Toileting Hygiene: Sit to stand from 3-in-1 or toilet Equipment Used: Rolling walker ADL Comments: pt feels dyspnea wtih exertion (2/4). sats remained in mid-high 90s Vision/Perception  Vision - History Patient Visual Report: No change from baseline Cognition Cognition Arousal/Alertness: Awake/alert Overall Cognitive Status: Appears within functional limits for tasks assessed Orientation Level: Oriented to person;Oriented to place;Oriented to situation Sensation/Coordination   Extremity Assessment RUE Assessment RUE Assessment: Within Functional Limits LUE Assessment LUE Assessment: Within Functional Limits Mobility  Bed Mobility Bed Mobility: Yes Supine to Sit: 1: +2 Total assist;HOB elevated (Comment degrees);With rails Supine to Sit Details (indicate cue type and reason):  70% Transfers Sit to Stand: 1: +2 Total assist;From elevated surface;From bed;Patient percentage (comment) (70) Sit to Stand Details (indicate cue type and reason): pt stood at RW pt=70 Stand to Sit: To chair/3-in-1;With armrests;3: Mod assist Stand to Sit Details: vc to reach baack, but pt did not do so Exercises   End of Session OT - End of Session Activity Tolerance: Patient limited by fatigue;Other (comment) (dyspnea) Patient left: in chair;with call bell in reach General Behavior During Session: [facility] for tasks performed Cognition: [facility] for tasks performed   Azoria Abbett [phone] 05/17/2011, 1:15 PM

## 2011-05-17 NOTE — Progress Notes (Signed)
Report received from Melissa, RN. No change from initial am assessment. Will continue to follow the plan of care. 

## 2011-05-17 NOTE — Progress Notes (Deleted)
Physical Therapy Treatment Patient Details Name: [name] MRN: [id number] DOB: 1911/11/25 Today's Date: 05/17/[id number] 2ta g PT Assessment/Plan  PT - Assessment/Plan Comments on Treatment Session: pt tolerated slight increase in activity today. continues to be very weak, sats remained  above 90 on 2 l.  pt will benefit from SNF at discharge. daughter present and agrees. PT Plan: Discharge plan remains appropriate;Frequency remains appropriate PT Frequency: Min 3X/week Follow Up Recommendations: Skilled nursing facility Equipment Recommended: Defer to next venue PT Goals  Acute Rehab PT Goals PT Goal Formulation: With patient/family Time For Goal Achievement: 2 weeks Pt will go Supine/Side to Sit: with supervision PT Goal: Supine/Side to Sit - Progress: Progressing toward goal Pt will go Sit to Supine/Side: with supervision PT Goal: Sit to Supine/Side - Progress: Progressing toward goal Pt will go Stand to Sit: with supervision PT Goal: Stand to Sit - Progress: Progressing toward goal Pt will Transfer Bed to Chair/Chair to Bed: with min assist PT Transfer Goal: Bed to Chair/Chair to Bed - Progress: Progressing toward goal Pt will Ambulate: 51 - 150 feet;with min assist  PT Treatment Precautions/Restrictions  Precautions Precautions: Fall Restrictions Weight Bearing Restrictions: No Mobility (including Balance) Bed Mobility Bed Mobility: Yes Supine to Sit: 1: +2 Total assist;HOB elevated (Comment degrees);With rails Supine to Sit Details (indicate cue type and reason): pt=70 needed to pull up on PT hand Transfers Transfers: Yes Sit to Stand: 1: +2 Total assist;From elevated surface;From bed Sit to Stand Details (indicate cue type and reason): pt stood at RW pt=70 Stand to Sit: To chair/3-in-1;With armrests;3: Mod assist Stand to Sit Details: vc to reach baack, but pt did not do so Ambulation/Gait Ambulation/Gait: Yes Ambulation/Gait Assistance: 1: +2 Total  assist Ambulation/Gait Assistance Details (indicate cue type and reason): with RW, base of support was less wide than 12/27 and gait was more steady, pt requested to stand and "warm up" prior to walking Ambulation Distance (Feet): 6 Feet Assistive device: Rolling walker Gait Pattern: Step-through pattern  Posture/Postural Control Posture/Postural Control: No significant limitations Exercise    End of Session PT - End of Session Equipment Utilized During Treatment: Gait belt Activity Tolerance: Patient tolerated treatment well Patient left: in chair;with call bell in reach;with family/visitor present General Behavior During Session: [facility] for tasks performed Cognition: Impaired (pt hallucinating about many pills in his lap)  Rada Hay 05/17/2011, 1:00 PM

## 2011-05-17 NOTE — Progress Notes (Signed)
Physical Therapy Treatment Patient Details Name: [name] MRN: [id number] DOB: 06-Nov-1911 Today's Date: 05/17/2011  PT Assessment/Plan  PT - Assessment/Plan Comments on Treatment Session: pt tolerated slight increase in activity today. continues to be very weak, sats remained  above 90 on 2 l.  pt will benefit from SNF at discharge. daughter present and agrees. PT Plan: Discharge plan remains appropriate;Frequency remains appropriate PT Frequency: Min 3X/week Follow Up Recommendations: Skilled nursing facility Equipment Recommended: Defer to next venue PT Goals  Acute Rehab PT Goals PT Goal Formulation: With patient/family Time For Goal Achievement: 2 weeks Pt will go Supine/Side to Sit: with supervision PT Goal: Supine/Side to Sit - Progress: Progressing toward goal Pt will go Sit to Supine/Side: with supervision PT Goal: Sit to Supine/Side - Progress: Progressing toward goal Pt will go Stand to Sit: with supervision PT Goal: Stand to Sit - Progress: Progressing toward goal Pt will Transfer Bed to Chair/Chair to Bed: with min assist PT Transfer Goal: Bed to Chair/Chair to Bed - Progress: Progressing toward goal Pt will Ambulate: 51 - 150 feet;with min assist  PT Treatment Precautions/Restrictions  Precautions Precautions: Fall Restrictions Weight Bearing Restrictions: No Mobility (including Balance) Bed Mobility Bed Mobility: Yes Supine to Sit: 1: +2 Total assist;HOB elevated (Comment degrees);With rails Supine to Sit Details (indicate cue type and reason): pt=72 needed to pull up on PT hand Transfers Transfers: Yes Sit to Stand: 1: +2 Total assist;From elevated surface;From bed Sit to Stand Details (indicate cue type and reason): pt stood at RW pt=70 Stand to Sit: To chair/3-in-1;With armrests;3: Mod assist Stand to Sit Details: vc to reach back, but pt did not do so Ambulation/Gait Ambulation/Gait: Yes Ambulation/Gait Assistance: 1: +2 Total  assist Ambulation/Gait Assistance Details (indicate cue type and reason): with RW, base of support was less wide than 12/27 and gait was more steady, pt requested to stand and "warm up" prior to walking Ambulation Distance (Feet): 6 Feet Assistive device: Rolling walker Gait Pattern: Step-through pattern  Posture/Postural Control Posture/Postural Control: No significant limitations Exercise    End of Session PT - End of Session Equipment Utilized During Treatment: Gait belt Activity Tolerance: Patient tolerated treatment well Patient left: in chair;with call bell in reach;with family/visitor present General Behavior During Session: [facility] for tasks performed Cognition: appears [facility] for information  Rada Hay 05/17/2011, 1:04 PM

## 2011-05-18 LAB — BASIC METABOLIC PANEL
BUN: 25 mg/dL — ABNORMAL HIGH (ref 6–23)
Chloride: 109 mEq/L (ref 96–112)
GFR calc Af Amer: 80 mL/min — ABNORMAL LOW (ref >90)
Potassium: 4.3 mEq/L (ref 3.5–5.1)

## 2011-05-18 LAB — CBC
HCT: 35.3 % — ABNORMAL LOW (ref 39.0–52.0)
RDW: 15.8 % — ABNORMAL HIGH (ref 11.5–15.5)
WBC: 10.5 10*3/uL (ref 4.0–10.5)

## 2011-05-18 LAB — GLUCOSE, CAPILLARY: Glucose-Capillary: 117 mg/dL — ABNORMAL HIGH (ref 70–99)

## 2011-05-18 MED ORDER — OSELTAMIVIR PHOSPHATE 75 MG PO CAPS
75.0000 mg | ORAL_CAPSULE | Freq: Two times a day (BID) | ORAL | Status: DC
  Administered 2011-05-18 – 2011-05-23 (×9): 75 mg via ORAL
  Filled 2011-05-18 (×12): qty 1

## 2011-05-18 MED ORDER — LEVALBUTEROL TARTRATE 45 MCG/ACT IN AERO
1.0000 | INHALATION_SPRAY | Freq: Four times a day (QID) | RESPIRATORY_TRACT | Status: DC | PRN
  Administered 2011-05-19: 2 via RESPIRATORY_TRACT
  Filled 2011-05-18: qty 15

## 2011-05-18 MED ORDER — HYDROCORTISONE SOD SUCCINATE 100 MG IJ SOLR
25.0000 mg | Freq: Two times a day (BID) | INTRAMUSCULAR | Status: DC
  Administered 2011-05-19 – 2011-05-20 (×3): 25 mg via INTRAVENOUS
  Filled 2011-05-18 (×6): qty 0.5

## 2011-05-18 NOTE — Progress Notes (Signed)
ANTIBIOTIC CONSULT NOTE - follow up  Pharmacy Consult for Vanc/Zosyn Indication: Pneumonia  No Known Allergies  Patient Measurements: Height: 5\' 8"  (172.7 cm) Weight:  (cannot get the scale to work on bed, pt to weak to stand.) IBW/kg (Calculated) : 68.4   Vital Signs: Temp: 97.3 F (36.3 C) (12/29 0455) Temp src: Oral (12/29 0455) BP: 170/96 mmHg (12/29 0748) Pulse Rate: 75  (12/29 0748) Intake/Output from previous day: 12/28 0701 - 12/29 0700 In: 2240 [P.O.:240; I.V.:1700; IV Piggyback:300] Out: 616 [Urine:615; Stool:1] Intake/Output from this shift: Total I/O In: 120 [P.O.:120] Out: -   Labs:  Basename 05/18/11 0415 05/17/11 0310 05/16/11 1205 05/16/11 0308  WBC 10.5 14.9* 21.9* --  HGB 12.2* 11.8* 12.2* --  PLT 92* 71* 76* --  LABCREA -- -- -- --  CREATININE 0.89 0.94 -- 1.08   Estimated Creatinine Clearance: 45.9 ml/min (by C-G formula based on Cr of 0.89). No results found for this basename: VANCOTROUGH:2,VANCOPEAK:2,VANCORANDOM:2,GENTTROUGH:2,GENTPEAK:2,GENTRANDOM:2,TOBRATROUGH:2,TOBRAPEAK:2,TOBRARND:2,AMIKACINPEAK:2,AMIKACINTROU:2,AMIKACIN:2, in the last 72 hours   Microbiology: Recent Results (from the past 720 hour(s))  CULTURE, BLOOD (SINGLE)     Status: Normal (Preliminary result)   Collection Time   05/14/11  4:53 AM      Component Value Range Status Comment   Specimen Description BLOOD RIGHT ARM   Final    Special Requests BOTTLES DRAWN AEROBIC AND ANAEROBIC 5CC   Final    Setup Time [id number]   Final    Culture     Final    Value:        BLOOD CULTURE RECEIVED NO GROWTH TO DATE CULTURE WILL BE HELD FOR 5 DAYS BEFORE ISSUING A FINAL NEGATIVE REPORT   Report Status PENDING   Incomplete   CULTURE, BLOOD (ROUTINE X 2)     Status: Normal (Preliminary result)   Collection Time   05/14/11  8:34 AM      Component Value Range Status Comment   Specimen Description     Final    Value: BLOOD BOTTLES DRAWN AEROBIC AND ANAEROBIC LEFT HAND   Special  Requests NONE 5CC EACH   Final    Setup Time [id number]   Final    Culture     Final    Value:        BLOOD CULTURE RECEIVED NO GROWTH TO DATE CULTURE WILL BE HELD FOR 5 DAYS BEFORE ISSUING A FINAL NEGATIVE REPORT   Report Status PENDING   Incomplete   CULTURE, BLOOD (ROUTINE X 2)     Status: Normal (Preliminary result)   Collection Time   05/14/11  9:13 AM      Component Value Range Status Comment   Specimen Description     Final    Value: BLOOD BOTTLES DRAWN AEROBIC AND ANAEROBIC RIGHT ARM   Special Requests NONE [facility] EACH   Final    Setup Time [id number]   Final    Culture     Final    Value:        BLOOD CULTURE RECEIVED NO GROWTH TO DATE CULTURE WILL BE HELD FOR 5 DAYS BEFORE ISSUING A FINAL NEGATIVE REPORT   Report Status PENDING   Incomplete   MRSA PCR SCREENING     Status: Normal   Collection Time   05/14/11  4:20 PM      Component Value Range Status Comment   MRSA by PCR NEGATIVE  NEGATIVE  Final   CULTURE, SPUTUM-ASSESSMENT     Status: Normal   Collection Time   05/15/11  6:11 AM      Component Value Range Status Comment   Specimen Description SPUTUM   Final    Special Requests NONE   Final    Sputum evaluation     Final    Value: THIS SPECIMEN IS ACCEPTABLE. RESPIRATORY CULTURE REPORT TO FOLLOW.   Report Status 05/15/2011 FINAL   Final   CULTURE, RESPIRATORY     Status: Normal   Collection Time   05/15/11  6:11 AM      Component Value Range Status Comment   Specimen Description SPUTUM   Final    Special Requests NONE   Final    Gram Stain     Final    Value: FEW WBC PRESENT, PREDOMINANTLY PMN     RARE SQUAMOUS EPITHELIAL CELLS PRESENT     RARE GRAM POSITIVE COCCI     IN PAIRS   Culture NORMAL OROPHARYNGEAL FLORA   Final    Report Status 05/17/2011 FINAL   Final     Medical History: Past Medical History  Diagnosis Date  . Atrial fibrillation     Not on Coumadin due to recurrent, severe GIB  . H/O: GI bleed   . Inguinal hernia   . Spinal stenosis      S/p surgery   . MI, old 53  . Diverticulitis   . BPH (benign prostatic hyperplasia)     Medications:  Scheduled:     . aspirin  81 mg Oral 3 times weekly  . docusate sodium  100 mg Oral BID  . doxepin  25 mg Oral QHS  . guaiFENesin  600 mg Oral BID  . hydrocortisone sod succinate (SOLU-CORTEF) injection  50 mg Intravenous Q8H  . metoprolol tartrate  12.5 mg Oral BID  . oseltamivir  75 mg Oral Daily  . piperacillin-tazobactam (ZOSYN)  IV  3.375 g Intravenous Q8H  . senna  1 tablet Oral BID  . vancomycin  1,000 mg Intravenous Q24H   Infusions:     . sodium chloride 100 mL/hr at 05/18/11 0700   Assessment:  89 YOM on Vanc/Zosyn for SIRS  Vanc/Zosyn protocol missed 12/25   D5 Tamiflu 75mg  daily for + Influenza A.   Stress dose steroids started 12/26.    Afebrile. WBC improving. SCr wnl   12/25 Blcx: ngtd MRSA PCR: neg 12/26 Resp cx: NF  Goal of Therapy:  Vancomycin trough level 15-20 mcg/ml  Plan:   Continue Vanc 1gm IV q24h  Continue Zosyn 3.375g IV Q8H infused over 4hrs.  ? Patient on prophylactic dose of Tamiflu with positive nasal swab. Consider increasing Tamiflu to 75mg  PO BID for up to 5 days more.  F/u renal fxn, cultures.  Vanc trough in the am 12/30.  Reece Packer 05/18/2011,1:01 PM

## 2011-05-18 NOTE — Progress Notes (Signed)
Subjective: Pt states that he feels better today.  With exertion he states that he gets short of breath. Otherwise no complaints.  Nursing reports that he has not had any hypotensive readings. No acute issues overnight  Objective: Filed Vitals:   05/18/11 0138 05/18/11 0455 05/18/11 0748 05/18/11 1357  BP:  179/85 170/96 157/72  Pulse:  73 75 93  Temp:  97.3 F (36.3 C)  97.2 F (36.2 C)  TempSrc:  Oral  Axillary  Resp:  16  16  Height:      Weight:      SpO2: 93% 96% 98% 94%   Weight change:   Intake/Output Summary (Last 24 hours) at 05/18/11 2106 Last data filed at 05/18/11 1700  Gross per 24 hour  Intake   1780 ml  Output   1700 ml  Net     80 ml    General: Alert, awake, oriented x3, in no acute distress.  HEENT: No bruits, no goiter.  Heart: Regular rate and rhythm, without murmurs, rubs, gallops.  Lungs: Crackles BL worse at bases, bilateral air movement.  Abdomen: Soft, nontender, nondistended, positive bowel sounds.  Neuro: Grossly intact, nonfocal.   Lab Results:  Basename 05/18/11 0415 05/17/11 0310  NA 141 138  K 4.3 3.4*  CL 109 111  CO2 24 21  GLUCOSE 124* 132*  BUN 25* 30*  CREATININE 0.89 0.94  CALCIUM 8.9 8.5  MG -- --  PHOS -- --   No results found for this basename: AST:2,ALT:2,ALKPHOS:2,BILITOT:2,PROT:2,ALBUMIN:2 in the last 72 hours No results found for this basename: LIPASE:2,AMYLASE:2 in the last 72 hours  Basename 05/18/11 0415 05/17/11 0310 05/16/11 1205  WBC 10.5 14.9* --  NEUTROABS -- -- 20.6*  HGB 12.2* 11.8* --  HCT 35.3* 32.6* --  MCV 95.9 95.3 --  PLT 92* 71* --   No results found for this basename: CKTOTAL:3,CKMB:3,CKMBINDEX:3,TROPONINI:3 in the last 72 hours No components found with this basename: POCBNP:3 No results found for this basename: DDIMER:2 in the last 72 hours No results found for this basename: HGBA1C:2 in the last 72 hours No results found for this basename: CHOL:2,HDL:2,LDLCALC:2,TRIG:2,CHOLHDL:2,LDLDIRECT:2  in the last 72 hours No results found for this basename: TSH,T4TOTAL,FREET3,T3FREE,THYROIDAB in the last 72 hours No results found for this basename: VITAMINB12:2,FOLATE:2,FERRITIN:2,TIBC:2,IRON:2,RETICCTPCT:2 in the last 72 hours  Micro Results: Recent Results (from the past 240 hour(s))  CULTURE, BLOOD (SINGLE)     Status: Normal (Preliminary result)   Collection Time   05/14/11  4:53 AM      Component Value Range Status Comment   Specimen Description BLOOD RIGHT ARM   Final    Special Requests BOTTLES DRAWN AEROBIC AND ANAEROBIC 5CC   Final    Setup Time [id number]   Final    Culture     Final    Value:        BLOOD CULTURE RECEIVED NO GROWTH TO DATE CULTURE WILL BE HELD FOR 5 DAYS BEFORE ISSUING A FINAL NEGATIVE REPORT   Report Status PENDING   Incomplete   CULTURE, BLOOD (ROUTINE X 2)     Status: Normal (Preliminary result)   Collection Time   05/14/11  8:34 AM      Component Value Range Status Comment   Specimen Description     Final    Value: BLOOD BOTTLES DRAWN AEROBIC AND ANAEROBIC LEFT HAND   Special Requests NONE [facility] EACH   Final    Setup Time [id number]   Final    Culture  Final    Value:        BLOOD CULTURE RECEIVED NO GROWTH TO DATE CULTURE WILL BE HELD FOR 5 DAYS BEFORE ISSUING A FINAL NEGATIVE REPORT   Report Status PENDING   Incomplete   CULTURE, BLOOD (ROUTINE X 2)     Status: Normal (Preliminary result)   Collection Time   05/14/11  9:13 AM      Component Value Range Status Comment   Specimen Description     Final    Value: BLOOD BOTTLES DRAWN AEROBIC AND ANAEROBIC RIGHT ARM   Special Requests NONE [facility] EACH   Final    Setup Time [id number]   Final    Culture     Final    Value:        BLOOD CULTURE RECEIVED NO GROWTH TO DATE CULTURE WILL BE HELD FOR 5 DAYS BEFORE ISSUING A FINAL NEGATIVE REPORT   Report Status PENDING   Incomplete   MRSA PCR SCREENING     Status: Normal   Collection Time   05/14/11  4:20 PM      Component Value Range Status  Comment   MRSA by PCR NEGATIVE  NEGATIVE  Final   CULTURE, SPUTUM-ASSESSMENT     Status: Normal   Collection Time   05/15/11  6:11 AM      Component Value Range Status Comment   Specimen Description SPUTUM   Final    Special Requests NONE   Final    Sputum evaluation     Final    Value: THIS SPECIMEN IS ACCEPTABLE. RESPIRATORY CULTURE REPORT TO FOLLOW.   Report Status 05/15/2011 FINAL   Final   CULTURE, RESPIRATORY     Status: Normal   Collection Time   05/15/11  6:11 AM      Component Value Range Status Comment   Specimen Description SPUTUM   Final    Special Requests NONE   Final    Gram Stain     Final    Value: FEW WBC PRESENT, PREDOMINANTLY PMN     RARE SQUAMOUS EPITHELIAL CELLS PRESENT     RARE GRAM POSITIVE COCCI     IN PAIRS   Culture NORMAL OROPHARYNGEAL FLORA   Final    Report Status 05/17/2011 FINAL   Final     Studies/Results: No results found.  Medications: I have reviewed the patient's current medications.   Patient Active Hospital Problem List: Encephalopathy (05/15/2011) Resolving on current abx regimen.  Likely related to pneumonia.   Much clinically improved   Atrial fibrillation (10/24/2010) Pt NSR, conitnue metoprolol   Benign hypertensive heart disease without heart failure (10/24/2010)  Chronic ischemic heart disease/NSTEMI (non-ST elevated myocardial infarction) (05/14/2011) -Stable no chest pain reported, coninue outpt meds.     Community acquired pneumonia (05/14/2011) -Resolving on current abx regimen.   -leukocytosis resolved, cultures no gowth to date will d/c vanc and follow - hyoptension resolved with begin tapering off stress dose steroids. I nfluenza A (05/15/2011) - cont tamiflu, changed to treatment dose  Thrombocytopenia (05/15/2011) Improving, no gross bleeding.  Hypokalemia (05/16/2011) resolved     LOS: 5 days   Kela Millin M.D.  [facility] 05/18/2011, 9:06 PM

## 2011-05-19 ENCOUNTER — Inpatient Hospital Stay (HOSPITAL_COMMUNITY): Payer: Medicare Other

## 2011-05-19 DIAGNOSIS — I119 Hypertensive heart disease without heart failure: Secondary | ICD-10-CM

## 2011-05-19 DIAGNOSIS — I4891 Unspecified atrial fibrillation: Secondary | ICD-10-CM

## 2011-05-19 DIAGNOSIS — E876 Hypokalemia: Secondary | ICD-10-CM

## 2011-05-19 LAB — GLUCOSE, CAPILLARY: Glucose-Capillary: 90 mg/dL (ref 70–99)

## 2011-05-19 LAB — BASIC METABOLIC PANEL WITH GFR
BUN: 18 mg/dL (ref 6–23)
CO2: 25 meq/L (ref 19–32)
Calcium: 7.9 mg/dL — ABNORMAL LOW (ref 8.4–10.5)
Chloride: 105 meq/L (ref 96–112)
Creatinine, Ser: 0.77 mg/dL (ref 0.50–1.35)
GFR calc Af Amer: 85 mL/min — ABNORMAL LOW
GFR calc non Af Amer: 74 mL/min — ABNORMAL LOW
Glucose, Bld: 99 mg/dL (ref 70–99)
Potassium: 2.6 meq/L — CL (ref 3.5–5.1)
Sodium: 138 meq/L (ref 135–145)

## 2011-05-19 LAB — MAGNESIUM: Magnesium: 1.8 mg/dL (ref 1.5–2.5)

## 2011-05-19 MED ORDER — POTASSIUM CHLORIDE CRYS ER 20 MEQ PO TBCR
30.0000 meq | EXTENDED_RELEASE_TABLET | Freq: Every day | ORAL | Status: AC
  Administered 2011-05-19: 30 meq via ORAL
  Filled 2011-05-19: qty 1

## 2011-05-19 MED ORDER — POTASSIUM CHLORIDE 10 MEQ/100ML IV SOLN
10.0000 meq | INTRAVENOUS | Status: AC
Start: 2011-05-19 — End: 2011-05-19
  Administered 2011-05-19 (×3): 10 meq via INTRAVENOUS
  Filled 2011-05-19 (×3): qty 100

## 2011-05-19 MED ORDER — POTASSIUM CHLORIDE CRYS ER 20 MEQ PO TBCR
40.0000 meq | EXTENDED_RELEASE_TABLET | Freq: Once | ORAL | Status: AC
  Administered 2011-05-19: 40 meq via ORAL
  Filled 2011-05-19: qty 2

## 2011-05-19 MED ORDER — ASPIRIN EC 81 MG PO TBEC
81.0000 mg | DELAYED_RELEASE_TABLET | ORAL | Status: DC
  Administered 2011-05-20 – 2011-05-22 (×2): 81 mg via ORAL
  Filled 2011-05-19 (×2): qty 1

## 2011-05-19 NOTE — Progress Notes (Signed)
Pt blood pressure 172/86. MD notified no new orders at this time will continue to monitor.

## 2011-05-19 NOTE — Progress Notes (Signed)
CRITICAL VALUE ALERT  Critical value received: k-2.6   Date of notification:  05/19/11  Time of notification:  0630  Critical value read back: yes  Nurse who received alert:  *Roosvelt Maser **  MD notified (1st page):  Benedetto Coons  Time of first page:  [phone]  MD notified (2nd page):  Time of second page:  Responding MD:  Benedetto Coons  Time MD responded:  [phone]

## 2011-05-19 NOTE — Progress Notes (Signed)
Subjective: Still some sob and cough, but better.c/o weakness. Objective: Filed Vitals:   05/18/11 0748 05/18/11 1357 05/18/11 2131 05/19/11 0544  BP: 170/96 157/72 164/94 172/86  Pulse: 75 93 84 83  Temp:  97.2 F (36.2 C) 98.4 F (36.9 C) 97.4 F (36.3 C)  TempSrc:  Axillary Axillary Axillary  Resp:  16 20 20   Height:      Weight:      SpO2: 98% 94% 94% 93%   Weight change:   Intake/Output Summary (Last 24 hours) at 05/19/11 1326 Last data filed at 05/19/11 1230  Gross per 24 hour  Intake   2670 ml  Output   4451 ml  Net  -1781 ml    General: Alert, awake, oriented x3, in no acute distress.  HEENT: No bruits, no goiter.  Heart: Regular rate and rhythm, without murmurs, rubs, gallops.  Lungs: few rhonchi, mod. bilateral air movement, no wheezes  Abdomen: Soft, nontender, nondistended, positive bowel sounds.  Neuro: Grossly intact, nonfocal.   Lab Results:  Basename 05/19/11 0506 05/18/11 0415  NA 138 141  K 2.6* 4.3  CL 105 109  CO2 25 24  GLUCOSE 99 124*  BUN 18 25*  CREATININE 0.77 0.89  CALCIUM 7.9* 8.9  MG 1.8 --  PHOS -- --   No results found for this basename: AST:2,ALT:2,ALKPHOS:2,BILITOT:2,PROT:2,ALBUMIN:2 in the last 72 hours No results found for this basename: LIPASE:2,AMYLASE:2 in the last 72 hours  Basename 05/18/11 0415 05/17/11 0310  WBC 10.5 14.9*  NEUTROABS -- --  HGB 12.2* 11.8*  HCT 35.3* 32.6*  MCV 95.9 95.3  PLT 92* 71*   No results found for this basename: CKTOTAL:3,CKMB:3,CKMBINDEX:3,TROPONINI:3 in the last 72 hours No components found with this basename: POCBNP:3 No results found for this basename: DDIMER:2 in the last 72 hours No results found for this basename: HGBA1C:2 in the last 72 hours No results found for this basename: CHOL:2,HDL:2,LDLCALC:2,TRIG:2,CHOLHDL:2,LDLDIRECT:2 in the last 72 hours No results found for this basename: TSH,T4TOTAL,FREET3,T3FREE,THYROIDAB in the last 72 hours No results found for this basename:  VITAMINB12:2,FOLATE:2,FERRITIN:2,TIBC:2,IRON:2,RETICCTPCT:2 in the last 72 hours  Micro Results: Recent Results (from the past 240 hour(s))  CULTURE, BLOOD (SINGLE)     Status: Normal (Preliminary result)   Collection Time   05/14/11  4:53 AM      Component Value Range Status Comment   Specimen Description BLOOD RIGHT ARM   Final    Special Requests BOTTLES DRAWN AEROBIC AND ANAEROBIC 5CC   Final    Setup Time [id number]   Final    Culture     Final    Value:        BLOOD CULTURE RECEIVED NO GROWTH TO DATE CULTURE WILL BE HELD FOR 5 DAYS BEFORE ISSUING A FINAL NEGATIVE REPORT   Report Status PENDING   Incomplete   CULTURE, BLOOD (ROUTINE X 2)     Status: Normal (Preliminary result)   Collection Time   05/14/11  8:34 AM      Component Value Range Status Comment   Specimen Description     Final    Value: BLOOD BOTTLES DRAWN AEROBIC AND ANAEROBIC LEFT HAND   Special Requests NONE [facility] EACH   Final    Setup Time [id number]   Final    Culture     Final    Value:        BLOOD CULTURE RECEIVED NO GROWTH TO DATE CULTURE WILL BE HELD FOR 5 DAYS BEFORE ISSUING A FINAL NEGATIVE REPORT  Report Status PENDING   Incomplete   CULTURE, BLOOD (ROUTINE X 2)     Status: Normal (Preliminary result)   Collection Time   05/14/11  9:13 AM      Component Value Range Status Comment   Specimen Description     Final    Value: BLOOD BOTTLES DRAWN AEROBIC AND ANAEROBIC RIGHT ARM   Special Requests NONE [facility] EACH   Final    Setup Time [id number]   Final    Culture     Final    Value:        BLOOD CULTURE RECEIVED NO GROWTH TO DATE CULTURE WILL BE HELD FOR 5 DAYS BEFORE ISSUING A FINAL NEGATIVE REPORT   Report Status PENDING   Incomplete   MRSA PCR SCREENING     Status: Normal   Collection Time   05/14/11  4:20 PM      Component Value Range Status Comment   MRSA by PCR NEGATIVE  NEGATIVE  Final   CULTURE, SPUTUM-ASSESSMENT     Status: Normal   Collection Time   05/15/11  6:11 AM      Component  Value Range Status Comment   Specimen Description SPUTUM   Final    Special Requests NONE   Final    Sputum evaluation     Final    Value: THIS SPECIMEN IS ACCEPTABLE. RESPIRATORY CULTURE REPORT TO FOLLOW.   Report Status 05/15/2011 FINAL   Final   CULTURE, RESPIRATORY     Status: Normal   Collection Time   05/15/11  6:11 AM      Component Value Range Status Comment   Specimen Description SPUTUM   Final    Special Requests NONE   Final    Gram Stain     Final    Value: FEW WBC PRESENT, PREDOMINANTLY PMN     RARE SQUAMOUS EPITHELIAL CELLS PRESENT     RARE GRAM POSITIVE COCCI     IN PAIRS   Culture NORMAL OROPHARYNGEAL FLORA   Final    Report Status 05/17/2011 FINAL   Final     Studies/Results: No results found.  Medications: I have reviewed the patient's current medications.   Patient Active Hospital Problem List: Encephalopathy (05/15/2011) Resolving on current abx regimen.  Likely related to pneumonia.   Much clinically improved   Atrial fibrillation (10/24/2010) Pt NSR, conitnue metoprolol   Benign hypertensive heart disease without heart failure (10/24/2010)  Chronic ischemic heart disease/NSTEMI (non-ST elevated myocardial infarction) (05/14/2011) -Stable no chest pain reported, coninue outpt meds.     Community acquired pneumonia (05/14/2011) -Resolving on current abx regimen.   -leukocytosis resolved, cultures no gowth to date will d/c vanc and follow - BP stable on tapering steroids doses, will change to PO in am PT OT following.  Influenza A (05/15/2011) - cont tamiflu  Thrombocytopenia (05/15/2011)  no gross bleeding, recheck in am.  Hypokalemia (05/16/2011) Replace k     LOS: 6 days   Kela Millin M.D.  [facility] 05/19/2011, 1:26 PM

## 2011-05-19 NOTE — Progress Notes (Deleted)
Pt blood pressure 172/86. MD notified no new orders at this time

## 2011-05-19 NOTE — Consult Note (Signed)
[place]  77 y.o.  male  Subjective: The patient complains of extreme weakness and malaise, which is not surprising considering the severity of his illness at presentation. He is clearly improved with decreased dyspnea and stabilization of blood pressure. He remains in atrial fibrillation with controlled ventricular response. He continues to receive IV fluids although he is eating fairly well and has a Foley catheter in place.  Allergy: Review of patient's allergies indicates no known allergies.  Objective: Vital signs in last 24 hours: Temp:  [97.2 F (36.2 C)-98.4 F (36.9 C)] 97.4 F (36.3 C) (12/30 0544) Pulse Rate:  [83-93] 83  (12/30 0544) Resp:  [16-20] 20  (12/30 0544) BP: (157-172)/(72-94) 172/86 mmHg (12/30 0544) SpO2:  [93 %-94 %] 93 % (12/30 0544)  160 lb 15 oz (73 kg) Body mass index is 24.47 kg/(m^2).  Weight change:  Last BM Date: 05/18/11  Intake/Output from previous day: 12/29 0701 - 12/30 0700 In: 3030 [P.O.:480; I.V.:2400; IV Piggyback:150] Out: 2950 [Urine:2950]  General- Well developed; no acute distress; looks good for age Neck- No JVD, no carotid bruits Lungs- clear lung fields; normal I:E ratio Cardiovascular- normal PMI; normal S1 and S2; irregular rhythm Abdomen- normal bowel sounds; soft and non-tender without masses or organomegaly Skin- Warm, no significant lesions Extremities- Nl distal pulses; no edema  BMET:  Basename 05/19/11 0506 05/18/11 0415  NA 138 141  K 2.6* 4.3  CL 105 109  CO2 25 24  GLUCOSE 99 124*  BUN 18 25*  CREATININE 0.77 0.89  CALCIUM 7.9* 8.9   Hepatic Function:  No results found for this basename: PROT,ALBUMIN,AST,ALT,ALKPHOS,BILITOT,BILIDIR,IBILI in the last 72 hours GFR:  Estimated Creatinine Clearance: 51.1 ml/min (by C-G formula based on Cr of 0.77). Lipids:  No results found for this basename: CHOL,TRIG,HDL,LDL in the last 72 hours  Imaging Studies/Results: No results found.  Imaging: Imaging results  have been reviewed.   Most recent chest x-ray was 4 days ago and demonstrated severe bibasilar pneumonia. Family requests a followup film.  Medications: I have reviewed the patient's current medications.  Infusions:     . sodium chloride 100 mL/hr at 05/19/11 1233   Atrial Fibrillation-appears to be chronic; heart rate is controlled; patient is not a candidate for long-term anticoagulation.  Pneumonia-clinically improved; repeat chest x-ray requested.  Hypotension-resolved; creatinine was normal on admission, but has further improved with IV hydration. IV fluids can be discontinued.  Marked hypokalemia: 30 mEq of intravenous and 40 mg of oral replacement ordered; he will need more, which will best be given orally.  Would discontinue Foley catheter and start serious physical therapy.  Minimally elevated cardiac markers on admission are insignificant. Echocardiogram shows normal left ventricular systolic function without segmental wall motion abnormalities to indicate prior MI. He does not appear to have congestive heart failure. He has no significant valvular disease. Cardiology will be available as needed during this hospitalization.   LOS: 6 days    Bing 05/19/2011, 1:16 PM

## 2011-05-20 LAB — CBC
Hemoglobin: 12.7 g/dL — ABNORMAL LOW (ref 13.0–17.0)
RBC: 3.82 MIL/uL — ABNORMAL LOW (ref 4.22–5.81)
WBC: 6.5 10*3/uL (ref 4.0–10.5)

## 2011-05-20 LAB — BASIC METABOLIC PANEL
GFR calc Af Amer: 81 mL/min — ABNORMAL LOW (ref >90)
GFR calc non Af Amer: 70 mL/min — ABNORMAL LOW (ref >90)
Potassium: 3 mEq/L — ABNORMAL LOW (ref 3.5–5.1)
Sodium: 139 mEq/L (ref 135–145)

## 2011-05-20 LAB — CULTURE, BLOOD (ROUTINE X 2): Culture: NO GROWTH

## 2011-05-20 MED ORDER — PREDNISONE 20 MG PO TABS
40.0000 mg | ORAL_TABLET | Freq: Every day | ORAL | Status: DC
  Administered 2011-05-21 – 2011-05-23 (×3): 40 mg via ORAL
  Filled 2011-05-20 (×3): qty 2

## 2011-05-20 MED ORDER — POTASSIUM CHLORIDE CRYS ER 20 MEQ PO TBCR
40.0000 meq | EXTENDED_RELEASE_TABLET | ORAL | Status: AC
  Administered 2011-05-21: 40 meq via ORAL
  Filled 2011-05-20 (×3): qty 2

## 2011-05-20 NOTE — Progress Notes (Signed)
ANTIBIOTIC CONSULT NOTE - follow up  Pharmacy Consult for Zosyn Indication: Pneumonia  No Known Allergies  Vital Signs: Temp: 97.9 F (36.6 C) (12/31 0511) Temp src: Axillary (12/31 0511) BP: 178/82 mmHg (12/31 0511) Pulse Rate: 87  (12/31 0511)  Labs:  Basename 05/20/11 0425 05/19/11 0506 05/18/11 0415  WBC 6.5 -- 10.5  HGB 12.7* -- 12.2*  PLT 108* -- 92*  LABCREA -- -- --  CREATININE 0.88 0.77 0.89   Estimated Creatinine Clearance: 46.4 ml/min (by C-G formula based on Cr of 0.88).  Medications:  Scheduled:     . aspirin EC  81 mg Oral Q M,W,F  . docusate sodium  100 mg Oral BID  . doxepin  25 mg Oral QHS  . guaiFENesin  600 mg Oral BID  . hydrocortisone sod succinate (SOLU-CORTEF) injection  25 mg Intravenous Q12H  . metoprolol tartrate  12.5 mg Oral BID  . oseltamivir  75 mg Oral BID  . piperacillin-tazobactam (ZOSYN)  IV  3.375 g Intravenous Q8H  . potassium chloride  30 mEq Oral Daily  . potassium chloride  10 mEq Intravenous Q1 Hr x 3  . potassium chloride  40 mEq Oral Once  . senna  1 tablet Oral BID  . DISCONTD: aspirin  81 mg Oral 3 times weekly   Assessment: 89YOM on day #6/x Zosyn for PNA/SIRS.  Remains AF. PCT 17.2 on 12/25 - not rechecked. Stress dose steroids started 12/26 -> reduced to 25mg  IV q12 (tapering) Renal fxn normalized.  MRSA PCR negative 12/25 Blcx x 3: still ngtd  12/26 Resp: NF  Patient appears clinically improved: no signs of SIRS and out of ICU for > 72 hrs, has been AF and WBC WNL.  Would consider switching antibiotics to PO and could d/c abx soon.  Plan:  Continue Zosyn 3.375g IV Q8H infused over 4hrs. Follow up length of treatment duration for antibiotics.  Clance Boll 05/20/2011,9:06 AM

## 2011-05-20 NOTE — Progress Notes (Signed)
Subjective: staes he feels better today, denies any new c/o. Daughter at bedside. Objective: Filed Vitals:   05/20/11 1450 05/20/11 1452 05/20/11 1500 05/20/11 2236  BP:   146/61 181/76  Pulse: 85  67 66  Temp:   97.6 F (36.4 C) 97.7 F (36.5 C)  TempSrc:   Oral Oral  Resp:   20 18  Height:      Weight:      SpO2: 81% 96% 93% 96%   Weight change:   Intake/Output Summary (Last 24 hours) at 05/20/11 2256 Last data filed at 05/20/11 2231  Gross per 24 hour  Intake    480 ml  Output   3001 ml  Net  -2521 ml    General: Alert, awake, oriented x3, in no acute distress.  HEENT: No bruits, no goiter.  Heart: Regular rate and rhythm, without murmurs, rubs, gallops.  Lungs: decreased rhonchi, mod. bilateral air movement, no wheezes  Abdomen: Soft, nontender, nondistended, positive bowel sounds.  Neuro: Grossly intact, nonfocal.   Lab Results:  Basename 05/20/11 0425 05/19/11 0506  NA 139 138  K 3.0* 2.6*  CL 103 105  CO2 31 25  GLUCOSE 88 99  BUN 16 18  CREATININE 0.88 0.77  CALCIUM 8.3* 7.9*  MG -- 1.8  PHOS -- --   No results found for this basename: AST:2,ALT:2,ALKPHOS:2,BILITOT:2,PROT:2,ALBUMIN:2 in the last 72 hours No results found for this basename: LIPASE:2,AMYLASE:2 in the last 72 hours  Basename 05/20/11 0425 05/18/11 0415  WBC 6.5 10.5  NEUTROABS -- --  HGB 12.7* 12.2*  HCT 36.3* 35.3*  MCV 95.0 95.9  PLT 108* 92*   No results found for this basename: CKTOTAL:3,CKMB:3,CKMBINDEX:3,TROPONINI:3 in the last 72 hours No components found with this basename: POCBNP:3 No results found for this basename: DDIMER:2 in the last 72 hours No results found for this basename: HGBA1C:2 in the last 72 hours No results found for this basename: CHOL:2,HDL:2,LDLCALC:2,TRIG:2,CHOLHDL:2,LDLDIRECT:2 in the last 72 hours No results found for this basename: TSH,T4TOTAL,FREET3,T3FREE,THYROIDAB in the last 72 hours No results found for this basename:  VITAMINB12:2,FOLATE:2,FERRITIN:2,TIBC:2,IRON:2,RETICCTPCT:2 in the last 72 hours  Micro Results: Recent Results (from the past 240 hour(s))  CULTURE, BLOOD (SINGLE)     Status: Normal (Preliminary result)   Collection Time   05/14/11  4:53 AM      Component Value Range Status Comment   Specimen Description BLOOD RIGHT ARM   Final    Special Requests BOTTLES DRAWN AEROBIC AND ANAEROBIC 5CC   Final    Setup Time [id number]   Final    Culture     Final    Value:        BLOOD CULTURE RECEIVED NO GROWTH TO DATE CULTURE WILL BE HELD FOR 5 DAYS BEFORE ISSUING A FINAL NEGATIVE REPORT   Report Status PENDING   Incomplete   CULTURE, BLOOD (ROUTINE X 2)     Status: Normal   Collection Time   05/14/11  8:34 AM      Component Value Range Status Comment   Specimen Description     Final    Value: BLOOD BOTTLES DRAWN AEROBIC AND ANAEROBIC LEFT HAND   Special Requests NONE [facility] EACH   Final    Setup Time [id number]   Final    Culture NO GROWTH 5 DAYS   Final    Report Status 05/20/2011 FINAL   Final   CULTURE, BLOOD (ROUTINE X 2)     Status: Normal   Collection Time   05/14/11  9:13 AM      Component Value Range Status Comment   Specimen Description     Final    Value: BLOOD BOTTLES DRAWN AEROBIC AND ANAEROBIC RIGHT ARM   Special Requests NONE [facility] EACH   Final    Setup Time [id number]   Final    Culture NO GROWTH 5 DAYS   Final    Report Status 05/20/2011 FINAL   Final   MRSA PCR SCREENING     Status: Normal   Collection Time   05/14/11  4:20 PM      Component Value Range Status Comment   MRSA by PCR NEGATIVE  NEGATIVE  Final   CULTURE, SPUTUM-ASSESSMENT     Status: Normal   Collection Time   05/15/11  6:11 AM      Component Value Range Status Comment   Specimen Description SPUTUM   Final    Special Requests NONE   Final    Sputum evaluation     Final    Value: THIS SPECIMEN IS ACCEPTABLE. RESPIRATORY CULTURE REPORT TO FOLLOW.   Report Status 05/15/2011 FINAL   Final   CULTURE,  RESPIRATORY     Status: Normal   Collection Time   05/15/11  6:11 AM      Component Value Range Status Comment   Specimen Description SPUTUM   Final    Special Requests NONE   Final    Gram Stain     Final    Value: FEW WBC PRESENT, PREDOMINANTLY PMN     RARE SQUAMOUS EPITHELIAL CELLS PRESENT     RARE GRAM POSITIVE COCCI     IN PAIRS   Culture NORMAL OROPHARYNGEAL FLORA   Final    Report Status 05/17/2011 FINAL   Final     Studies/Results: Dg Chest 2 View  05/19/2011  *RADIOLOGY REPORT*  Clinical Data: Pneumonia.  Shortness of breath, cough, and chest congestion.  CHEST - 2 VIEW  Comparison: 12/26 and 05/14/2011  Findings: The patient has now developed a moderate left pleural effusion and a small right effusion.  The infiltrates have improved bilaterally.  Pulmonary vascularity is normal.  No acute osseous abnormality.  Old wedge deformity of T11.  IMPRESSION:  1.  The patient now has moderate left and small right pleural effusions. 2.  Improving bibasilar pneumonia.  Original Report Authenticated By: Gwynn Burly, M.D.    Medications: I have reviewed the patient's current medications.   Patient Active Hospital Problem List: Encephalopathy (05/15/2011) Resolved on current abx regimen.  Likely related to pneumonia.      Atrial fibrillation (10/24/2010) Pt NSR, conitnue metoprolol   Benign hypertensive heart disease without heart failure (10/24/2010)  Chronic ischemic heart disease/NSTEMI (non-ST elevated myocardial infarction) (05/14/2011) -Stable no chest pain reported, coninue outpt meds.     Community acquired pneumonia (05/14/2011) -improving  on current abx regimen, remaining afebrile off vanco   -leukocytosis resolved, - BP stable on tapering steroids doses, changed to PO steroids, follow. PT OT following.  Influenza A (05/15/2011) - cont tamiflu  Thrombocytopenia (05/15/2011)  no gross bleeding, recheck in am.  Hypokalemia (05/16/2011) Replace k, mg wnl      LOS: 7 days   Kela Millin M.D.  [facility] 05/20/2011, 10:56 PM

## 2011-05-20 NOTE — Plan of Care (Signed)
Problem: Phase II Progression Outcomes Goal: Progress activity as tolerated unless otherwise ordered Outcome: Progressing Pt making progress with activity this tx, able to ambulate 40'. [name], [place] [phone]  05/20/2011 15:51

## 2011-05-20 NOTE — Progress Notes (Signed)
Speech Language/Pathology Speech Pathology: Dysphagia Treatment Note  Patient was observed with : Mechanical Soft / Ground and Thin liquids.  Patient was noted to have s/s of aspiration : Yes  Lung Sounds:  Not recorded yet Temperature:  97.9  Patient required:  Mild-mod cues to consistently follow precautions/strategies  Clinical Impression:  Pt. observed with graham cracker and water via cup.  Several throat clears after water inditative of possible decreased airway protection.  Laryngeal elevation appeared decreased on observation. Masticated cracker in a timely manner without oral residue.  Pt. continues to be at somewhat increased aspiration risk due to deconditioning with current illness.   Recommendations:  Continue Dys 3 thin liquids   Pain:   none Intervention Required:   No   Goals: No Goals Met  Royce Macadamia M.Ed [employer] [phone]  05/20/2011

## 2011-05-20 NOTE — Progress Notes (Signed)
Physical Therapy Treatment Patient Details Name: [name] MRN: [id number] DOB: 01-08-1912 Today's Date: 05/20/2011  PT Assessment/Plan  PT - Assessment/Plan Comments on Treatment Session: Pt tolerated significant increase in activity, but experienced great SOB with minimal tasks. Pts O2 dropped to 81% on RA with bed mobility, bur retruned to 96% within on 3L. P tplans for SNF at DC PT Plan: Discharge plan remains appropriate;Frequency remains appropriate PT Frequency: Min 3X/week Follow Up Recommendations: Skilled nursing facility Equipment Recommended: Defer to next venue PT Goals  Acute Rehab PT Goals PT Goal: Supine/Side to Sit - Progress: Progressing toward goal PT Goal: Sit to Supine/Side - Progress: Other (comment) (Not addressed) PT Goal: Stand to Sit - Progress: Progressing toward goal PT Transfer Goal: Bed to Chair/Chair to Bed - Progress: Progressing toward goal PT Goal: Ambulate - Progress: Progressing toward goal  PT Treatment Precautions/Restrictions  Precautions Precautions: Fall Restrictions Weight Bearing Restrictions: No Mobility (including Balance) Bed Mobility Bed Mobility: Yes Supine to Sit: 1: +1 Total assist Supine to Sit Details (indicate cue type and reason): Pt 75% with cues for initiation and hand placement Transfers Transfers: Yes Sit to Stand: 1: +2 Total assist;From bed;With upper extremity assist (Pt 60%) Sit to Stand Details (indicate cue type and reason): Cues for initiation and hand placement. Pt wanted to stand for 2 min before ambulating, Min A for steadying Stand to Sit: 3: Mod assist;With upper extremity assist;To chair/3-in-1 Stand to Sit Details: Cues for hand placement Ambulation/Gait Ambulation/Gait: Yes Ambulation/Gait Assistance: 3: Mod assist Ambulation/Gait Assistance Details (indicate cue type and reason): A for steadying, cues for foot clearance, posture, and RW proximity to self Ambulation Distance (Feet): 40  Feet Assistive device: Rolling walker Gait Pattern: Decreased stride length;Shuffle;Trunk flexed Stairs: No Wheelchair Mobility Wheelchair Mobility: No  Posture/Postural Control Posture/Postural Control:  (Forward flexed ) Balance Balance Assessed: No Exercise  General Exercises - Lower Extremity Ankle Circles/Pumps: Seated;20 reps;Both;Strengthening;AROM Long Arc Quad: Seated;20 reps;Both;Strengthening;AROM Hip Flexion/Marching: Standing;10 reps;Both;Strengthening;AROM Toe Raises: Standing;10 reps;Both;Strengthening;AROM Mini-Sqauts: Standing;10 reps;Both;Strengthening;AROM End of Session PT - End of Session Equipment Utilized During Treatment: Gait belt Activity Tolerance: Patient limited by fatigue Patient left: in chair;with call bell in reach;with family/visitor present Nurse Communication: Mobility status for transfers;Mobility status for ambulation General Behavior During Session: [facility] for tasks performed Cognition: [facility] for tasks performed  [facility] [place], [place] [phone] 05/20/2011, 3:50 PM

## 2011-05-21 DIAGNOSIS — Z8673 Personal history of transient ischemic attack (TIA), and cerebral infarction without residual deficits: Secondary | ICD-10-CM

## 2011-05-21 DIAGNOSIS — I2510 Atherosclerotic heart disease of native coronary artery without angina pectoris: Secondary | ICD-10-CM

## 2011-05-21 DIAGNOSIS — J189 Pneumonia, unspecified organism: Secondary | ICD-10-CM

## 2011-05-21 DIAGNOSIS — K5900 Constipation, unspecified: Secondary | ICD-10-CM

## 2011-05-21 HISTORY — DX: Atherosclerotic heart disease of native coronary artery without angina pectoris: I25.10

## 2011-05-21 HISTORY — DX: Pneumonia, unspecified organism: J18.9

## 2011-05-21 HISTORY — DX: Personal history of transient ischemic attack (TIA), and cerebral infarction without residual deficits: Z86.73

## 2011-05-21 HISTORY — DX: Constipation, unspecified: K59.00

## 2011-05-21 LAB — BASIC METABOLIC PANEL
BUN: 17 mg/dL (ref 6–23)
CO2: 34 mEq/L — ABNORMAL HIGH (ref 19–32)
Calcium: 8.2 mg/dL — ABNORMAL LOW (ref 8.4–10.5)
Chloride: 99 mEq/L (ref 96–112)
Creatinine, Ser: 0.91 mg/dL (ref 0.50–1.35)
Glucose, Bld: 112 mg/dL — ABNORMAL HIGH (ref 70–99)

## 2011-05-21 LAB — GLUCOSE, CAPILLARY: Glucose-Capillary: 93 mg/dL (ref 70–99)

## 2011-05-21 LAB — CULTURE, BLOOD (SINGLE)

## 2011-05-21 MED ORDER — POTASSIUM CHLORIDE CRYS ER 20 MEQ PO TBCR
40.0000 meq | EXTENDED_RELEASE_TABLET | ORAL | Status: AC
  Administered 2011-05-21 (×2): 40 meq via ORAL
  Filled 2011-05-21 (×3): qty 2

## 2011-05-21 NOTE — Progress Notes (Signed)
Subjective:  Some difficulty expectorating this am, denies sob, no cp Objective: Filed Vitals:   05/20/11 1452 05/20/11 1500 05/20/11 2236 05/21/11 0357  BP:  146/61 181/76 160/75  Pulse:  67 66 65  Temp:  97.6 F (36.4 C) 97.7 F (36.5 C) 98 F (36.7 C)  TempSrc:  Oral Oral Oral  Resp:  20 18 16   Height:      Weight:      SpO2: 96% 93% 96% 96%   Weight change:   Intake/Output Summary (Last 24 hours) at 05/21/11 1112 Last data filed at 05/21/11 0300  Gross per 24 hour  Intake    720 ml  Output   2500 ml  Net  -1780 ml    General: Alert, awake, oriented x3, in no acute distress.  HEENT: No bruits, no goiter.  Heart: Regular rate and rhythm, without murmurs, rubs, gallops.  Lungs: clear, no crackles mod. bilateral air movement, no wheezes  Abdomen: Soft, nontender, nondistended, positive bowel sounds.  Neuro: Grossly intact, nonfocal.   Lab Results:  Basename 05/21/11 0500 05/20/11 0425 05/19/11 0506  NA 137 139 --  K 2.9* 3.0* --  CL 99 103 --  CO2 34* 31 --  GLUCOSE 112* 88 --  BUN 17 16 --  CREATININE 0.91 0.88 --  CALCIUM 8.2* 8.3* --  MG -- -- 1.8  PHOS -- -- --   No results found for this basename: AST:2,ALT:2,ALKPHOS:2,BILITOT:2,PROT:2,ALBUMIN:2 in the last 72 hours No results found for this basename: LIPASE:2,AMYLASE:2 in the last 72 hours  Basename 05/20/11 0425  WBC 6.5  NEUTROABS --  HGB 12.7*  HCT 36.3*  MCV 95.0  PLT 108*   No results found for this basename: CKTOTAL:3,CKMB:3,CKMBINDEX:3,TROPONINI:3 in the last 72 hours No components found with this basename: POCBNP:3 No results found for this basename: DDIMER:2 in the last 72 hours No results found for this basename: HGBA1C:2 in the last 72 hours No results found for this basename: CHOL:2,HDL:2,LDLCALC:2,TRIG:2,CHOLHDL:2,LDLDIRECT:2 in the last 72 hours No results found for this basename: TSH,T4TOTAL,FREET3,T3FREE,THYROIDAB in the last 72 hours No results found for this basename:  VITAMINB12:2,FOLATE:2,FERRITIN:2,TIBC:2,IRON:2,RETICCTPCT:2 in the last 72 hours  Micro Results: Recent Results (from the past 240 hour(s))  CULTURE, BLOOD (SINGLE)     Status: Normal   Collection Time   05/14/11  4:53 AM      Component Value Range Status Comment   Specimen Description BLOOD RIGHT ARM   Final    Special Requests BOTTLES DRAWN AEROBIC AND ANAEROBIC 5CC   Final    Setup Time [id number]   Final    Culture NO GROWTH 5 DAYS   Final    Report Status 05/21/2011 FINAL   Final   CULTURE, BLOOD (ROUTINE X 2)     Status: Normal   Collection Time   05/14/11  8:34 AM      Component Value Range Status Comment   Specimen Description     Final    Value: BLOOD BOTTLES DRAWN AEROBIC AND ANAEROBIC LEFT HAND   Special Requests NONE [facility] EACH   Final    Setup Time [id number]   Final    Culture NO GROWTH 5 DAYS   Final    Report Status 05/20/2011 FINAL   Final   CULTURE, BLOOD (ROUTINE X 2)     Status: Normal   Collection Time   05/14/11  9:13 AM      Component Value Range Status Comment   Specimen Description     Final  Value: BLOOD BOTTLES DRAWN AEROBIC AND ANAEROBIC RIGHT ARM   Special Requests NONE [facility] EACH   Final    Setup Time [id number]   Final    Culture NO GROWTH 5 DAYS   Final    Report Status 05/20/2011 FINAL   Final   MRSA PCR SCREENING     Status: Normal   Collection Time   05/14/11  4:20 PM      Component Value Range Status Comment   MRSA by PCR NEGATIVE  NEGATIVE  Final   CULTURE, SPUTUM-ASSESSMENT     Status: Normal   Collection Time   05/15/11  6:11 AM      Component Value Range Status Comment   Specimen Description SPUTUM   Final    Special Requests NONE   Final    Sputum evaluation     Final    Value: THIS SPECIMEN IS ACCEPTABLE. RESPIRATORY CULTURE REPORT TO FOLLOW.   Report Status 05/15/2011 FINAL   Final   CULTURE, RESPIRATORY     Status: Normal   Collection Time   05/15/11  6:11 AM      Component Value Range Status Comment   Specimen  Description SPUTUM   Final    Special Requests NONE   Final    Gram Stain     Final    Value: FEW WBC PRESENT, PREDOMINANTLY PMN     RARE SQUAMOUS EPITHELIAL CELLS PRESENT     RARE GRAM POSITIVE COCCI     IN PAIRS   Culture NORMAL OROPHARYNGEAL FLORA   Final    Report Status 05/17/2011 FINAL   Final     Studies/Results: Dg Chest 2 View  05/19/2011  *RADIOLOGY REPORT*  Clinical Data: Pneumonia.  Shortness of breath, cough, and chest congestion.  CHEST - 2 VIEW  Comparison: 12/26 and 05/14/2011  Findings: The patient has now developed a moderate left pleural effusion and a small right effusion.  The infiltrates have improved bilaterally.  Pulmonary vascularity is normal.  No acute osseous abnormality.  Old wedge deformity of T11.  IMPRESSION:  1.  The patient now has moderate left and small right pleural effusions. 2.  Improving bibasilar pneumonia.  Original Report Authenticated By: Gwynn Burly, M.D.    Medications: I have reviewed the patient's current medications.   Patient Active Hospital Problem List: Encephalopathy (05/15/2011) Resolved, Likely delirium secondary  to pneumonia.      Atrial fibrillation (10/24/2010) Pt NSR, continue metoprolol   Benign hypertensive heart disease without heart failure (10/24/2010)  Chronic ischemic heart disease/NSTEMI (non-ST elevated myocardial infarction) (05/14/2011) -Stable no chest pain reported, coninue outpt meds.     Community acquired pneumonia (05/14/2011) -improved, will change to   Oral avelox begining in am -leukocytosis resolved, - BP stable on tapering steroids doses, changed to PO steroids and continue tapering off.  -continue mucolytics and add fluttter valve  PT OT following.  Influenza A (05/15/2011) - cont tamiflu  Thrombocytopenia (05/15/2011)  no gross bleeding, recheck in am.  Hypokalemia (05/16/2011) Replace k, mg wnl   SW  To assist with transitioning to [facility]  LOS: 8 days   Kela Millin  M.D.  [facility] 05/21/2011, 11:12 AM

## 2011-05-22 LAB — BASIC METABOLIC PANEL
BUN: 14 mg/dL (ref 6–23)
CO2: 33 mEq/L — ABNORMAL HIGH (ref 19–32)
Calcium: 8.5 mg/dL (ref 8.4–10.5)
Chloride: 98 mEq/L (ref 96–112)
Creatinine, Ser: 0.88 mg/dL (ref 0.50–1.35)
Glucose, Bld: 109 mg/dL — ABNORMAL HIGH (ref 70–99)

## 2011-05-22 LAB — GLUCOSE, CAPILLARY: Glucose-Capillary: 94 mg/dL (ref 70–99)

## 2011-05-22 MED ORDER — AMLODIPINE BESYLATE 10 MG PO TABS
10.0000 mg | ORAL_TABLET | Freq: Every day | ORAL | Status: DC
  Administered 2011-05-22 – 2011-05-23 (×2): 10 mg via ORAL
  Filled 2011-05-22 (×2): qty 1

## 2011-05-22 MED ORDER — CLONIDINE HCL 0.1 MG PO TABS
0.1000 mg | ORAL_TABLET | Freq: Two times a day (BID) | ORAL | Status: DC | PRN
  Filled 2011-05-22: qty 1

## 2011-05-22 MED ORDER — LEVOFLOXACIN 500 MG PO TABS
500.0000 mg | ORAL_TABLET | ORAL | Status: DC
  Administered 2011-05-22 – 2011-05-23 (×2): 500 mg via ORAL
  Filled 2011-05-22 (×2): qty 1

## 2011-05-22 NOTE — Progress Notes (Signed)
  PCP: Cassell Clement, MD, MD  Brief HPI: This is a 78 year old, Caucasian male, with a history of atrial fibrillation, recurrent GI bleeding, hypertension, remote MI, who presented to the hospital with confusion. He's been having several days of cough and sore throat. He also had fever. He was found to have pneumonia on the chest x-ray and influenza screen was also positive. Patient was started on antibiotics and he is significantly improved.  Past medical history: Atrial fibrillation, Not on Coumadin due to recurrent, severe GIB, H/O: GI bleed, Inguinal hernia, Spinal stenosis S/p surgery, MI, old 1976, Diverticulitis, BPH (benign prostatic hyperplasia)   Consultants: [facility] cardiology has signed off.  Procedures: None  Subjective: Patient feels much better. Still has a cough. Denies any chest pains. Appetite is better. And he did have a bowel movement today.  Objective: Vital signs in last 24 hours: Temp:  [97.3 F (36.3 C)-98.3 F (36.8 C)] 97.4 F (36.3 C) (01/02 0432) Pulse Rate:  [74-81] 81  (01/02 0432) Resp:  [17-19] 17  (01/02 0432) BP: (171-203)/(59-99) 185/96 mmHg (01/02 0432) SpO2:  [95 %-97 %] 95 % (01/02 0432) Weight change:  Last BM Date: 05/22/11  Intake/Output from previous day: 01/01 0701 - 01/02 0700 In: 1270 [P.O.:1120; IV Piggyback:150] Out: 4350 [Urine:4350] Intake/Output this shift: Total I/O In: 240 [P.O.:240] Out: 1 [Stool:1]  General appearance: alert, cooperative, appears stated age and no distress Head: Normocephalic, without obvious abnormality, atraumatic Throat: lips, mucosa, and tongue normal; teeth and gums normal Neck: no adenopathy, no carotid bruit, no JVD, supple, symmetrical, trachea midline and thyroid not enlarged, symmetric, no tenderness/mass/nodules Resp: few crackles at bases, mostly clear to auscultation Cardio: irregular rhythm, normal rate. No murmurs appreciated. Extremities: extremities normal, atraumatic, no cyanosis or  edema Pulses: 2+ and symmetric Skin: Skin color, texture, turgor normal. No rashes or lesions Lymph nodes: Cervical, supraclavicular, and axillary nodes normal. Neurologic: Grossly normal  Lab Results:  Basename 05/20/11 0425  WBC 6.5  HGB 12.7*  HCT 36.3*  PLT 108*   BMET  Basename 05/22/11 0450 05/21/11 0500  NA 137 137  K 4.0 2.9*  CL 98 99  CO2 33* 34*  GLUCOSE 109* 112*  BUN 14 17  CREATININE 0.88 0.91  CALCIUM 8.5 8.2*    Studies/Results: No results found.  Medications: I have reviewed the patient's current medications.  Assessment/Plan:  Principal Problem:  *Encephalopathy Active Problems:  Campath-induced atrial fibrillation  Benign hypertensive heart disease without heart failure  Chronic ischemic heart disease  Community acquired pneumonia  NSTEMI (non-ST elevated myocardial infarction)  Influenza A  Thrombocytopenia  Hypokalemia    1. Acute Encephalopathy: Resolved. Likely delirium secondary to pneumonia.   2. CAP: Change to Po abx. Continue mucolytics and add fluttter valve. Check RA saturations. May need home O2.  3. Influenza: tamiflu  4. Atrial fibrillation: Continue metoprolol. Not a candidate for anticoagulation per cards.   5. HTN: Start amlodipine.  6. Chronic ischemic heart disease/NSTEMI (non-ST elevated myocardial infarction): Stable no chest pain reported, continue outpt meds.   7. PT OT following.   8. Thrombocytopenia: Improving. Likely due to sepsis.  9. Hypokalemia: resolved   SW To assist with transitioning to [facility]. Possible discharge in AM.    LOS: 9 days   [facility] Pager [phone] 05/22/2011, 12:05 PM

## 2011-05-22 NOTE — Progress Notes (Signed)
Physical Therapy Treatment Patient Details Name: Mendel Binsfeld MRN: [id number] DOB: 03-31-1912 Today's Date: 05/22/2011  Influenza A 10:20 - 10:55 1 gt  1 ta PT Assessment/Plan  PT - Assessment/Plan Comments on Treatment Session: vitals prior: 146/70 (89), 2 lts O2 88%, HR 70     vitals during: 163/71(92), 2 lts O2 93%, HR 84  Pt plans to return to [facility] PT Plan: Discharge plan remains appropriate Follow Up Recommendations: Skilled nursing facility Equipment Recommended: Defer to next venue PT Goals  Acute Rehab PT Goals PT Goal Formulation: With patient Pt will go Supine/Side to Sit: with supervision PT Goal: Supine/Side to Sit - Progress: Progressing toward goal Pt will go Sit to Supine/Side: with supervision PT Goal: Sit to Supine/Side - Progress: Progressing toward goal Pt will go Stand to Sit: with supervision PT Goal: Stand to Sit - Progress: Progressing toward goal Pt will Transfer Bed to Chair/Chair to Bed: with min assist PT Transfer Goal: Bed to Chair/Chair to Bed - Progress: Progressing toward goal Pt will Ambulate: 51 - 150 feet;with min assist;with least restrictive assistive device PT Goal: Ambulate - Progress: Progressing toward goal  PT Treatment Precautions/Restrictions  Precautions Precautions: Fall Restrictions Weight Bearing Restrictions: No Mobility (including Balance) Bed Mobility Bed Mobility: Yes Supine to Sit: 4: Min assist Supine to Sit Details (indicate cue type and reason): increased time and HOB elevated 45' Transfers Transfers: Yes Sit to Stand: 1: +2 Total assist Sit to Stand Details (indicate cue type and reason): total assist +2 pt 75% (+2 assist for equipment/IV/O2/chair) Stand to Sit: 4: Min assist Stand to Sit Details: one VC on hand placement and increased time Ambulation/Gait Ambulation/Gait: Yes Ambulation/Gait Assistance: 1: +2 Total assist Ambulation/Gait Assistance Details (indicate cue type and reason): Total  assist + 2 pt 75% demon mild unsteadyness with limited act tolerance (+2 assist for equipment/IV/O2/chair) Ambulation Distance (Feet): 80 Feet Assistive device: Rolling walker Gait Pattern: Step-through pattern;Shuffle;Trunk flexed (X 3 standing rest breaks with mild SOB) Stairs: No Wheelchair Mobility Wheelchair Mobility: No    Exercise    End of Session PT - End of Session Equipment Utilized During Treatment: Gait belt Activity Tolerance: Patient limited by fatigue Patient left: in chair;with call bell in reach;with family/visitor present Nurse Communication: Mobility status for ambulation General Behavior During Session: [facility] for tasks performed Cognition: [facility] for tasks performed  Felecia Shelling PTA [facility]  Acute  Rehab Pager     [phone]

## 2011-05-22 NOTE — Progress Notes (Signed)
On call np notified of patient b/p of 185/96. New orders received, she would put in med orders.

## 2011-05-23 MED ORDER — LEVALBUTEROL TARTRATE 45 MCG/ACT IN AERO: 1.0000 | INHALATION_SPRAY | Freq: Four times a day (QID) | RESPIRATORY_TRACT | Status: DC | PRN

## 2011-05-23 MED ORDER — GUAIFENESIN ER 600 MG PO TB12: 600.0000 mg | ORAL_TABLET | Freq: Two times a day (BID) | ORAL | Status: DC

## 2011-05-23 MED ORDER — ACETAMINOPHEN 325 MG PO TABS: 650.0000 mg | ORAL_TABLET | Freq: Four times a day (QID) | ORAL | Status: AC | PRN

## 2011-05-23 MED ORDER — OSELTAMIVIR PHOSPHATE 75 MG PO CAPS: 75.0000 mg | ORAL_CAPSULE | Freq: Two times a day (BID) | ORAL | Status: AC

## 2011-05-23 MED ORDER — METOPROLOL TARTRATE 25 MG PO TABS: 25.0000 mg | ORAL_TABLET | Freq: Two times a day (BID) | ORAL | Status: DC

## 2011-05-23 MED ORDER — AMLODIPINE BESYLATE 10 MG PO TABS: 10.0000 mg | ORAL_TABLET | Freq: Every day | ORAL | Status: DC

## 2011-05-23 MED ORDER — METOPROLOL TARTRATE 25 MG PO TABS
25.0000 mg | ORAL_TABLET | Freq: Two times a day (BID) | ORAL | Status: DC
  Filled 2011-05-23: qty 1

## 2011-05-23 MED ORDER — LEVOFLOXACIN 500 MG PO TABS: 500.0000 mg | ORAL_TABLET | ORAL | Status: AC

## 2011-05-23 MED ORDER — PREDNISONE 10 MG PO TABS: ORAL_TABLET | ORAL | Status: DC

## 2011-05-23 MED ORDER — DSS 100 MG PO CAPS: 100.0000 mg | ORAL_CAPSULE | Freq: Two times a day (BID) | ORAL | Status: AC

## 2011-05-23 NOTE — Progress Notes (Signed)
Physical Therapy Treatment Patient Details Name: [name] MRN: [id number] DOB: 1911-07-22 Today's Date: 05/23/2011 Time: 6295-2841  1G, 1 IPC PT Assessment/Plan  PT - Assessment/Plan Comments on Treatment Session: Pt is continuing to progress well. Continue to recommend ST rehab at [facility].  PT Plan: Discharge plan remains appropriate Follow Up Recommendations: Skilled nursing facility Equipment Recommended: Defer to next venue PT Goals  Acute Rehab PT Goals PT Goal: Stand to Sit - Progress: Progressing toward goal PT Goal: Ambulate - Progress: Progressing toward goal  PT Treatment Precautions/Restrictions  Precautions Precautions: Fall Restrictions Weight Bearing Restrictions: No Mobility (including Balance) Bed Mobility Bed Mobility: No Transfers Transfers: Yes Sit to Stand: 4: Min assist;From chair/3-in-1;With armrests;With upper extremity assist Sit to Stand Details (indicate cue type and reason): VCs safety, technique, hand placement. Assist to rise, stabilize. Stand to Sit: 4: Min assist;To chair/3-in-1;With upper extremity assist;With armrests Stand to Sit Details: VCs safety, technique, hand placement. Assist to control descent.  Ambulation/Gait Ambulation/Gait: Yes Ambulation/Gait Assistance: 4: Min assist Ambulation/Gait Assistance Details (indicate cue type and reason): Vcs safety, posture, for pt to increase step length and slow pace slightly. Followed with recliner but not absolutely necessary. Pt stops intermittently ( x 3) during gait.  Ambulation Distance (Feet): 95 Feet Assistive device: Rolling walker Gait Pattern: Step-through pattern;Decreased step length - right;Decreased step length - left;Decreased stride length;Shuffle;Trunk flexed    Exercise    End of Session PT - End of Session Equipment Utilized During Treatment: Gait belt Activity Tolerance: Patient tolerated treatment well Patient left: in chair;with call bell in reach;with  family/visitor present General Behavior During Session: [facility] for tasks performed Cognition: [facility] for tasks performed  [name] Alert [facility] 05/23/2011, 12:01 PM [phone]

## 2011-05-23 NOTE — Discharge Summary (Signed)
Physician Discharge Summary  Patient ID: [name] MRN: [id number] DOB/AGE: 01-24-1912 78 y.o.  Admit date: 05/13/2011 Discharge date: 05/23/2011  Discharge Diagnoses:  Principal Problem:  *Encephalopathy Active Problems:  [place]ath-induced atrial fibrillation  Benign hypertensive heart disease without heart failure  Chronic ischemic heart disease  Community acquired pneumonia  NSTEMI (non-ST elevated myocardial infarction)  Influenza A  Thrombocytopenia  Hypokalemia  Discharged Condition: fair  Initial History: This is a 78 year old, Caucasian male, with a history of atrial fibrillation, recurrent GI bleeding, hypertension, remote MI, who presented to the hospital with confusion. He's been having several days of cough and sore throat. He also had fever. He was found to have pneumonia on the chest x-ray and influenza screen was also positive. Patient was started on antibiotics and he is significantly improved.   Past medical history: Atrial fibrillation, Not on Coumadin due to recurrent, severe GIB, H/O: GI bleed, Inguinal hernia, Spinal stenosis S/p surgery, MI, old 1976, Diverticulitis, BPH (benign prostatic hyperplasia)   Consultants: [facility] cardiology signed off.  Hospital Course:   1. Acute Encephalopathy: This was thought to be secondary to his pneumonia and influenza. As the infection is resolved confusion also improved.   2. CAP: Patient was initially started on intravenous antibiotics. This was changed over to oral antibiotics. Mucolytic and flutter valve were also utilized. At this time he will require continuous oxygenation. This may be reevaluated in 2 weeks time. He will also be discharged on a few more days of oral antibiotics along with inhalers. Incentive spirometry may also be useful in this patient.   3. Influenza: Tamiflu was initiated and the patient did respond.    4. Atrial fibrillation: Continue metoprolol. Not a candidate for anticoagulation per  cards. His heart rate remained stable.  5. HTN: Blood pressure remained poorly controlled during this hospitalization. We had to increase the dose of his amlodipine as well as his metoprolol. Please monitor his blood pressures closely at the skilled nursing facility and evaluate for new medications to control the blood pressure.   6. Chronic ischemic heart disease/NSTEMI (non-ST elevated myocardial infarction): Patient also had mildly elevated. Cardiac enzymes. These were thought to be secondary to demand ischemia and from his infection. Cardiology did see the patient and they obtained an echocardiogram, which showed no wall motion abnormalities. They did not feel that the patient required  further evaluation.   7. physical therapy and occupational therapy has been following the patient. They feel that the patient requires skilled nursing facility for short-term rehabilitation.  8. Thrombocytopenia: Is Improving and was likely due to sepsis.   9. Hypokalemia: Has resolved  Patient is being discharged to a skilled nursing facility for short-term rehabilitation.  PERTINENT LABS  His white blood cell the time of admission was 12.3. Influenza A was positive by PCR. Blood cultures were negative. His troponin was as high as 0.74. His white blood cell count did go up to 21.9 after he was initiated on steroids. His sodium level was as low as 131 and is now up to normal.  IMAGING STUDIES Dg Chest 2 View  05/19/2011  *RADIOLOGY REPORT*  Clinical Data: Pneumonia.  Shortness of breath, cough, and chest congestion.  CHEST - 2 VIEW  Comparison: 12/26 and 05/14/2011  Findings: The patient has now developed a moderate left pleural effusion and a small right effusion.  The infiltrates have improved bilaterally.  Pulmonary vascularity is normal.  No acute osseous abnormality.  Old wedge deformity of T11.  IMPRESSION:  1.  The patient now has moderate left and small right pleural effusions. 2.  Improving bibasilar  pneumonia.  Original Report Authenticated By: Gwynn Burly, M.D.   Dg Chest 2 View  05/14/2011  *RADIOLOGY REPORT*  Clinical Data: Fever.  Cough.  Pneumonia.  CHEST - 2 VIEW  Comparison: 09/13/2009.  Findings: Multi focal airspace disease is present in the right perihilar region, right base and left lower lobe.  This is suspicious for multifocal pneumonia.  Chronic lower thoracic compression fractures present.  Small left pleural effusion is probably parapneumonic.  Cardiopericardial silhouette and mediastinal contours appear within normal limits.  Asymmetric right pleural apical scarring. Radiographic follow-up is recommended to ensure clearing and exclude an underlying lesion.  Radiographic clearing is usually observed at 4-6 weeks.  On the lateral view, right basilar airspace disease appears prominent in the right middle lobe.  IMPRESSION: Multi focal left greater than right air space disease with dense consolidation of the left lower lobe, most compatible with multifocal pneumonia.  Asymmetric/atypical pulmonary edema and aspiration considered less likely.  Original Report Authenticated By: Andreas Newport, M.D.   Ct Head Wo Contrast  05/14/2011  *RADIOLOGY REPORT*  Clinical Data: Confusion, disoriented, fever  CT HEAD WITHOUT CONTRAST  Technique:  Contiguous axial images were obtained from the base of the skull through the vertex without contrast.  Comparison: 08/31/2007  Findings: Diffuse brain atrophy and chronic microvascular ischemic changes in the periventricular and subcortical white matter of both cerebral hemispheres.  No significant interval change.  No acute intracranial hemorrhage, definite acute infarction, focal edema, mass lesion, midline shift, herniation, hydrocephalus, or extra- axial fluid collection.  Cisterns patent.  No orbital abnormality. Mastoids and sinuses clear.  IMPRESSION: Stable atrophy and microvascular ischemic changes.  No acute process by noncontrast CT.  Original  Report Authenticated By: Judie Petit. Ruel Favors, M.D.   Dg Chest Port 1 View  05/15/2011  *RADIOLOGY REPORT*  Clinical Data: Shortness of breath, weakness  PORTABLE CHEST - 1 VIEW  Comparison: Chest x-ray of 05/14/2011  Findings: There has been significant worsening of opacities at both lung bases most consistent with pneumonia and probable effusions. Mild superimposed edema cannot be excluded.  The heart is mildly enlarged.  No bony abnormality is seen.  IMPRESSION: Probable bilateral lower lobe pneumonia with effusions.  Cannot exclude superimposed edema.  Original Report Authenticated By: Juline Patch, M.D.    Discharge Exam: Blood pressure 183/91, pulse 70, temperature 97.2 F (36.2 C), temperature source Oral, resp. rate 19, height 5\' 8"  (1.727 m), weight 73 kg (160 lb 15 oz), SpO2 93.00%. General appearance: alert, cooperative, appears stated age and no distress Head: Normocephalic, without obvious abnormality, atraumatic Resp: few crackles at bases but mostly clear. Cardio: regular rate and rhythm, S1, S2 normal, no murmur, click, rub or gallop GI: soft, non-tender; bowel sounds normal; no masses,  no organomegaly Neurologic: Grossly normal  Disposition: SNF  Discharge Orders    Future Appointments: Provider: Department: Dept Phone: Center:   08/12/2011 10:00 AM Cassell Clement, MD [facility] [phone] None   08/12/2011 10:20 AM Lorenda Cahill [facility] [phone] [facility]     Future Orders Please Complete By Expires   Diet - low sodium heart healthy      Increase activity slowly      Discharge instructions      Comments:   NEEDS TO WEAR OXYGEN AT 2 LPM AT ALL TIMES FOR THE NEXT 2 WEEKS   Incentive spirometry RT  05/22/12     Current Discharge Medication List    START taking these medications   Details  acetaminophen (TYLENOL) 325 MG tablet Take 2 tablets (650 mg total) by mouth every 6 (six) hours as needed (or Fever >/= 101). Qty: 30 tablet      docusate sodium 100 MG CAPS Take 100 mg by mouth 2 (two) times daily. Qty: 10 capsule    guaiFENesin (MUCINEX) 600 MG 12 hr tablet Take 1 tablet (600 mg total) by mouth 2 (two) times daily.    levalbuterol (XOPENEX HFA) 45 MCG/ACT inhaler Inhale 1-2 puffs into the lungs every 6 (six) hours as needed for wheezing. Qty: 1 Inhaler    levofloxacin (LEVAQUIN) 500 MG tablet Take 1 tablet (500 mg total) by mouth daily. Qty: 4 tablet, Refills: 0    metoprolol tartrate (LOPRESSOR) 25 MG tablet Take 1 tablet (25 mg total) by mouth 2 (two) times daily.    oseltamivir (TAMIFLU) 75 MG capsule Take 1 capsule (75 mg total) by mouth 2 (two) times daily. Qty: 10 capsule, Refills: 0    predniSONE (DELTASONE) 10 MG tablet 3 tabs daily for 3 days, then 2 tabs daily for 3 days, then 1 tab daily for 3 days, then STOP      CONTINUE these medications which have CHANGED   Details  amLODipine (NORVASC) 10 MG tablet Take 1 tablet (10 mg total) by mouth daily. Qty: 30 tablet, Refills: 11   Associated Diagnoses: Benign hypertensive heart disease without heart failure      CONTINUE these medications which have NOT CHANGED   Details  aspirin 81 MG tablet Take 81 mg by mouth 3 (three) times a week.      doxepin (SINEQUAN) 25 MG capsule Take 25 mg by mouth.      fish oil-omega-3 fatty acids 1000 MG capsule Take by mouth daily.      Multiple Vitamin (MULTIVITAMIN) tablet Take 1 tablet by mouth daily.      nitroGLYCERIN (NITROSTAT) 0.4 MG SL tablet Place 0.4 mg under the tongue every 5 (five) minutes as needed.        STOP taking these medications     Multiple Vitamins-Minerals (EYE VITAMINS PO)        Follow-up Information    Follow up with Cassell Clement, MD. Make an appointment in 1 week.         Total Discharge Time: 3 MINS  Signed: [facility]  [facility] Pager [phone]  05/23/2011, 11:27 AM

## 2011-05-23 NOTE — Progress Notes (Signed)
Occupational Therapy Treatment Patient Details Name: [name] MRN: [id number] DOB: 1911-06-09 Today's Date: 05/23/2011 1045 1111 1 [place]; cotx with PT  OT Assessment/Plan OT Assessment/Plan OT Plan: Discharge plan remains appropriate OT Frequency: Min 2X/week Follow Up Recommendations: Skilled nursing facility;Other (comment); DME recommendations deferred to next venue OT Goals ADL Goals Pt Will Perform Lower Body Bathing: with min assist;Sit to stand from chair ADL Goal: Lower Body Bathing - Progress: Met (simulated) Pt Will Transfer to Toilet: with min assist;Other (comment);Comfort height toilet;Ambulation ADL Goal: Toilet Transfer - Progress: Met; simulated  OT Treatment Precautions/Restrictions  Restrictions Weight Bearing Restrictions: No   ADL ADL Lower Body Bathing: Simulated;Minimal assistance;Other (comment) (pt had already bathed; used washcloth to simulate) Toilet Transfer: Simulated;Minimal assistance Toilet Transfer Method: Ambulating;Other (comment) (to recliner) ADL Comments: seen with PT.  Pt uses sock aid at home:  did not have with me this tx Mobility  Transfers Sit to Stand: 4: Min assist Exercises    End of Session OT - End of Session Equipment Utilized During Treatment: Gait belt Activity Tolerance: Patient tolerated treatment well Patient left: in chair;with call bell in reach;with family/visitor present General Behavior During Session: [facility] for tasks performed Cognition: [facility] for tasks performed  [name] [phone] 05/23/2011, 11:28 AM

## 2011-05-23 NOTE — Progress Notes (Signed)
CSW spoke with patients daughter, [name] [phone], she is agreeable to patient going to SNF at [facility]. Patient lives in the independent living there. CSW spoke with [facility]. They are agreeable to taking patient today. FL-2 completed and signed by the physician. ptar called for transportation. Neleh Muldoon C. Marx Doig MSW, LCSW [phone]

## 2011-05-24 DIAGNOSIS — R5381 Other malaise: Secondary | ICD-10-CM

## 2011-05-24 HISTORY — DX: Other malaise: R53.81

## 2011-05-29 ENCOUNTER — Encounter: Payer: Self-pay | Admitting: Cardiology

## 2011-05-29 ENCOUNTER — Ambulatory Visit (INDEPENDENT_AMBULATORY_CARE_PROVIDER_SITE_OTHER): Payer: Medicare Other | Admitting: Cardiology

## 2011-05-29 DIAGNOSIS — J849 Interstitial pulmonary disease, unspecified: Secondary | ICD-10-CM | POA: Insufficient documentation

## 2011-05-29 DIAGNOSIS — M169 Osteoarthritis of hip, unspecified: Secondary | ICD-10-CM

## 2011-05-29 DIAGNOSIS — H3530 Unspecified macular degeneration: Secondary | ICD-10-CM

## 2011-05-29 DIAGNOSIS — A0100 Typhoid fever, unspecified: Secondary | ICD-10-CM

## 2011-05-29 DIAGNOSIS — S32509A Unspecified fracture of unspecified pubis, initial encounter for closed fracture: Secondary | ICD-10-CM

## 2011-05-29 DIAGNOSIS — I5021 Acute systolic (congestive) heart failure: Secondary | ICD-10-CM

## 2011-05-29 DIAGNOSIS — I309 Acute pericarditis, unspecified: Secondary | ICD-10-CM

## 2011-05-29 DIAGNOSIS — I872 Venous insufficiency (chronic) (peripheral): Secondary | ICD-10-CM

## 2011-05-29 DIAGNOSIS — M62838 Other muscle spasm: Secondary | ICD-10-CM

## 2011-05-29 DIAGNOSIS — J8409 Other alveolar and parieto-alveolar conditions: Secondary | ICD-10-CM

## 2011-05-29 DIAGNOSIS — R269 Unspecified abnormalities of gait and mobility: Secondary | ICD-10-CM

## 2011-05-29 DIAGNOSIS — M519 Unspecified thoracic, thoracolumbar and lumbosacral intervertebral disc disorder: Secondary | ICD-10-CM

## 2011-05-29 DIAGNOSIS — I4891 Unspecified atrial fibrillation: Secondary | ICD-10-CM

## 2011-05-29 DIAGNOSIS — I259 Chronic ischemic heart disease, unspecified: Secondary | ICD-10-CM

## 2011-05-29 DIAGNOSIS — H259 Unspecified age-related cataract: Secondary | ICD-10-CM

## 2011-05-29 DIAGNOSIS — K269 Duodenal ulcer, unspecified as acute or chronic, without hemorrhage or perforation: Secondary | ICD-10-CM

## 2011-05-29 DIAGNOSIS — M1610 Unilateral primary osteoarthritis, unspecified hip: Secondary | ICD-10-CM

## 2011-05-29 HISTORY — DX: Unspecified fracture of unspecified pubis, initial encounter for closed fracture: S32.509A

## 2011-05-29 HISTORY — DX: Duodenal ulcer, unspecified as acute or chronic, without hemorrhage or perforation: K26.9

## 2011-05-29 HISTORY — DX: Venous insufficiency (chronic) (peripheral): I87.2

## 2011-05-29 HISTORY — DX: Unspecified age-related cataract: H25.9

## 2011-05-29 HISTORY — DX: Acute pericarditis, unspecified: I30.9

## 2011-05-29 HISTORY — DX: Unilateral primary osteoarthritis, unspecified hip: M16.10

## 2011-05-29 HISTORY — DX: Acute systolic (congestive) heart failure: I50.21

## 2011-05-29 HISTORY — DX: Osteoarthritis of hip, unspecified: M16.9

## 2011-05-29 HISTORY — DX: Unspecified thoracic, thoracolumbar and lumbosacral intervertebral disc disorder: M51.9

## 2011-05-29 HISTORY — DX: Unspecified macular degeneration: H35.30

## 2011-05-29 HISTORY — DX: Other muscle spasm: M62.838

## 2011-05-29 HISTORY — DX: Typhoid fever, unspecified: A01.00

## 2011-05-29 HISTORY — DX: Unspecified abnormalities of gait and mobility: R26.9

## 2011-05-29 NOTE — Assessment & Plan Note (Signed)
The patient is making a good recovery from his pneumonia.  Presently he is living at the rehabilitation section of [facility].  He was able to come off oxygen 2 days ago.  He is not coughing up any purulent sputum now and he has had no hemoptysis.  Has not been running any fever.  He is finishing up his course of antibiotics and tapering steroids.  We will plan to continue his Mucinex for another week and then stop it on January 16

## 2011-05-29 NOTE — Assessment & Plan Note (Signed)
The patient has not had any TIA symptoms.  His mentation has returned to baseline.  He is not having any symptoms of congestive heart failure

## 2011-05-29 NOTE — Patient Instructions (Signed)
Your physician recommends that you continue on your current medications as directed. Please refer to the Current Medication list given to you today. Keep your appointment with Dr Patty Sermons March 25 at 10:00 am

## 2011-05-29 NOTE — Progress Notes (Signed)
[facility] Date of Birth:  1911-10-08 [facility] [address] [place], [place]  [address] [phone]         Fax   [phone]  History of Present Illness: This pleasant 78 year old gentleman is seen for a post hospital visit.  He was recently hospitalized for 10 days at [facility] with acute pneumonia.  He did not require intubation.  He did have problems with disorientation and confusion while in the ICU.  He had problems with atrial fibrillation with rapid ventricular response secondary to the severity of his illness.  He is not on Coumadin for his atrial fibrillation because of prior history of recurrent severe GI bleeds  Current Outpatient Prescriptions  Medication Sig Dispense Refill  . acetaminophen (TYLENOL) 325 MG tablet Take 2 tablets (650 mg total) by mouth every 6 (six) hours as needed (or Fever >/= 101).  30 tablet    . amLODipine (NORVASC) 10 MG tablet Take 1 tablet (10 mg total) by mouth daily.  30 tablet  11  . aspirin 81 MG tablet Take 81 mg by mouth 3 (three) times a week.        . docusate sodium 100 MG CAPS Take 100 mg by mouth 2 (two) times daily.  10 capsule    . doxepin (SINEQUAN) 25 MG capsule Take 25 mg by mouth.        . fish oil-omega-3 fatty acids 1000 MG capsule Take by mouth daily.        Marland Kitchen guaiFENesin (MUCINEX) 600 MG 12 hr tablet Take 1 tablet (600 mg total) by mouth 2 (two) times daily.      Marland Kitchen levalbuterol (XOPENEX HFA) 45 MCG/ACT inhaler Inhale 1-2 puffs into the lungs every 6 (six) hours as needed for wheezing.  1 Inhaler    . levofloxacin (LEVAQUIN) 500 MG tablet Take 1 tablet (500 mg total) by mouth daily.  4 tablet  0  . metoprolol tartrate (LOPRESSOR) 25 MG tablet Take 1 tablet (25 mg total) by mouth 2 (two) times daily.      . Multiple Vitamin (MULTIVITAMIN) tablet Take 1 tablet by mouth daily.        . nitroGLYCERIN (NITROSTAT) 0.4 MG SL tablet Place 0.4 mg under the tongue every 5 (five) minutes as  needed.        Marland Kitchen oseltamivir (TAMIFLU) 75 MG capsule Take 1 capsule (75 mg total) by mouth 2 (two) times daily.  10 capsule  0  . predniSONE (DELTASONE) 10 MG tablet 3 tabs daily for 3 days, then 2 tabs daily for 3 days, then 1 tab daily for 3 days, then STOP        No Known Allergies  Patient Active Problem List  Diagnoses  . Atrial fibrillation  . Benign hypertensive heart disease without heart failure  . Chronic ischemic heart disease  . Inguinal hernia  . Community acquired pneumonia  . NSTEMI (non-ST elevated myocardial infarction)  . Influenza A  . Encephalopathy  . Thrombocytopenia  . Hypokalemia    History  Smoking status  . Former Smoker  . Quit date: 05/20/1974  Smokeless tobacco  . Not on file    History  Alcohol Use No    Family History  Problem Relation Age of Onset  . Heart attack Father   . Rheumatologic disease Mother     Review of Systems: Constitutional: no fever chills diaphoresis or fatigue or change in weight.  Head and neck: no hearing loss, no  epistaxis, no photophobia or visual disturbance. Respiratory: No cough, shortness of breath or wheezing. Cardiovascular: No chest pain peripheral edema, palpitations. Gastrointestinal: No abdominal distention, no abdominal pain, no change in bowel habits hematochezia or melena. Genitourinary: No dysuria, no frequency, no urgency, no nocturia. Musculoskeletal:No arthralgias, no back pain, no gait disturbance or myalgias. Neurological: No dizziness, no headaches, no numbness, no seizures, no syncope, no weakness, no tremors. Hematologic: No lymphadenopathy, no easy bruising. Psychiatric: No confusion, no hallucinations, no sleep disturbance.    Physical Exam: Filed Vitals:   05/29/11 1618  BP: 100/70  Pulse: 60   the general appearance reveals an elderly gentleman who arrives in a wheelchair.  His daughter brought him.Pupils equal and reactive.   Extraocular Movements are full.  There is no scleral  icterus.  The mouth and pharynx are normal.  The neck is supple.  The carotids reveal no bruits.  The jugular venous pressure is normal.  The thyroid is not enlarged.  There is no lymphadenopathy.  The chest is clear to percussion and auscultation. There are no rales or rhonchi. Expansion of the chest is symmetrical.  The heart reveals an irregular rhythm at 64 per minute.  No murmur gallop or rubThe abdomen is soft and nontender. Bowel sounds are normal. The liver and spleen are not enlarged. There Are no abdominal masses. There are no bruits.  Extremities reveal trace ankle edemaStrength is normal and symmetrical in all extremities.  There is no lateralizing weakness.  There are no sensory deficits.  The skin is warm and dry.  There is no rash.    Assessment / Plan: Continue present medication.  Continue Mucinex for another one week and after that just use when necessary for chest congestion.  Recheck here at his previously arranged office visit in late March with lab work.

## 2011-05-29 NOTE — Assessment & Plan Note (Signed)
The patient has not been experiencing any chest pain or angina pectoris.  He did have a bump in his cardiac enzymes while in the hospital secondary to the severity of his pneumonia.

## 2011-06-04 DIAGNOSIS — I498 Other specified cardiac arrhythmias: Secondary | ICD-10-CM

## 2011-06-04 HISTORY — DX: Other specified cardiac arrhythmias: I49.8

## 2011-06-05 ENCOUNTER — Telehealth: Payer: Self-pay | Admitting: *Deleted

## 2011-06-05 NOTE — Telephone Encounter (Signed)
I agree with plan

## 2011-06-05 NOTE — Telephone Encounter (Signed)
Claudette NP at [facility] phoned yesterday regarding patient having bradycardia.  She advised nurses to hold his evening dose of Metoprolol and would have them call back today with update.  Discussed with  Dr. Patty Sermons he wanted to decrease Metoprolol from twice daily to once a day and to hold if heart rate less than 60.  Called Claudette to discuss.  She stated his heart rate was 46 last night and 50 this am so she is holding.  Advised of  Dr. Yevonne Pax suggestion

## 2011-07-17 ENCOUNTER — Other Ambulatory Visit: Payer: Self-pay | Admitting: Internal Medicine

## 2011-07-17 ENCOUNTER — Ambulatory Visit
Admission: RE | Admit: 2011-07-17 | Discharge: 2011-07-17 | Disposition: A | Discharge status: Home / Self Care | Payer: Medicare Other | Source: Ambulatory Visit | Attending: Internal Medicine | Admitting: Internal Medicine

## 2011-07-17 DIAGNOSIS — J449 Chronic obstructive pulmonary disease, unspecified: Secondary | ICD-10-CM

## 2011-07-17 DIAGNOSIS — R0609 Other forms of dyspnea: Secondary | ICD-10-CM

## 2011-07-17 DIAGNOSIS — R0989 Other specified symptoms and signs involving the circulatory and respiratory systems: Secondary | ICD-10-CM

## 2011-07-17 DIAGNOSIS — R0602 Shortness of breath: Secondary | ICD-10-CM

## 2011-07-17 DIAGNOSIS — J4489 Other specified chronic obstructive pulmonary disease: Secondary | ICD-10-CM

## 2011-07-17 HISTORY — DX: Other specified chronic obstructive pulmonary disease: J44.89

## 2011-07-17 HISTORY — DX: Other specified symptoms and signs involving the circulatory and respiratory systems: R09.89

## 2011-07-17 HISTORY — DX: Other forms of dyspnea: R06.09

## 2011-07-17 HISTORY — DX: Chronic obstructive pulmonary disease, unspecified: J44.9

## 2011-08-07 ENCOUNTER — Ambulatory Visit (INDEPENDENT_AMBULATORY_CARE_PROVIDER_SITE_OTHER): Payer: Medicare Other | Admitting: Surgery

## 2011-08-07 ENCOUNTER — Encounter (INDEPENDENT_AMBULATORY_CARE_PROVIDER_SITE_OTHER): Payer: Self-pay | Admitting: Surgery

## 2011-08-07 ENCOUNTER — Encounter: Payer: Self-pay | Admitting: *Deleted

## 2011-08-07 DIAGNOSIS — K4090 Unilateral inguinal hernia, without obstruction or gangrene, not specified as recurrent: Secondary | ICD-10-CM

## 2011-08-07 NOTE — Progress Notes (Addendum)
Re:   [name] DOB:   04-29-1912 MRN:   [id number]  ASSESSMENT AND PLAN: 1.  Left inguinal hernia.  I have seen him before for this hernia, but I do not have his old chart today.  I discussed the indications and complications of hernia surgery with the patient.  I discussed an open approach to hernia repair..  The potential risks of hernia surgery include, but are not limited to, bleeding, infection, open surgery, nerve injury, and recurrence of the hernia.    I provided the patient literature about hernia surgery.  The big question is do you do anything for this gentleman.  We decided to see him back in 4 months.  If he is off the O2 and doing well, we would consider repairing the hernia.  If he is still on O2 or his heart is acting up, then we would not plan any surgery (and there is probably no reason for him to see me.). [I spoke with Dr. Gordy Levan.  DN 08/07/2011] 2.  Atrial fibrillation. 3.  Recent pneumonia.  Still on O2.  Will see if he can get off of the O2. 4.  Spinal stenosis. 5.  BPH.  Sees Dr. Annabell Howells.  Was seen 07/09/2011. 6.  History of CHF.  History of Chronic ischemic heart disease. 7.  Hearing loss. 8.  Has trouble with balance.  Chief Complaint  Patient presents with  . Inguinal Hernia    est pt- eval [facility]   REFERRING PHYSICIAN: GREEN, Lenon Curt, MD, MD  HISTORY OF PRESENT ILLNESS: [name] is a 78 y.o. (DOB: [birth year]) white  male whose primary care physician is GREEN, Lenon Curt, MD, MD and comes to me today for left inguinal hernia.  Most recently seen by Maxwell Marion, NP.  I tried to call her.  He lives at [facility].  He has had a left inguinal hernia for some time.  He had his right side repaired several years ago.  The hernia is larger over the last year.  He has had a prior right inguinal hernia.  Recently he was hospitalized at [facility] in Dec. 2012 for pnuemonia.  He has had some CHF.  He is on O2, since the pnuemonia.  He hopes to get off of  this.    Past Medical History  Diagnosis Date  . Atrial fibrillation     Not on Coumadin due to recurrent, severe GIB  . H/O: GI bleed   . Inguinal hernia   . Spinal stenosis     S/p surgery   . MI, old 30  . Diverticulitis   . BPH (benign prostatic hyperplasia)   . Anemia   . Arthritis   . Blood transfusion   . CHF (congestive heart failure)   . Hypertension   . Heart attack 1976      Past Surgical History  Procedure Date  . Spine surgery     Spinal stenosis surgery with Dr. Darrelyn Hillock   . Cholecystectomy   . Total hip arthroplasty     Right   . Vein surgery 2007  . Inguinal hernia repair 2003?    [facility]      Current Outpatient Prescriptions  Medication Sig Dispense Refill  . amLODipine (NORVASC) 10 MG tablet Take 1 tablet (10 mg total) by mouth daily.  30 tablet  11  . aspirin 81 MG tablet Take 81 mg by mouth 3 (three) times a week.        Marland Kitchen  doxepin (SINEQUAN) 25 MG capsule Take 25 mg by mouth.        . enalapril (VASOTEC) 2.5 MG tablet Take 2.5 mg by mouth daily.      . fish oil-omega-3 fatty acids 1000 MG capsule Take by mouth daily.        . furosemide (LASIX) 40 MG tablet Take 40 mg by mouth daily.      . Multiple Vitamin (MULTIVITAMIN) tablet Take 1 tablet by mouth daily.        . NON FORMULARY as needed. oxygen      . Tamsulosin HCl (FLOMAX) 0.4 MG CAPS Take by mouth.         No Known Allergies  REVIEW OF SYSTEMS: Skin:  No history of rash.  No history of abnormal moles. Infection:  No history of hepatitis or HIV.  No history of MRSA. Neurologic:  Balance issues.  Uses a walker. Cardiac:  History of A. fib.  History of CHF.  Sees Dr. Patty Sermons. Pulmonary:  Recent pneumonia - Dec 2012.  Hospitalized at [facility].  Now on O2.  Endocrine:  No diabetes. No thyroid disease. Gastrointestinal:  No history of stomach disease.  No history of liver disease.  No history of gall bladder disease.  No history of pancreas disease.  No history of colon disease. Urologic:   Sees Dr. Annabell Howells for BPH. Musculoskeletal:  Needs help getting on the exam table. Hematologic:  No bleeding disorder.  No history of anemia.  Not anticoagulated. Psycho-social:  The patient is oriented.   The patient has no obvious psychologic or social impairment to understanding our conversation and plan.  SOCIAL and FAMILY HISTORY: Accompanied with daughter, Gorden Ali. Lives at [facility].  PHYSICAL EXAM: BP 126/60  Pulse 64  Temp(Src) 97.1 F (36.2 C) (Temporal)  Resp 20  Ht 5\' 7"  (1.702 m)  Wt 143 lb 9.6 oz (65.137 kg)  BMI 22.49 kg/m2  General: WN WM, older, who is alert.  Has poor hearing.  Has balance issues. HEENT: Normal. Pupils equal. . Neck: Supple. No mass.  No thyroid mass.  Carotid pulse okay with no bruit. Lymph Nodes:  No supraclavicular or cervical nodes. Lungs: Clear to auscultation and symmetric breath sounds. Heart:  Irregular rate. No murmur or rub.  Abdomen: Soft. No mass. No tenderness. Normal bowel sounds.  No abdominal scars.  Medium sized left inguinal hernia.  It is reducible.  Scar at right inguinal area and right subcostal. Rectal: Not done. Extremities:  Needs assist getting on the exam table. Neurologic:  Grossly intact to motor and sensory function. Psychiatric: Has normal mood and affect. Behavior is normal.   DATA REVIEWED: Notes from [facility] and Dr. Annabell Howells.  Ovidio Kin, MD,  [facility] [address]  [address]   [place], [place]    [address] Phone:  [phone] FAX:  [phone]

## 2011-08-08 ENCOUNTER — Encounter: Payer: Self-pay | Admitting: Cardiology

## 2011-08-12 ENCOUNTER — Other Ambulatory Visit: Payer: Medicare Other

## 2011-08-12 ENCOUNTER — Ambulatory Visit: Payer: Medicare Other | Admitting: Cardiology

## 2011-08-23 NOTE — Telephone Encounter (Signed)
Close  

## 2011-09-17 ENCOUNTER — Other Ambulatory Visit: Payer: Self-pay | Admitting: Cardiology

## 2011-09-20 ENCOUNTER — Telehealth: Payer: Self-pay | Admitting: Cardiology

## 2011-09-20 MED ORDER — DOXEPIN HCL 25 MG PO CAPS: 25.0000 mg | ORAL_CAPSULE | Freq: Every day | ORAL | Status: DC

## 2011-09-20 NOTE — Telephone Encounter (Signed)
New msg Pt wants to talk to you. He doesn't hear well. I couldn't understand why.

## 2011-09-20 NOTE — Telephone Encounter (Signed)
[facility] calling back requesting refill again for this pt, pt out now and needs asap

## 2011-09-20 NOTE — Telephone Encounter (Signed)
Scheduled follow up appointment and refilled doxepin as requested

## 2011-10-21 ENCOUNTER — Encounter (INDEPENDENT_AMBULATORY_CARE_PROVIDER_SITE_OTHER): Payer: Self-pay | Admitting: Surgery

## 2011-10-24 ENCOUNTER — Encounter: Payer: Self-pay | Admitting: Cardiology

## 2011-10-24 ENCOUNTER — Ambulatory Visit (INDEPENDENT_AMBULATORY_CARE_PROVIDER_SITE_OTHER): Payer: Medicare Other | Admitting: Cardiology

## 2011-10-24 VITALS — BP 110/68 | HR 60 | Ht 67.0 in | Wt 145.0 lb

## 2011-10-24 DIAGNOSIS — I119 Hypertensive heart disease without heart failure: Secondary | ICD-10-CM

## 2011-10-24 DIAGNOSIS — I480 Paroxysmal atrial fibrillation: Secondary | ICD-10-CM | POA: Insufficient documentation

## 2011-10-24 DIAGNOSIS — I259 Chronic ischemic heart disease, unspecified: Secondary | ICD-10-CM

## 2011-10-24 DIAGNOSIS — I4891 Unspecified atrial fibrillation: Secondary | ICD-10-CM

## 2011-10-24 NOTE — Assessment & Plan Note (Signed)
The patient has a past history of high blood pressure.  His blood pressure was remaining normal on current therapy.  He has not had any dizziness or syncope

## 2011-10-24 NOTE — Assessment & Plan Note (Signed)
The patient has not been aware of any palpitations or skipping of his heart.  His EKG today shows that he is in sinus bradycardia at 50 per minute.  He previously had been on metoprolol which has been stopped because of his bradycardia.  At the present time he is on no heart rate reducing medication.  He is not having any symptoms from his sinus bradycardia and does not require a pacemaker.

## 2011-10-24 NOTE — Progress Notes (Signed)
[name] Date of Birth:  [birth year] [facility] [address] [place], [place]  [address] [phone]         Fax   [phone]  History of Present Illness: This pleasant 78 year old gentleman is seen for a four-month followup office visit.  He has a history of paroxysmal atrial fibrillation.  He was hospitalized over Christmas 2012 for 10 days at [facility] because of acute pneumonia.  He did not require intubation but was seriously ill with problems of disorientation and confusion while in the intensive care unit.  He also was in atrial flutter with a rapid ventricular response.  He has a past history of paroxysmal atrial fib but is not on Coumadin because of his age and because of the previous history of recurrent GI bleeds.  The patient has not been having any hematochezia or melena  Current Outpatient Prescriptions  Medication Sig Dispense Refill  . amLODipine (NORVASC) 10 MG tablet Take 1 tablet (10 mg total) by mouth daily.  30 tablet  11  . aspirin 81 MG tablet Take 81 mg by mouth 3 (three) times a week.        . doxepin (SINEQUAN) 25 MG capsule Take 1 capsule (25 mg total) by mouth at bedtime.  90 capsule  0  . enalapril (VASOTEC) 2.5 MG tablet Take 2.5 mg by mouth daily.      . fish oil-omega-3 fatty acids 1000 MG capsule Take by mouth daily.        . furosemide (LASIX) 40 MG tablet Take 40 mg by mouth daily.      . Multiple Vitamin (MULTIVITAMIN) tablet Take 1 tablet by mouth daily.        . NON FORMULARY as needed. oxygen      . Tamsulosin HCl (FLOMAX) 0.4 MG CAPS Take by mouth.      . DISCONTD: levalbuterol (XOPENEX HFA) 45 MCG/ACT inhaler Inhale 1-2 puffs into the lungs every 6 (six) hours as needed for wheezing.  1 Inhaler    . DISCONTD: metoprolol tartrate (LOPRESSOR) 25 MG tablet Take 1 tablet (25 mg total) by mouth 2 (two) times daily.      Marland Kitchen DISCONTD: nitroGLYCERIN (NITROSTAT) 0.4 MG SL tablet Place 0.4 mg under the tongue every 5  (five) minutes as needed.          No Known Allergies  Patient Active Problem List  Diagnoses  . Atrial fibrillation  . Benign hypertensive heart disease without heart failure  . Chronic ischemic heart disease  . Inguinal hernia, left.  . Community acquired pneumonia  . NSTEMI (non-ST elevated myocardial infarction)  . Influenza A  . Encephalopathy  . Thrombocytopenia  . Hypokalemia  . Acute interstitial pneumonia  . PAF (paroxysmal atrial fibrillation)    History  Smoking status  . Former Smoker  . Quit date: 05/20/1974  Smokeless tobacco  . Not on file    History  Alcohol Use  . Yes    Family History  Problem Relation Age of Onset  . Heart attack Father   . Heart disease Father   . Rheumatologic disease Mother     Review of Systems: Constitutional: no fever chills diaphoresis or fatigue or change in weight.  Head and neck: no hearing loss, no epistaxis, no photophobia or visual disturbance. Respiratory: No cough, shortness of breath or wheezing. Cardiovascular: No chest pain peripheral edema, palpitations. Gastrointestinal: No abdominal distention, no abdominal pain, no change in bowel habits hematochezia or melena.  Genitourinary: No dysuria, no frequency, no urgency, no nocturia. Musculoskeletal:No arthralgias, no back pain, no gait disturbance or myalgias. Neurological: No dizziness, no headaches, no numbness, no seizures, no syncope, no weakness, no tremors. Hematologic: No lymphadenopathy, no easy bruising. Psychiatric: No confusion, no hallucinations, no sleep disturbance.    Physical Exam: Filed Vitals:   10/24/11 0958  BP: 110/68  Pulse: 60   the general appearance reveals a well-developed well-nourished gentleman in no distress.  He is very alert for his age of 55.  He walks with a walker.Pupils equal and reactive.   Extraocular Movements are full.  There is no scleral icterus.  The mouth and pharynx are normal.  The neck is supple.  The carotids  reveal no bruits.  The jugular venous pressure is normal.  The thyroid is not enlarged.  There is no lymphadenopathy.  The chest is clear to percussion and auscultation. There are no rales or rhonchi. Expansion of the chest is symmetrical.  The precordium is quiet.  The first heart sound is normal.  The second heart sound is physiologically split.  There is no murmur gallop rub or click.  There is no abnormal lift or heave.  The abdomen is soft and nontender. Bowel sounds are normal. The liver and spleen are not enlarged. There Are no abdominal masses. There are no bruits.  He does have a left inguinal hernia which was not checked today. The pedal pulses are good.  There is no phlebitis or edema.  There is no cyanosis or clubbing.  He does wear support hose because of venous insufficiency Strength is normal and symmetrical in all extremities.  There is no lateralizing weakness.  There are no sensory deficits.  The skin is warm and dry.  There is no rash.  EKG today confirms sinus bradycardia.  He also has a bifascicular block and first degree AV block.     Assessment / Plan: Continue same medication.  Recheck in 4 months for followup office visit.

## 2011-10-24 NOTE — Patient Instructions (Signed)
Your physician recommends that you continue on your current medications as directed. Please refer to the Current Medication list given to you today. Your physician wants you to follow-up in: 4 months You will receive a reminder letter in the mail two months in advance. If you don't receive a letter, please call our office to schedule the follow-up appointment.  

## 2011-10-24 NOTE — Assessment & Plan Note (Signed)
The patient has a history of chronic ischemic heart disease.  While he was hospitalized for his acute pneumonia and had rapid atrial fib he did have a bump in his enzymes consistent with a non-STEMI secondary to demand ischemia.  This did not require any further workup.  He has had no recent chest pain and has not had to take any recent sublingual nitroglycerin

## 2011-10-29 NOTE — Progress Notes (Signed)
Addended by: Reine Just on: 10/29/2011 05:02 PM   Modules accepted: Orders

## 2011-11-20 DIAGNOSIS — M25579 Pain in unspecified ankle and joints of unspecified foot: Secondary | ICD-10-CM

## 2011-11-20 HISTORY — DX: Pain in unspecified ankle and joints of unspecified foot: M25.579

## 2011-11-22 ENCOUNTER — Ambulatory Visit
Admission: RE | Admit: 2011-11-22 | Discharge: 2011-11-22 | Disposition: A | Discharge status: Home / Self Care | Payer: Medicare Other | Source: Ambulatory Visit | Attending: Internal Medicine | Admitting: Internal Medicine

## 2011-11-22 ENCOUNTER — Other Ambulatory Visit: Payer: Self-pay | Admitting: Geriatric Medicine

## 2011-11-22 DIAGNOSIS — M79672 Pain in left foot: Secondary | ICD-10-CM

## 2011-12-09 ENCOUNTER — Other Ambulatory Visit: Payer: Self-pay | Admitting: Cardiology

## 2011-12-09 NOTE — Telephone Encounter (Signed)
.   Requested Prescriptions   Signed Prescriptions Disp Refills  . amLODipine (NORVASC) 5 MG tablet 30 tablet 11    Sig: take 1 tablet by mouth once daily    Authorizing Provider: Cassell Clement    Ordering User: Lacie Scotts

## 2011-12-18 DIAGNOSIS — L84 Corns and callosities: Secondary | ICD-10-CM

## 2011-12-18 HISTORY — DX: Corns and callosities: L84

## 2011-12-19 ENCOUNTER — Other Ambulatory Visit: Payer: Self-pay | Admitting: Cardiology

## 2011-12-23 ENCOUNTER — Other Ambulatory Visit: Payer: Self-pay | Admitting: *Deleted

## 2011-12-23 DIAGNOSIS — G4700 Insomnia, unspecified: Secondary | ICD-10-CM

## 2011-12-23 MED ORDER — DOXEPIN HCL 25 MG PO CAPS: 25.0000 mg | ORAL_CAPSULE | Freq: Every day | ORAL | Status: DC

## 2011-12-23 NOTE — Telephone Encounter (Signed)
Refilled sinequan

## 2012-01-10 ENCOUNTER — Encounter (INDEPENDENT_AMBULATORY_CARE_PROVIDER_SITE_OTHER): Payer: Medicare Other | Admitting: Surgery

## 2012-01-17 ENCOUNTER — Encounter (INDEPENDENT_AMBULATORY_CARE_PROVIDER_SITE_OTHER): Payer: Medicare Other | Admitting: Surgery

## 2012-03-02 ENCOUNTER — Encounter: Payer: Self-pay | Admitting: Cardiology

## 2012-03-02 ENCOUNTER — Ambulatory Visit (INDEPENDENT_AMBULATORY_CARE_PROVIDER_SITE_OTHER): Payer: Medicare Other | Admitting: Cardiology

## 2012-03-02 VITALS — BP 104/58 | HR 75 | Ht 64.0 in | Wt 149.8 lb

## 2012-03-02 DIAGNOSIS — I480 Paroxysmal atrial fibrillation: Secondary | ICD-10-CM

## 2012-03-02 DIAGNOSIS — I259 Chronic ischemic heart disease, unspecified: Secondary | ICD-10-CM

## 2012-03-02 DIAGNOSIS — I119 Hypertensive heart disease without heart failure: Secondary | ICD-10-CM

## 2012-03-02 DIAGNOSIS — I4891 Unspecified atrial fibrillation: Secondary | ICD-10-CM

## 2012-03-02 NOTE — Patient Instructions (Signed)
Your physician recommends that you continue on your current medications as directed. Please refer to the Current Medication list given to you today.  Your physician recommends that you schedule a follow-up appointment in: 4 months with labs (cbc/ekg/bmet)

## 2012-03-02 NOTE — Assessment & Plan Note (Signed)
The patient has a past history of hypertension.  He has not been experiencing any angina.  No dizziness or syncope.  He does have poor balance and he now uses a rolling walker at all times.

## 2012-03-02 NOTE — Assessment & Plan Note (Signed)
The patient has a remote history of coronary artery disease.  He has not had any recent chest pain or angina pectoris or symptoms of CHF

## 2012-03-02 NOTE — Assessment & Plan Note (Signed)
The patient has not been aware of any palpitations or arrhythmia.  At his last visit 4 months ago he was back in normal sinus rhythm.  His rhythm is regular today and we did not do an EKG today.. he has not had any TIA symptoms

## 2012-03-02 NOTE — Progress Notes (Signed)
[name] Date of Birth:  [birth year] [name] [address] [place], [place]  [address] [phone]         Fax   [phone]  History of Present Illness: This pleasant 78 year old gentleman is seen for a four-month followup office visit. He has a history of paroxysmal atrial fibrillation. He was hospitalized over Christmas 2012 for 10 days at [facility] because of acute pneumonia. He did not require intubation but was seriously ill with problems of disorientation and confusion while in the intensive care unit. He also was in atrial flutter with a rapid ventricular response. He has a past history of paroxysmal atrial fib but is not on Coumadin because of his age and because of the previous history of recurrent GI bleeds. The patient has not been having any hematochezia or melena.   Current Outpatient Prescriptions  Medication Sig Dispense Refill  . amLODipine (NORVASC) 5 MG tablet take 1 tablet by mouth once daily  30 tablet  11  . aspirin 81 MG tablet Take 81 mg by mouth 3 (three) times a week.        . doxepin (SINEQUAN) 25 MG capsule Take 1 capsule (25 mg total) by mouth at bedtime.  90 capsule  0  . enalapril (VASOTEC) 2.5 MG tablet Take 2.5 mg by mouth daily.      . fish oil-omega-3 fatty acids 1000 MG capsule Take by mouth daily.        . furosemide (LASIX) 40 MG tablet Take 40 mg by mouth daily.      . Multiple Vitamin (MULTIVITAMIN) tablet Take 1 tablet by mouth daily.        . NON FORMULARY as needed. oxygen      . Tamsulosin HCl (FLOMAX) 0.4 MG CAPS Take by mouth.      . DISCONTD: amLODipine (NORVASC) 10 MG tablet Take 1 tablet (10 mg total) by mouth daily.  30 tablet  11  . DISCONTD: levalbuterol (XOPENEX HFA) 45 MCG/ACT inhaler Inhale 1-2 puffs into the lungs every 6 (six) hours as needed for wheezing.  1 Inhaler    . DISCONTD: metoprolol tartrate (LOPRESSOR) 25 MG tablet Take 1 tablet (25 mg total) by mouth 2 (two) times daily.        Marland Kitchen DISCONTD: nitroGLYCERIN (NITROSTAT) 0.4 MG SL tablet Place 0.4 mg under the tongue every 5 (five) minutes as needed.          Allergies  Allergen Reactions  . Metoprolol     Patient Active Problem List  Diagnosis  . Atrial fibrillation  . Benign hypertensive heart disease without heart failure  . Chronic ischemic heart disease  . Inguinal hernia, left.  . Community acquired pneumonia  . NSTEMI (non-ST elevated myocardial infarction)  . Influenza A  . Encephalopathy  . Thrombocytopenia  . Hypokalemia  . Acute interstitial pneumonia  . PAF (paroxysmal atrial fibrillation)    History  Smoking status  . Former Smoker  . Quit date: 05/20/1974  Smokeless tobacco  . Not on file    History  Alcohol Use  . Yes    Family History  Problem Relation Age of Onset  . Heart attack Father   . Heart disease Father   . Rheumatologic disease Mother     Review of Systems: Constitutional: no fever chills diaphoresis or fatigue or change in weight.  Head and neck: no hearing loss, no epistaxis, no photophobia or visual disturbance. Respiratory: No cough, shortness of breath or  wheezing. Cardiovascular: No chest pain peripheral edema, palpitations. Gastrointestinal: No abdominal distention, no abdominal pain, no change in bowel habits hematochezia or melena. Genitourinary: No dysuria, no frequency, no urgency, no nocturia. Musculoskeletal:No arthralgias, no back pain, no gait disturbance or myalgias. Neurological: No dizziness, no headaches, no numbness, no seizures, no syncope, no weakness, no tremors. Hematologic: No lymphadenopathy, no easy bruising. Psychiatric: No confusion, no hallucinations, no sleep disturbance.    Physical Exam: Filed Vitals:   03/02/12 1115  BP: 104/58  Pulse: 75   the general appearance reveals a alert 77 year old gentleman in no distress.The head and neck exam reveals pupils equal and reactive.  Extraocular movements are full.  There is no  scleral icterus.  The mouth and pharynx are normal.  The neck is supple.  The carotids reveal no bruits.  The jugular venous pressure is normal.  The  thyroid is not enlarged.  There is no lymphadenopathy.  The chest is clear to percussion and auscultation.  There are no rales or rhonchi.  Expansion of the chest is symmetrical.  The precordium is quiet.  The first heart sound is normal.  The second heart sound is physiologically split.  There is no murmur gallop rub or click.  There is no abnormal lift or heave.  The abdomen is soft and nontender.  The bowel sounds are normal.  The liver and spleen are not enlarged.  There are no abdominal masses.  There are no abdominal bruits.  Extremities reveal good pedal pulses.  There is no phlebitis or edema.  There is no cyanosis or clubbing.  Strength is normal and symmetrical in all extremities.  There is no lateralizing weakness.  There are no sensory deficits.  The skin is warm and dry.  There is no rash.    Assessment / Plan: The patient is to continue same medication.  Recheck in 4 months for followup office visit EKG CBC and basal metabolic panel. Incidentally the patient has been experiencing a painful corn on his right foot and we gave him the name of Dr. Harriet Pho whom he will call for podiatry care.

## 2012-03-24 ENCOUNTER — Other Ambulatory Visit: Payer: Self-pay | Admitting: Cardiology

## 2012-03-24 DIAGNOSIS — F329 Major depressive disorder, single episode, unspecified: Secondary | ICD-10-CM

## 2012-03-25 DIAGNOSIS — G609 Hereditary and idiopathic neuropathy, unspecified: Secondary | ICD-10-CM

## 2012-03-25 HISTORY — DX: Hereditary and idiopathic neuropathy, unspecified: G60.9

## 2012-06-25 ENCOUNTER — Other Ambulatory Visit: Payer: Self-pay | Admitting: Cardiology

## 2012-06-25 DIAGNOSIS — F329 Major depressive disorder, single episode, unspecified: Secondary | ICD-10-CM

## 2012-06-25 NOTE — Telephone Encounter (Signed)
Pt wants to change Pharm please call doxepin 25 mg into [facility] on [address] right off [address] phone# [phone]

## 2012-06-26 MED ORDER — DOXEPIN HCL 25 MG PO CAPS: 25.0000 mg | ORAL_CAPSULE | Freq: Every day | ORAL | Status: DC

## 2012-07-01 ENCOUNTER — Ambulatory Visit (INDEPENDENT_AMBULATORY_CARE_PROVIDER_SITE_OTHER): Payer: Medicare Other | Admitting: Cardiology

## 2012-07-01 ENCOUNTER — Encounter: Payer: Self-pay | Admitting: Cardiology

## 2012-07-01 ENCOUNTER — Other Ambulatory Visit (INDEPENDENT_AMBULATORY_CARE_PROVIDER_SITE_OTHER): Payer: Medicare Other

## 2012-07-01 VITALS — BP 112/64 | HR 70 | Ht 66.0 in | Wt 153.0 lb

## 2012-07-01 DIAGNOSIS — I480 Paroxysmal atrial fibrillation: Secondary | ICD-10-CM

## 2012-07-01 DIAGNOSIS — I259 Chronic ischemic heart disease, unspecified: Secondary | ICD-10-CM

## 2012-07-01 DIAGNOSIS — K5730 Diverticulosis of large intestine without perforation or abscess without bleeding: Secondary | ICD-10-CM

## 2012-07-01 DIAGNOSIS — I4891 Unspecified atrial fibrillation: Secondary | ICD-10-CM

## 2012-07-01 DIAGNOSIS — I4892 Unspecified atrial flutter: Secondary | ICD-10-CM

## 2012-07-01 DIAGNOSIS — R0989 Other specified symptoms and signs involving the circulatory and respiratory systems: Secondary | ICD-10-CM

## 2012-07-01 LAB — CBC WITH DIFFERENTIAL/PLATELET
Eosinophils Absolute: 0.1 10*3/uL (ref 0.0–0.7)
Eosinophils Relative: 2.4 % (ref 0.0–5.0)
Lymphocytes Relative: 20.1 % (ref 12.0–46.0)
MCV: 101.4 fl — ABNORMAL HIGH (ref 78.0–100.0)
Monocytes Absolute: 0.5 10*3/uL (ref 0.1–1.0)
Neutrophils Relative %: 64.5 % (ref 43.0–77.0)
Platelets: 114 10*3/uL — ABNORMAL LOW (ref 150.0–400.0)
WBC: 4 10*3/uL — ABNORMAL LOW (ref 4.5–10.5)

## 2012-07-01 LAB — BASIC METABOLIC PANEL
Chloride: 99 mEq/L (ref 96–112)
GFR: 61.08 mL/min (ref >60.00)
Glucose, Bld: 82 mg/dL (ref 70–99)
Potassium: 4.5 mEq/L (ref 3.5–5.1)
Sodium: 132 mEq/L — ABNORMAL LOW (ref 135–145)

## 2012-07-01 NOTE — Assessment & Plan Note (Signed)
The patient has not been having any chest pain to suggest recurrent angina pectoris.  He continues to live in his own cottage at [place].  The only driving he does is to drive his car from his cottage to the dining hall for meals.  He no longer drives out on the highway.

## 2012-07-01 NOTE — Progress Notes (Signed)
[name] Date of Birth:  August 08, 1911 [facility] [address] [place], [place]  [address] [phone]         Fax   [phone]  History of Present Illness: This pleasant 79 year old gentleman is seen for a four-month followup office visit. He has a history of paroxysmal atrial fibrillation. He was hospitalized over Christmas 2012 for 10 days at [facility] because of acute pneumonia. He did not require intubation but was seriously ill with problems of disorientation and confusion while in the intensive care unit. He also was in atrial flutter with a rapid ventricular response. He has a past history of paroxysmal atrial fib but is not on Coumadin because of his age and because of the previous history of recurrent GI bleeds. The patient has not been having any hematochezia or melena.  He recently saw his gastroenterologist Dr. Matthias Hughs who had him increase his MiraLax from a standard dose to 1-1/4 doses daily and since then the patient's bowel movements have been doing well.   Current Outpatient Prescriptions  Medication Sig Dispense Refill  . amLODipine (NORVASC) 5 MG tablet take 1 tablet by mouth once daily  30 tablet  11  . aspirin 81 MG tablet Take 81 mg by mouth 3 (three) times a week.        . doxepin (SINEQUAN) 25 MG capsule Take 1 capsule (25 mg total) by mouth at bedtime.  90 capsule  1  . enalapril (VASOTEC) 2.5 MG tablet Take 2.5 mg by mouth daily.      . fish oil-omega-3 fatty acids 1000 MG capsule Take by mouth daily.        . furosemide (LASIX) 40 MG tablet Take 40 mg by mouth daily.      . Multiple Vitamin (MULTIVITAMIN) tablet Take 1 tablet by mouth daily.        . NON FORMULARY as needed. oxygen      . Tamsulosin HCl (FLOMAX) 0.4 MG CAPS Take by mouth.      . [DISCONTINUED] levalbuterol (XOPENEX HFA) 45 MCG/ACT inhaler Inhale 1-2 puffs into the lungs every 6 (six) hours as needed for wheezing.  1 Inhaler    . [DISCONTINUED] metoprolol  tartrate (LOPRESSOR) 25 MG tablet Take 1 tablet (25 mg total) by mouth 2 (two) times daily.      . [DISCONTINUED] nitroGLYCERIN (NITROSTAT) 0.4 MG SL tablet Place 0.4 mg under the tongue every 5 (five) minutes as needed.         No current facility-administered medications for this visit.    Allergies  Allergen Reactions  . Metoprolol     Patient Active Problem List  Diagnosis  . Atrial fibrillation  . Benign hypertensive heart disease without heart failure  . Chronic ischemic heart disease  . Inguinal hernia, left.  . Community acquired pneumonia  . NSTEMI (non-ST elevated myocardial infarction)  . Influenza A  . Encephalopathy  . Thrombocytopenia  . Hypokalemia  . Acute interstitial pneumonia  . PAF (paroxysmal atrial fibrillation)    History  Smoking status  . Former Smoker  . Quit date: 05/20/1974  Smokeless tobacco  . Not on file    History  Alcohol Use  . Yes    Family History  Problem Relation Age of Onset  . Heart attack Father   . Heart disease Father   . Rheumatologic disease Mother     Review of Systems: Constitutional: no fever chills diaphoresis or fatigue or change in weight.  Head and neck: no hearing loss, no epistaxis, no photophobia or visual disturbance. Respiratory: No cough, shortness of breath or wheezing. Cardiovascular: No chest pain peripheral edema, palpitations. Gastrointestinal: No abdominal distention, no abdominal pain, no change in bowel habits hematochezia or melena. Genitourinary: No dysuria, no frequency, no urgency, no nocturia. Musculoskeletal:No arthralgias, no back pain, no gait disturbance or myalgias. Neurological: No dizziness, no headaches, no numbness, no seizures, no syncope, no weakness, no tremors. Hematologic: No lymphadenopathy, no easy bruising. Psychiatric: No confusion, no hallucinations, no sleep disturbance.    Physical Exam: Filed Vitals:   07/01/12 0958  BP: 112/64  Pulse: 70     Assessment /  Plan:       Current Outpatient Prescriptions  Medication Sig Dispense Refill  . amLODipine (NORVASC) 5 MG tablet take 1 tablet by mouth once daily  30 tablet  11  . aspirin 81 MG tablet Take 81 mg by mouth 3 (three) times a week.        . doxepin (SINEQUAN) 25 MG capsule Take 1 capsule (25 mg total) by mouth at bedtime.  90 capsule  1  . enalapril (VASOTEC) 2.5 MG tablet Take 2.5 mg by mouth daily.      . fish oil-omega-3 fatty acids 1000 MG capsule Take by mouth daily.        . furosemide (LASIX) 40 MG tablet Take 40 mg by mouth daily.      . Multiple Vitamin (MULTIVITAMIN) tablet Take 1 tablet by mouth daily.        . NON FORMULARY as needed. oxygen      . Tamsulosin HCl (FLOMAX) 0.4 MG CAPS Take by mouth.      . [DISCONTINUED] levalbuterol (XOPENEX HFA) 45 MCG/ACT inhaler Inhale 1-2 puffs into the lungs every 6 (six) hours as needed for wheezing.  1 Inhaler    . [DISCONTINUED] metoprolol tartrate (LOPRESSOR) 25 MG tablet Take 1 tablet (25 mg total) by mouth 2 (two) times daily.      . [DISCONTINUED] nitroGLYCERIN (NITROSTAT) 0.4 MG SL tablet Place 0.4 mg under the tongue every 5 (five) minutes as needed.         No current facility-administered medications for this visit.    Allergies  Allergen Reactions  . Metoprolol     Patient Active Problem List  Diagnosis  . Atrial fibrillation  . Benign hypertensive heart disease without heart failure  . Chronic ischemic heart disease  . Inguinal hernia, left.  . Community acquired pneumonia  . NSTEMI (non-ST elevated myocardial infarction)  . Influenza A  . Encephalopathy  . Thrombocytopenia  . Hypokalemia  . Acute interstitial pneumonia  . PAF (paroxysmal atrial fibrillation)    History  Smoking status  . Former Smoker  . Quit date: 05/20/1974  Smokeless tobacco  . Not on file    History  Alcohol Use  . Yes    Family History  Problem Relation Age of Onset  . Heart attack Father   . Heart disease Father   .  Rheumatologic disease Mother     Review of Systems: Constitutional: no fever chills diaphoresis or fatigue or change in weight.  Head and neck: no hearing loss, no epistaxis, no photophobia or visual disturbance. Respiratory: No cough, shortness of breath or wheezing. Cardiovascular: No chest pain peripheral edema, palpitations. Gastrointestinal: No abdominal distention, no abdominal pain, no change in bowel habits hematochezia or melena. Genitourinary: No dysuria, no frequency, no urgency, no nocturia. Musculoskeletal:No arthralgias, no back pain, no gait  disturbance or myalgias. Neurological: No dizziness, no headaches, no numbness, no seizures, no syncope, no weakness, no tremors. Hematologic: No lymphadenopathy, no easy bruising. Psychiatric: No confusion, no hallucinations, no sleep disturbance.    Physical Exam: Filed Vitals:   07/01/12 0958  BP: 112/64  Pulse: 70   the general appearance reveals an elderly gentleman in no acute distress.The head and neck exam reveals pupils equal and reactive.  Extraocular movements are full.  There is no scleral icterus.  The mouth and pharynx are normal.  The neck is supple.  The carotids reveal no bruits.  The jugular venous pressure is normal.  The  thyroid is not enlarged.  There is no lymphadenopathy.  The chest is clear to percussion and auscultation.  There are no rales or rhonchi.  Expansion of the chest is symmetrical.  The precordium is quiet.  The first heart sound is normal.  The second heart sound is physiologically split.  There is no murmur gallop rub or click.  There is no abnormal lift or heave.  The abdomen is soft and nontender.  The bowel sounds are normal.  The liver and spleen are not enlarged.  There are no abdominal masses.  There are no abdominal bruits.  Extremities reveal good pedal pulses.  There is no phlebitis or edema.  There is no cyanosis or clubbing.  Strength is normal and symmetrical in all extremities.  There is no  lateralizing weakness.  There are no sensory deficits.  The skin is warm and dry.  There is no rash.  EKG shows atrial flutter with slow ventricular response and a ventricular rate of 70   Assessment / Plan: Continue current medication.  We are checking a CBC and a basal metabolic panel today.  Recheck in 4 months.

## 2012-07-01 NOTE — Assessment & Plan Note (Signed)
The patient has a past history of paroxysmal atrial fibrillation with controlled ventricular response.  At his last visit his electrocardiogram confirmed that he was in sinus bradycardia.  Today the patient's EKG shows that he was back in atrial flutter with a controlled ventricular response.  The patient himself is not aware of his arrhythmia.  He is not having any exertional chest pain or increased shortness of breath.  He has had no dizziness or syncope.  His balance is poor and he walks with a walker.

## 2012-07-01 NOTE — Patient Instructions (Addendum)
Will obtain labs today and call you with the results (bmet/cbc)  Your physician recommends that you continue on your current medications as directed. Please refer to the Current Medication list given to you today.  Your physician recommends that you schedule a follow-up appointment in: 4 months

## 2012-07-01 NOTE — Assessment & Plan Note (Signed)
The patient has a past history of diverticulosis of the colon with previous severe hemorrhage requiring transfusions.  He has not had a severe hemorrhage in several years.  He has not been aware of any recent hematochezia or melena.  His stools are now regular on his current dose of daily MiraLax which Dr. Matthias Hughs said he could easily take for the rest of his life.

## 2012-07-01 NOTE — Progress Notes (Signed)
Quick Note:  Please report to patient. The recent labs are stable. Continue same medication and careful diet. Kidneys are a little drier. Drink plenty of water ______

## 2012-09-23 ENCOUNTER — Encounter: Payer: Self-pay | Admitting: Geriatric Medicine

## 2012-09-23 ENCOUNTER — Non-Acute Institutional Stay: Payer: Medicare Other | Admitting: Geriatric Medicine

## 2012-09-23 VITALS — BP 104/66 | HR 72 | Ht 66.0 in | Wt 149.0 lb

## 2012-09-23 DIAGNOSIS — K5900 Constipation, unspecified: Secondary | ICD-10-CM | POA: Insufficient documentation

## 2012-09-23 DIAGNOSIS — I5021 Acute systolic (congestive) heart failure: Secondary | ICD-10-CM

## 2012-09-23 DIAGNOSIS — G609 Hereditary and idiopathic neuropathy, unspecified: Secondary | ICD-10-CM | POA: Insufficient documentation

## 2012-09-23 DIAGNOSIS — I10 Essential (primary) hypertension: Secondary | ICD-10-CM | POA: Insufficient documentation

## 2012-09-23 HISTORY — DX: Essential (primary) hypertension: I10

## 2012-09-23 MED ORDER — FUROSEMIDE 40 MG PO TABS: 40.0000 mg | ORAL_TABLET | Freq: Every day | ORAL | Status: DC

## 2012-09-23 NOTE — Assessment & Plan Note (Signed)
Patient remains without any cough, shortness of breath or peripheral edema. His activity tolerance is unchanged. Most recent labs January 2014 satisfactory. Will decrease dose of Lasix as blood pressure is a bit low and he has no peripheral edema.

## 2012-09-23 NOTE — Assessment & Plan Note (Addendum)
Left foot discomfort is unchanged, he continues to get up and move his leg around if it is bothersome at night. No further evaluation or intervention required at this time

## 2012-09-23 NOTE — Progress Notes (Signed)
Patient ID: [name], male   DOB: 07-09-1911, 79 y.o.   MRN: [id number] Chief Complaint  Patient presents with  . Medical Managment of Chronic Issues    blood pressure, constipation, Peripheral Neuropathy     HPI: This 79 year old male resident of [facility], Independent Living section, returns to clinic for routine three-month followup. Patient tells me he had a very good time celebrating his 89th birthday April [birth year]. He generally is feeling well, enjoys family visits as well as some social activity. Tells me he is not really hungry but he does eat 3 meals a day including dinner in the dining room. Daughter [name] usually accompanies him to his office visit, she is here today.   Allergies  Allergies  Allergen Reactions  . Metoprolol    Medications  Reviewed Data Reviewed        WUJ:WJXBJYN, external 12/10/2011 BMP: Sodium 137, Potassium 4.2, glucose 88, BUN 31, Creatinine 1.18 06/16/2012 CBC: Wbc 4.1, Rbc 3.95, Hct 38.8, Platelet 119 ,Plt 119 CMP: Sodium 136, Potassium 4.6, glucose 87, BUN 35, Creatinine 1.30 protein/LFTs WNL  GFR 49.5 TSH 3.224      Review of Systems   DATA OBTAINED: from patient GENERAL: Feels well. No fevers, fatigue, change in activity status. No change in appetite, weight . SKIN: No itch, rash or open wounds EYES: No eye pain, dryness or itching. No change in vision EARS: No earache, change in hearing NOSE: No congestion, drainage or bleeding MOUTH/THROAT: No mouth or tooth pain. No difficulty chewing or swallowing. RESPIRATORY: No cough, wheezing, SOB CARDIAC: No chest pain, palpitations. No edema. GI: No abdominal pain. No N/V/D or constipation. No heartburn or reflux.  GU: No dysuria, frequency or urgency. No nocturia or change in stream. No change in urine volume or character MUSCULOSKELETAL: Left foot discomfort, intermittent No joint pain, swelling or stiffness. No back pain. No muscle ache, pain, weakness. Gait is unsteady,  uses walker. No recent falls.  NEUROLOGIC: No dizziness, fainting, headache. No change in mental status.  PSYCHIATRIC: No anxiety, depression, behavior issue. Sleeps well.   Physical Exam Filed Vitals:   09/23/12 1138  BP: 104/66  Pulse: 72  Height: 5\' 6"  (1.676 m)  Weight: 149 lb (67.586 kg)   Body mass index is 24.06 kg/(m^2).  GENERAL APPEARANCE: No acute distress, appropriately groomed, normal body habitus. Alert, pleasant, conversant. HEAD: Normocephalic, atraumatic EYES: Conjunctiva/lids clear. Pupils round, reactive. Marland Kitchen  EARS: External exam WNL, Decreased hearing, hearing aides inplace.   NOSE: No deformity or discharge. MOUTH/THROAT: Lips w/o lesions. Oral mucosa, tongue moist, w/o lesion. Oropharynx w/o redness or lesions.  NECK: Supple, full ROM. No thyroid tenderness, enlargement or nodule LYMPHATICS: No head, neck or supraclavicular adenopathy RESPIRATORY: Breathing is even, unlabored. Lung sounds are clear and full.  CARDIOVASCULAR: Heart RRR. No murmur or extra heart sounds  EDEMA: No peripheral or periorbital edema.  GASTROINTESTINAL: Abdomen is soft, non-tender, not distended w/ normal bowel sounds.  MUSCULOSKELETAL: Moves all extremities with full ROM, strength and tone. Back is with mild kyphosis, no spinal process tenderness. Gait is unsteady, stooped - OK with walker NEUROLOGIC: Oriented to time, place, person. Speech clear, no tremor.   PSYCHIATRIC: Mood and affect appropriate to situation  ASSESSMENT/PLAN  Acute systolic heart failure Patient remains without any cough, shortness of breath or peripheral edema. His activity tolerance is unchanged. Most recent labs January 2014 satisfactory. Will decrease dose of Lasix as blood pressure is a bit low and he has no peripheral edema.  Unspecified constipation Patient is satisfied with his bowel regimen, continues to have 2 bowel movements each morning. He is taking MiraLax one quarter dose daily along with a couple  small amounts with his morning cereal. Is not required any use of p.r.n. Senokot.  Unspecified hereditary and idiopathic peripheral neuropathy Left foot discomfort is unchanged, he continues to get up and move his leg around if it is bothersome at night. No further evaluation or intervention required at this time  Unspecified essential hypertension Blood pressure running a bit low, no peripheral edema, decreased Lasix dose. Recheck labs before next appointment.   Lab: 12/17/12  Follow up: 3 months Will give daughter a call today to update medication list.  Kjirsten Bloodgood T.Reginald Mangels, NP-C 09/23/2012

## 2012-09-23 NOTE — Assessment & Plan Note (Signed)
Blood pressure running a bit low, no peripheral edema, decreased Lasix dose. Recheck labs before next appointment.

## 2012-09-23 NOTE — Assessment & Plan Note (Signed)
Patient is satisfied with his bowel regimen, continues to have 2 bowel movements each morning. He is taking MiraLax one quarter dose daily along with a couple small amounts with his morning cereal. Is not required any use of p.r.n. Senokot.

## 2012-10-14 ENCOUNTER — Other Ambulatory Visit: Payer: Self-pay | Admitting: Geriatric Medicine

## 2012-10-14 MED ORDER — ENALAPRIL MALEATE 2.5 MG PO TABS: ORAL_TABLET | ORAL | Status: DC

## 2012-10-15 ENCOUNTER — Other Ambulatory Visit: Payer: Self-pay

## 2012-10-15 MED ORDER — ENALAPRIL MALEATE 2.5 MG PO TABS: ORAL_TABLET | ORAL | Status: DC

## 2012-10-18 ENCOUNTER — Emergency Department (HOSPITAL_COMMUNITY)
Admission: EM | Admit: 2012-10-18 | Discharge: 2012-10-18 | Disposition: A | Discharge status: Home / Self Care | Payer: Medicare Other | Attending: Emergency Medicine | Admitting: Emergency Medicine

## 2012-10-18 ENCOUNTER — Emergency Department (HOSPITAL_COMMUNITY): Payer: Medicare Other

## 2012-10-18 DIAGNOSIS — Z8619 Personal history of other infectious and parasitic diseases: Secondary | ICD-10-CM | POA: Insufficient documentation

## 2012-10-18 DIAGNOSIS — Z8701 Personal history of pneumonia (recurrent): Secondary | ICD-10-CM | POA: Insufficient documentation

## 2012-10-18 DIAGNOSIS — H3530 Unspecified macular degeneration: Secondary | ICD-10-CM | POA: Insufficient documentation

## 2012-10-18 DIAGNOSIS — Y9210 Unspecified residential institution as the place of occurrence of the external cause: Secondary | ICD-10-CM | POA: Insufficient documentation

## 2012-10-18 DIAGNOSIS — Z8739 Personal history of other diseases of the musculoskeletal system and connective tissue: Secondary | ICD-10-CM | POA: Insufficient documentation

## 2012-10-18 DIAGNOSIS — S1093XA Contusion of unspecified part of neck, initial encounter: Secondary | ICD-10-CM | POA: Insufficient documentation

## 2012-10-18 DIAGNOSIS — J449 Chronic obstructive pulmonary disease, unspecified: Secondary | ICD-10-CM | POA: Insufficient documentation

## 2012-10-18 DIAGNOSIS — IMO0002 Reserved for concepts with insufficient information to code with codable children: Secondary | ICD-10-CM | POA: Insufficient documentation

## 2012-10-18 DIAGNOSIS — Z8679 Personal history of other diseases of the circulatory system: Secondary | ICD-10-CM | POA: Insufficient documentation

## 2012-10-18 DIAGNOSIS — J4489 Other specified chronic obstructive pulmonary disease: Secondary | ICD-10-CM | POA: Insufficient documentation

## 2012-10-18 DIAGNOSIS — S0003XA Contusion of scalp, initial encounter: Secondary | ICD-10-CM | POA: Insufficient documentation

## 2012-10-18 DIAGNOSIS — Z87891 Personal history of nicotine dependence: Secondary | ICD-10-CM | POA: Insufficient documentation

## 2012-10-18 DIAGNOSIS — Z79899 Other long term (current) drug therapy: Secondary | ICD-10-CM | POA: Insufficient documentation

## 2012-10-18 DIAGNOSIS — Z8669 Personal history of other diseases of the nervous system and sense organs: Secondary | ICD-10-CM | POA: Insufficient documentation

## 2012-10-18 DIAGNOSIS — I509 Heart failure, unspecified: Secondary | ICD-10-CM | POA: Insufficient documentation

## 2012-10-18 DIAGNOSIS — W1809XA Striking against other object with subsequent fall, initial encounter: Secondary | ICD-10-CM | POA: Insufficient documentation

## 2012-10-18 DIAGNOSIS — D649 Anemia, unspecified: Secondary | ICD-10-CM | POA: Insufficient documentation

## 2012-10-18 DIAGNOSIS — Z872 Personal history of diseases of the skin and subcutaneous tissue: Secondary | ICD-10-CM | POA: Insufficient documentation

## 2012-10-18 DIAGNOSIS — H259 Unspecified age-related cataract: Secondary | ICD-10-CM | POA: Insufficient documentation

## 2012-10-18 DIAGNOSIS — Z7982 Long term (current) use of aspirin: Secondary | ICD-10-CM | POA: Insufficient documentation

## 2012-10-18 DIAGNOSIS — S0001XA Abrasion of scalp, initial encounter: Secondary | ICD-10-CM

## 2012-10-18 DIAGNOSIS — I252 Old myocardial infarction: Secondary | ICD-10-CM | POA: Insufficient documentation

## 2012-10-18 DIAGNOSIS — I2510 Atherosclerotic heart disease of native coronary artery without angina pectoris: Secondary | ICD-10-CM | POA: Insufficient documentation

## 2012-10-18 DIAGNOSIS — Y9301 Activity, walking, marching and hiking: Secondary | ICD-10-CM | POA: Insufficient documentation

## 2012-10-18 DIAGNOSIS — M1610 Unilateral primary osteoarthritis, unspecified hip: Secondary | ICD-10-CM | POA: Insufficient documentation

## 2012-10-18 DIAGNOSIS — I10 Essential (primary) hypertension: Secondary | ICD-10-CM | POA: Insufficient documentation

## 2012-10-18 DIAGNOSIS — Z8673 Personal history of transient ischemic attack (TIA), and cerebral infarction without residual deficits: Secondary | ICD-10-CM | POA: Insufficient documentation

## 2012-10-18 DIAGNOSIS — S0990XA Unspecified injury of head, initial encounter: Secondary | ICD-10-CM | POA: Insufficient documentation

## 2012-10-18 DIAGNOSIS — Z8719 Personal history of other diseases of the digestive system: Secondary | ICD-10-CM | POA: Insufficient documentation

## 2012-10-18 DIAGNOSIS — Z8674 Personal history of sudden cardiac arrest: Secondary | ICD-10-CM | POA: Insufficient documentation

## 2012-10-18 DIAGNOSIS — N400 Enlarged prostate without lower urinary tract symptoms: Secondary | ICD-10-CM | POA: Insufficient documentation

## 2012-10-18 DIAGNOSIS — M169 Osteoarthritis of hip, unspecified: Secondary | ICD-10-CM | POA: Insufficient documentation

## 2012-10-18 DIAGNOSIS — M129 Arthropathy, unspecified: Secondary | ICD-10-CM | POA: Insufficient documentation

## 2012-10-18 DIAGNOSIS — Z8781 Personal history of (healed) traumatic fracture: Secondary | ICD-10-CM | POA: Insufficient documentation

## 2012-10-18 NOTE — ED Notes (Signed)
Per ems: pt from [facility], was walking outside around 1730 today and tripped over curb. Fell and hit back of head- hematoma present on left side of head. No LOC. EMS called initially, pt refused transport. Called again later and pt agreed to come in. Denies n/v. Bleeding controled. bp 150/80, pulse 76, respirations 16. DNR.

## 2012-10-18 NOTE — ED Notes (Signed)
[id number]<QM> Expected date:<BR> Expected time:<BR> Means of arrival:<BR> Comments:<BR> Elderly hematoma to head, fall

## 2012-10-18 NOTE — ED Provider Notes (Signed)
History     CSN: [id number]  Arrival date & time 10/18/12  1846   First MD Initiated Contact with Patient 10/18/12 2004      Chief Complaint  Patient presents with  . Fall    (Consider location/radiation/quality/duration/timing/severity/associated sxs/prior treatment) The history is provided by the patient and the spouse.  CALDWELL KRONENBERGER is a 79 y.o. male history of A. fib not on Coumadin, BPH here with fall. He was walking and tripped over the curb and fell and hit the back of his head around 5:30 PM. He had a scalp laceration afterwards. He initially didn't want to come but later agreed to come to ER to be checked. Tetanus up to date. No syncope. Patient DNR.    Past Medical History  Diagnosis Date  . Atrial fibrillation     Not on Coumadin due to recurrent, severe GIB  . H/O: GI bleed   . Inguinal hernia   . Spinal stenosis     S/p surgery   . MI, old 53  . Diverticulitis   . BPH (benign prostatic hyperplasia)   . Anemia   . Arthritis   . Blood transfusion   . CHF (congestive heart failure)   . Hypertension   . Heart attack 1976  . Unspecified hereditary and idiopathic peripheral neuropathy 03/25/2012  . Corns and callosities 12/18/2011  . Pain in joint, ankle and foot 11/20/2011  . Chronic airway obstruction, not elsewhere classified 07/17/2011  . Other dyspnea and respiratory abnormality 07/17/2011  . Other specified cardiac dysrhythmias(427.89) 06/04/2011  . Typhoid fever 05/29/2011  . Macular degeneration (senile) of retina, unspecified 05/29/2011  . Senile cataract, unspecified 05/29/2011  . Pericarditis, acute 05/29/2011  . Acute systolic heart failure 05/29/2011  . Unspecified venous (peripheral) insufficiency 05/29/2011  . Duodenal ulcer 05/29/2011  . Osteoarthrosis, unspecified whether generalized or localized, pelvic region and thigh 05/29/2011  . Other and unspecified disc disorder of lumbar region 05/29/2011  . Spasm of muscle 05/29/2011  . Abnormality of gait 05/29/2011  .  Closed fracture of pubis 05/29/2011  . Transient ischemic attack (TIA), and cerebral infarction without residual deficits(V12.54) 2013  . Coronary atherosclerosis of native coronary artery 2013  . Pneumonia, organism unspecified 2013  . Unspecified constipation 2013  . Debility, unspecified 05/24/2011  . Unspecified essential hypertension 09/23/2012    Past Surgical History  Procedure Laterality Date  . Spine surgery      Spinal stenosis surgery with Dr. Darrelyn Hillock   . Cholecystectomy    . Total hip arthroplasty  2002    Right   . Vein surgery  2007    leg  . Inguinal hernia repair  2003    RIH  . Appendectomy    . Tonsillectomy and adenoidectomy      Family History  Problem Relation Age of Onset  . Heart attack Father   . Heart disease Father   . Rheumatologic disease Mother     History  Substance Use Topics  . Smoking status: Former Smoker    Quit date: 05/20/1974  . Smokeless tobacco: Never Used  . Alcohol Use: Yes     Comment: 3 oz daily       Review of Systems  Skin: Positive for wound.  All other systems reviewed and are negative.    Allergies  Metoprolol  Home Medications   Current Outpatient Rx  Name  Route  Sig  Dispense  Refill  . amLODipine (NORVASC) 5 MG tablet  take 1 tablet by mouth once daily   30 tablet   11   . aspirin 81 MG tablet   Oral   Take 81 mg by mouth 3 (three) times a week.           . beta carotene w/minerals (OCUVITE) tablet   Oral   Take 1 tablet by mouth daily. For eyes         . doxepin (SINEQUAN) 25 MG capsule   Oral   Take 1 capsule (25 mg total) by mouth at bedtime.   90 capsule   1   . enalapril (VASOTEC) 2.5 MG tablet   Oral   Take 2.5 mg by mouth daily.         . enalapril (VASOTEC) 2.5 MG tablet      Take one tablet once a day to treat heart failure   30 tablet   5   . fish oil-omega-3 fatty acids 1000 MG capsule   Oral   Take by mouth daily.           . furosemide (LASIX) 40 MG tablet    Oral   Take 1 tablet (40 mg total) by mouth daily. Take 1 tablet by mouth every other day   30 tablet   11   . Multiple Vitamin (MULTIVITAMIN) tablet   Oral   Take 1 tablet by mouth daily.           . NON FORMULARY      as needed. oxygen         . polyethylene glycol (MIRALAX / GLYCOLAX) packet   Oral   Take 17 g by mouth daily. Mix in 6 oz beverage in morin ing to relieve constipation         . senna-docusate (SENOKOT S) 8.6-50 MG per tablet   Oral   Take 2 tablets by mouth. Nightly for stool softener and laxative         . Tamsulosin HCl (FLOMAX) 0.4 MG CAPS   Oral   Take by mouth.           BP 154/74  Temp(Src) 97.3 F (36.3 C) (Oral)  Resp 17  SpO2 96%  Physical Exam  Nursing note and vitals reviewed. Constitutional: He is oriented to person, place, and time. He appears well-developed and well-nourished.  Old, but well appearing   HENT:  Head: Normocephalic.  Mouth/Throat: Oropharynx is clear and moist.  Small posterior scalp abrasion with mild hematoma   Eyes: Conjunctivae are normal. Pupils are equal, round, and reactive to light.  Neck: Normal range of motion. Neck supple.  Cardiovascular: Normal rate, regular rhythm and normal heart sounds.   Pulmonary/Chest: Breath sounds normal. No respiratory distress. He has no wheezes. He has no rales.  Abdominal: Soft. Bowel sounds are normal. He exhibits no distension. There is no tenderness. There is no rebound.  Musculoskeletal: Normal range of motion.  Neurological: He is alert and oriented to person, place, and time. No cranial nerve deficit. Coordination normal.  Skin: Skin is warm and dry.  Psychiatric: He has a normal mood and affect. His behavior is normal. Judgment and thought content normal.    ED Course  Procedures (including critical care time)  Labs Reviewed - No data to display Ct Head Wo Contrast  10/18/2012   *RADIOLOGY REPORT*  Clinical Data:  Fall, posterior head abrasion, neck  stiffness  CT HEAD WITHOUT CONTRAST CT CERVICAL SPINE WITHOUT CONTRAST  Technique:  Multidetector CT imaging of  the head and cervical spine was performed following the standard protocol without intravenous contrast.  Multiplanar CT image reconstructions of the cervical spine were also generated.  Comparison:  CT head 05/14/2011, cervical spine radiographs 08/31/2007  CT HEAD  Findings: Cortical volume loss noted with proportional ventricular prominence.  Periventricular white matter hypodensity likely indicates small vessel ischemic change.  No acute hemorrhage, acute infarction, or mass lesion is identified.  Left posterior occipital scalp swelling is present.  No underlying skull fracture.  Orbits and paranasal sinuses are intact.  IMPRESSION: No acute intracranial finding.  Chronic findings as above.  Left posterior occipital scalp swelling.  CT CERVICAL SPINE  Findings: C1 through the cervical thoracic junction is visualized in its entirety. No precervical soft tissue widening is present. Multilevel disc degenerative change with Schmorl's node formation noted and minimal associated superior endplate compression deformity at C7, unchanged when allowing for differences in technique since the prior exam.  Disc degenerative change is noted at C5-6 and C3-C4.  Multilevel facet osteoarthritic change present.  IMPRESSION: No acute osseous abnormality of the cervical spine.  Schmorl's node formation and disc degenerative change as above.   Original Report Authenticated By: Christiana Pellant, M.D.   Ct Cervical Spine Wo Contrast  10/18/2012   *RADIOLOGY REPORT*  Clinical Data:  Fall, posterior head abrasion, neck stiffness  CT HEAD WITHOUT CONTRAST CT CERVICAL SPINE WITHOUT CONTRAST  Technique:  Multidetector CT imaging of the head and cervical spine was performed following the standard protocol without intravenous contrast.  Multiplanar CT image reconstructions of the cervical spine were also generated.  Comparison:  CT  head 05/14/2011, cervical spine radiographs 08/31/2007  CT HEAD  Findings: Cortical volume loss noted with proportional ventricular prominence.  Periventricular white matter hypodensity likely indicates small vessel ischemic change.  No acute hemorrhage, acute infarction, or mass lesion is identified.  Left posterior occipital scalp swelling is present.  No underlying skull fracture.  Orbits and paranasal sinuses are intact.  IMPRESSION: No acute intracranial finding.  Chronic findings as above.  Left posterior occipital scalp swelling.  CT CERVICAL SPINE  Findings: C1 through the cervical thoracic junction is visualized in its entirety. No precervical soft tissue widening is present. Multilevel disc degenerative change with Schmorl's node formation noted and minimal associated superior endplate compression deformity at C7, unchanged when allowing for differences in technique since the prior exam.  Disc degenerative change is noted at C5-6 and C3-C4.  Multilevel facet osteoarthritic change present.  IMPRESSION: No acute osseous abnormality of the cervical spine.  Schmorl's node formation and disc degenerative change as above.   Original Report Authenticated By: Christiana Pellant, M.D.     No diagnosis found.    MDM  DAXTER PAULE is a 79 y.o. male here with s/p fall and scalp laceration. Will get CT head/neck.   9:36 PM CT head and neck unremarkable. I cleaned the wound and there is a small puncture wound in the middle of the abrasion. No active bleeding. I applied bacitracin and placed bandaid. Will f/u outpatient.         Richardean Canal, MD 10/18/12 2137

## 2012-11-03 ENCOUNTER — Ambulatory Visit (INDEPENDENT_AMBULATORY_CARE_PROVIDER_SITE_OTHER): Payer: Medicare Other | Admitting: Cardiology

## 2012-11-03 ENCOUNTER — Encounter: Payer: Self-pay | Admitting: Cardiology

## 2012-11-03 ENCOUNTER — Telehealth: Payer: Self-pay | Admitting: Cardiology

## 2012-11-03 VITALS — BP 118/70 | HR 72 | Ht 66.0 in | Wt 149.0 lb

## 2012-11-03 DIAGNOSIS — I10 Essential (primary) hypertension: Secondary | ICD-10-CM

## 2012-11-03 DIAGNOSIS — I5021 Acute systolic (congestive) heart failure: Secondary | ICD-10-CM

## 2012-11-03 DIAGNOSIS — I4891 Unspecified atrial fibrillation: Secondary | ICD-10-CM

## 2012-11-03 DIAGNOSIS — I4892 Unspecified atrial flutter: Secondary | ICD-10-CM

## 2012-11-03 DIAGNOSIS — J189 Pneumonia, unspecified organism: Secondary | ICD-10-CM

## 2012-11-03 MED ORDER — FUROSEMIDE 20 MG PO TABS: 20.0000 mg | ORAL_TABLET | Freq: Every day | ORAL | Status: DC

## 2012-11-03 NOTE — Telephone Encounter (Signed)
The Pharmacist wants to clarify the prescription for Lasix. According to pt's instructions; a prescription was send to the [facility] pharmacy for lasix 20 mg once a day. Doug Pharmacist aware.

## 2012-11-03 NOTE — Assessment & Plan Note (Signed)
Blood pressure has been remaining stable.  He has been taking Lasix 40 mg every other day.  He does have some difficulty with bladder control and we will change the timing of his Lasix to 20 mg daily.

## 2012-11-03 NOTE — Patient Instructions (Signed)
Your physician has recommended you make the following change in your medication:  CHANGE TO LASIX 20 MG DAILY NOW  Your physician wants you to follow-up in: 4 MONTHS  You will receive a reminder letter in the mail two months in advance. If you don't receive a letter, please call our office to schedule the follow-up appointment.

## 2012-11-03 NOTE — Assessment & Plan Note (Signed)
The patient had a prior history of community acquired pneumonia.  When he first went home from the hospital the truck home oxygen but has been able to send his home oxygen back to the supplier.  He no longer feels that he needs it.

## 2012-11-03 NOTE — Assessment & Plan Note (Signed)
Patient remains in atrial fibrillation with controlled ventricular response.  He is not on Coumadin because of previous problems with GI bleeding.  He has not been having any TIA or stroke symptoms.

## 2012-11-03 NOTE — Progress Notes (Signed)
[name] Date of Birth:  Jan 02, 1912 [facility] [address] [place], [place]  [address] [phone]         Fax   [phone]  History of Present Illness: This pleasant 79 year old gentleman is seen for a four-month followup office visit. He has a history of paroxysmal atrial fibrillation. He was hospitalized over Christmas 2012 for 10 days at [facility] because of acute pneumonia. He did not require intubation but was seriously ill with problems of disorientation and confusion while in the intensive care unit. He also was in atrial flutter with a rapid ventricular response. He has a past history of paroxysmal atrial fib but is not on Coumadin because of his age and because of the previous history of recurrent GI bleeds. The patient has not been having any hematochezia or melena. He recently saw his gastroenterologist Dr. Matthias Hughs who had him increase his MiraLax from a standard dose to 1-1/4 doses daily and since then the patient's bowel movements have been doing well.  Overall the patient has been doing well.  He has had some pain in his left foot and has an appointment with a foot doctor in the near future.  He has a remote history of spinal stenosis surgery in 2009.  He had spinal stenosis at L4-L5 and Dr. Darrelyn Hillock is his orthopedist   Current Outpatient Prescriptions  Medication Sig Dispense Refill  . amLODipine (NORVASC) 5 MG tablet take 1 tablet by mouth once daily  30 tablet  11  . aspirin 81 MG tablet Take 81 mg by mouth 3 (three) times a week.        . beta carotene w/minerals (OCUVITE) tablet Take 1 tablet by mouth daily. For eyes      . doxepin (SINEQUAN) 25 MG capsule Take 1 capsule (25 mg total) by mouth at bedtime.  90 capsule  1  . enalapril (VASOTEC) 2.5 MG tablet Take one tablet once a day to treat heart failure  30 tablet  5  . fish oil-omega-3 fatty acids 1000 MG capsule Take by mouth daily.        . furosemide (LASIX) 20 MG tablet  Take 1 tablet (20 mg total) by mouth daily.  90 tablet  1  . Multiple Vitamin (MULTIVITAMIN) tablet Take 1 tablet by mouth daily.        . NON FORMULARY as needed. oxygen      . polyethylene glycol (MIRALAX / GLYCOLAX) packet Take 17 g by mouth daily. Mix in 6 oz beverage in morin ing to relieve constipation      . senna-docusate (SENOKOT S) 8.6-50 MG per tablet Take 2 tablets by mouth. Nightly for stool softener and laxative      . sodium chloride (MURO 128) 2 % ophthalmic solution Place 1 drop into the left eye 2 (two) times daily.      . Tamsulosin HCl (FLOMAX) 0.4 MG CAPS Take by mouth.      . [DISCONTINUED] levalbuterol (XOPENEX HFA) 45 MCG/ACT inhaler Inhale 1-2 puffs into the lungs every 6 (six) hours as needed for wheezing.  1 Inhaler    . [DISCONTINUED] metoprolol tartrate (LOPRESSOR) 25 MG tablet Take 1 tablet (25 mg total) by mouth 2 (two) times daily.      . [DISCONTINUED] nitroGLYCERIN (NITROSTAT) 0.4 MG SL tablet Place 0.4 mg under the tongue every 5 (five) minutes as needed.         No current facility-administered medications for this visit.  Allergies  Allergen Reactions  . Metoprolol     Patient Active Problem List   Diagnosis Date Noted  . Acute systolic heart failure 09/23/2012  . Unspecified constipation 09/23/2012  . Unspecified hereditary and idiopathic peripheral neuropathy 09/23/2012  . Unspecified essential hypertension 09/23/2012  . Atrial flutter 07/01/2012  . Diverticulosis of colon (without mention of hemorrhage) 07/01/2012  . PAF (paroxysmal atrial fibrillation) 10/24/2011  . Acute interstitial pneumonia 05/29/2011  . Hypokalemia 05/16/2011  . Influenza A 05/15/2011  . Encephalopathy 05/15/2011  . Thrombocytopenia 05/15/2011  . Community acquired pneumonia 05/14/2011  . NSTEMI (non-ST elevated myocardial infarction) 05/14/2011  . Inguinal hernia, left. 04/17/2011  . Atrial fibrillation 10/24/2010  . Benign hypertensive heart disease without heart  failure 10/24/2010  . Chronic ischemic heart disease 10/24/2010    History  Smoking status  . Former Smoker  . Quit date: 05/20/1974  Smokeless tobacco  . Never Used    History  Alcohol Use  . Yes    Comment: 3 oz daily     Family History  Problem Relation Age of Onset  . Heart attack Father   . Heart disease Father   . Rheumatologic disease Mother     Review of Systems: Constitutional: no fever chills diaphoresis or fatigue or change in weight.  Head and neck: no hearing loss, no epistaxis, no photophobia or visual disturbance. Respiratory: No cough, shortness of breath or wheezing. Cardiovascular: No chest pain peripheral edema, palpitations. Gastrointestinal: No abdominal distention, no abdominal pain, no change in bowel habits hematochezia or melena. Genitourinary: No dysuria, no frequency, no urgency, no nocturia. Musculoskeletal:No arthralgias, no back pain, no gait disturbance or myalgias. Neurological: No dizziness, no headaches, no numbness, no seizures, no syncope, no weakness, no tremors. Hematologic: No lymphadenopathy, no easy bruising. Psychiatric: No confusion, no hallucinations, no sleep disturbance.    Physical Exam: Filed Vitals:   11/03/12 1029  BP: 118/70  Pulse: 72   the general appearance reveals a well-developed well-nourished very elderly gentleman in no distress.  He is still very mentally alert.The head and neck exam reveals pupils equal and reactive.  Extraocular movements are full.  There is no scleral icterus.  The mouth and pharynx are normal.  The neck is supple.  The carotids reveal no bruits.  The jugular venous pressure is normal.  The  thyroid is not enlarged.  There is no lymphadenopathy.  The chest is clear to percussion and auscultation.  There are no rales or rhonchi.  Expansion of the chest is symmetrical.  The precordium is quiet.  The first heart sound is normal.  The second heart sound is physiologically split.  The pulse is  irregularly irregular  There is no murmur gallop rub or click.  There is no abnormal lift or heave.  The abdomen is soft and nontender.  The bowel sounds are normal.  The liver and spleen are not enlarged.  There are no abdominal masses.  There are no abdominal bruits.  Extremities reveal good pedal pulses.  There is no phlebitis or edema.  There is no cyanosis or clubbing.  Strength is normal and symmetrical in all extremities.  There is no lateralizing weakness.  There are no sensory deficits.  The skin is warm and dry.  There is no rash.     Assessment / Plan: Continue on same medication.  Switch Lasix to take 20 mg daily.  Recheck in 4 months for followup office visit and EKG.

## 2012-11-03 NOTE — Telephone Encounter (Signed)
New Problem  [name] has a question regarding the prescription for LASIX. He said they have received 2 prescriptions, one says take one a day and the other script says take on every other day. He needs clarification.

## 2012-11-06 ENCOUNTER — Other Ambulatory Visit: Payer: Self-pay | Admitting: Nurse Practitioner

## 2012-11-10 ENCOUNTER — Other Ambulatory Visit: Payer: Self-pay | Admitting: *Deleted

## 2012-11-10 MED ORDER — TAMSULOSIN HCL 0.4 MG PO CAPS: ORAL_CAPSULE | ORAL | Status: DC

## 2012-12-08 ENCOUNTER — Other Ambulatory Visit: Payer: Self-pay

## 2012-12-08 DIAGNOSIS — F329 Major depressive disorder, single episode, unspecified: Secondary | ICD-10-CM

## 2012-12-08 MED ORDER — DOXEPIN HCL 25 MG PO CAPS: 25.0000 mg | ORAL_CAPSULE | Freq: Every day | ORAL | Status: DC

## 2012-12-23 ENCOUNTER — Encounter: Payer: Self-pay | Admitting: Geriatric Medicine

## 2012-12-23 ENCOUNTER — Other Ambulatory Visit: Payer: Self-pay

## 2012-12-23 ENCOUNTER — Non-Acute Institutional Stay: Payer: Medicare Other | Admitting: Geriatric Medicine

## 2012-12-23 VITALS — BP 128/70 | HR 60 | Ht 66.0 in | Wt 141.0 lb

## 2012-12-23 DIAGNOSIS — I5021 Acute systolic (congestive) heart failure: Secondary | ICD-10-CM

## 2012-12-23 DIAGNOSIS — K5900 Constipation, unspecified: Secondary | ICD-10-CM

## 2012-12-23 DIAGNOSIS — Z66 Do not resuscitate: Secondary | ICD-10-CM

## 2012-12-23 DIAGNOSIS — I10 Essential (primary) hypertension: Secondary | ICD-10-CM

## 2012-12-23 NOTE — Progress Notes (Signed)
Patient ID: [name], male   DOB: 1911/06/14, 79 y.o.   MRN: [id number]  Chief Complaint  Patient presents with  . Medical Managment of Chronic Issues    blood pressure, A-Fib. systolic heart failure   Code Status: Living Will, DNR  Contact Information   Name Relation Home Work Mobile   [place] Daughter [phone]  [phone]   [name] [phone]  [phone]     HPI: This is a 79 y.o. male resident of [facility], Independent Living section evaluated today for management of ongoing medical issues.  Daughter [name] usually accompanies him to his office visit, she is here today. Patient reports he has no troubles, is eating okay, sleeping well. He continues to exercise daily, socializes at meals in the dining room. Visits with his family often. He denies any pain except left foot remains sore, there is no change in this foot. He is using a walker at all times. Did have a fall in June where he was sitting on the walker and could not get off,  fell on the ground, hit his head. Was evaluated in Emergency Department including an MRI. Did not reveal any acute problems, pt. Had no headache, dizziness or change in mental status. Patient also had a tooth extraction done in June 2014, uncomplicated.  Recent lab studies satisfactory.  Allergies  Allergies  Allergen Reactions  . Metoprolol    Medications  Reviewed Data Reviewed        NUU:VOZDGUY, external 12/10/2011 BMP: Sodium 137, Potassium 4.2, glucose 88, BUN 31, Creatinine 1.18 06/16/2012 CBC: Wbc 4.1, Rbc 3.95, Hct 38.8, Platelet 119 ,Plt 119 CMP: Sodium 136, Potassium 4.6, glucose 87, BUN 35, Creatinine 1.30 protein/LFTs WNL  GFR 49.5 TSH 3.224  12/17/2012 glucose 94, BUN 26, creatinine 1.16, sodium 133, potassium 4.4      Review of Systems   DATA OBTAINED: from patient, daughter GENERAL: Feels well. No fevers, fatigue, change in activity status. No change in appetite, weight . SKIN:  No itch, rash or open wounds EYES: No eye pain, dryness or itching. No change in vision EARS: No earache, change in hearing NOSE: No congestion, drainage or bleeding MOUTH/THROAT: No mouth or tooth pain. No difficulty chewing or swallowing. Takes pills with applesauce RESPIRATORY: No cough, wheezing, SOB CARDIAC: No chest pain, palpitations. No edema. GI: No abdominal pain. No N/V/D or constipation. No heartburn or reflux.  GU: No dysuria, frequency or urgency. Nocturia x2-3 (unchanged) No change in stream. No change in urine volume or character MUSCULOSKELETAL: Left foot discomfort, intermittent No joint pain, swelling or stiffness. No back pain. No muscle ache, pain, weakness. Gait is unsteady, uses walker. No recent falls.  NEUROLOGIC: No dizziness, fainting, headache. No change in mental status. Does notice slow retrieval of information (names, dates) PSYCHIATRIC: No anxiety, depression, behavior issue. Sleeps well.   Physical Exam Filed Vitals:   12/23/12 1021  BP: 128/70  Pulse: 60  Height: 5\' 6"  (1.676 m)  Weight: 141 lb (63.957 kg)   Body mass index is 22.77 kg/(m^2).  GENERAL APPEARANCE: No acute distress, appropriately groomed, normal body habitus. Alert, pleasant, conversant. HEAD: Normocephalic, atraumatic EYES: Conjunctiva/lids clear. Pupils round, reactive. Marland Kitchen  EARS: External exam WNL, Decreased hearing, hearing aides inplace.   NOSE: No deformity or discharge. MOUTH/THROAT: Lips w/o lesions. Oral mucosa, tongue moist, w/o lesion. Oropharynx w/o redness or lesions.  NECK: Supple, full ROM. No thyroid tenderness, enlargement or nodule LYMPHATICS: No head, neck or supraclavicular adenopathy RESPIRATORY: Breathing is even,  unlabored. Lung sounds are clear and full.  CARDIOVASCULAR: Heart RRR. No murmur or extra heart sounds  EDEMA: No peripheral or periorbital edema.  GASTROINTESTINAL: Abdomen is soft, non-tender, not distended w/ normal bowel sounds.  MUSCULOSKELETAL:  Moves all extremities with full ROM, strength and tone. Back is with mild kyphosis, no spinal process tenderness. Gait is unsteady, stooped - OK with walker NEUROLOGIC: Oriented to time, place, person. Speech clear, no tremor.   PSYCHIATRIC: Mood and affect appropriate to situation  ASSESSMENT/PLAN  Acute systolic heart failure Patient remains without any signs of heart failure. Tolerating lower dose of diuretics without any peripheral edema, lab with mildly low sodium, otherwise within normal limits.  Unspecified constipation Symptoms are stable, having regular bowel movements with current dose of MiraLax.  Unspecified essential hypertension Blood pressure stable on current medications, laboratory studies satisfactory.   Follow up: 3 months  Cayli Escajeda T.Sujey Gundry, NP-C 12/23/2012

## 2012-12-23 NOTE — Assessment & Plan Note (Signed)
Symptoms are stable, having regular bowel movements with current dose of MiraLax.

## 2012-12-23 NOTE — Assessment & Plan Note (Signed)
Patient remains without any signs of heart failure. Tolerating lower dose of diuretics without any peripheral edema, lab with mildly low sodium, otherwise within normal limits.

## 2012-12-23 NOTE — Assessment & Plan Note (Addendum)
Blood pressure stable on current medications, laboratory studies satisfactory.

## 2013-01-06 ENCOUNTER — Encounter: Payer: Self-pay | Admitting: Internal Medicine

## 2013-01-08 ENCOUNTER — Other Ambulatory Visit: Payer: Self-pay | Admitting: *Deleted

## 2013-01-08 MED ORDER — AMLODIPINE BESYLATE 5 MG PO TABS: ORAL_TABLET | ORAL | Status: DC

## 2013-03-02 DIAGNOSIS — S81809A Unspecified open wound, unspecified lower leg, initial encounter: Secondary | ICD-10-CM | POA: Insufficient documentation

## 2013-03-03 ENCOUNTER — Encounter: Payer: Self-pay | Admitting: Geriatric Medicine

## 2013-03-03 ENCOUNTER — Non-Acute Institutional Stay: Payer: Medicare Other | Admitting: Geriatric Medicine

## 2013-03-03 VITALS — BP 136/68 | HR 76 | Temp 96.2°F | Ht 68.0 in | Wt 146.0 lb

## 2013-03-03 DIAGNOSIS — L84 Corns and callosities: Secondary | ICD-10-CM

## 2013-03-03 DIAGNOSIS — L0291 Cutaneous abscess, unspecified: Secondary | ICD-10-CM

## 2013-03-03 DIAGNOSIS — L0390 Cellulitis, unspecified: Secondary | ICD-10-CM | POA: Insufficient documentation

## 2013-03-03 MED ORDER — CEPHALEXIN 500 MG PO CAPS: 500.0000 mg | ORAL_CAPSULE | Freq: Two times a day (BID) | ORAL | Status: DC

## 2013-03-03 NOTE — Progress Notes (Signed)
Patient ID: [name], male   DOB: 01-Jun-1911, 79 y.o.   MRN: [id number]  Chief Complaint  Patient presents with  . Wound Check    wound on left lower leg. He closed car door on leg 10 days ago   Code Status: Living Will, DNR  Contact Information   Name Relation Home Work Mobile   [place] Daughter [phone]  [phone]   [name] [phone]  [phone]     HPI: This is a 79 y.o. male resident of [facility], Independent Living section evaluated today due to worsening leg wound. Approximately 10 days ago the patient sustained a superficial wound to his left lower leg while closing a car door. Clinic nurse has been changing the dressing every few days and noted 2days ago the wound was not healing well and patient was having more pain in his leg. Patient also complains of increased pain in the bottom of his left foot.  Allergies  Allergies  Allergen Reactions  . Metoprolol    Medications     Medication List       This list is accurate as of: 03/03/13 12:49 PM.  Always use your most recent med list.               amLODipine 5 MG tablet  Commonly known as:  NORVASC  take 1 tablet by mouth once daily     aspirin 81 MG tablet  Take 81 mg by mouth 3 (three) times a week.     beta carotene w/minerals tablet  Take 1 tablet by mouth daily. For eyes     cephALEXin 500 MG capsule  Commonly known as:  KEFLEX  Take 1 capsule (500 mg total) by mouth 2 (two) times daily.     doxepin 25 MG capsule  Commonly known as:  SINEQUAN  Take 1 capsule (25 mg total) by mouth at bedtime.     enalapril 2.5 MG tablet  Commonly known as:  VASOTEC  Take one tablet once a day to treat heart failure     fish oil-omega-3 fatty acids 1000 MG capsule  Take by mouth daily.     furosemide 20 MG tablet  Commonly known as:  LASIX  Take 1 tablet (20 mg total) by mouth daily.     multivitamin tablet  Take 1 tablet by mouth daily.     NON FORMULARY   as needed. oxygen     polyethylene glycol packet  Commonly known as:  MIRALAX / GLYCOLAX  Take 17 g by mouth daily. Mix in 6 oz beverage in morin ing to relieve constipation     SENOKOT S 8.6-50 MG per tablet  Generic drug:  senna-docusate  Take 2 tablets by mouth. Nightly for stool softener and laxative, as needed     sodium chloride 2 % ophthalmic solution  Commonly known as:  MURO 128  Place 1 drop into the left eye 2 (two) times daily.     tamsulosin 0.4 MG Caps capsule  Commonly known as:  FLOMAX  Take one capsule by mouth once daily        Data Reviewed        ZDG:LOVFIEP, external 12/10/2011 BMP: Sodium 137, Potassium 4.2, glucose 88, BUN 31, Creatinine 1.18 06/16/2012 CBC: Wbc 4.1, Rbc 3.95, Hct 38.8, Platelet 119 ,Plt 119 CMP: Sodium 136, Potassium 4.6, glucose 87, BUN 35, Creatinine 1.30 protein/LFTs WNL  GFR 49.5 TSH 3.224  12/17/2012 glucose 94, BUN 26, creatinine 1.16, sodium 133, potassium  4.4      Review of Systems   DATA OBTAINED: from patient, daughter GENERAL: Feels well. No fevers, fatigue, change in activity status. No change in appetite, weight . RESPIRATORY: No cough, wheezing, SOB CARDIAC: No chest pain, palpitations. No edema. GI: No abdominal pain. No N/V/D or constipation. No heartburn or reflux.  MUSCULOSKELETAL: Left foot discomfort is worse lately. Left lower leg is very soret No joint pain, swelling or stiffness. No back pain. No muscle ache, pain, weakness. Gait is unsteady, uses walker. No recent falls.  NEUROLOGIC: No dizziness, fainting, headache. No change in mental status. Does notice slow retrieval of information (names, dates) PSYCHIATRIC: No anxiety, depression, behavior issue. Sleeps well.   Physical Exam Filed Vitals:   03/03/13 1141  BP: 136/68  Pulse: 76  Temp: 96.2 F (35.7 C)  TempSrc: Oral  Height: 5\' 8"  (1.727 m)  Weight: 146 lb (66.225 kg)   Body mass index is 22.2 kg/(m^2).  GENERAL APPEARANCE: No acute  distress, appropriately groomed, normal body habitus. Alert, pleasant, conversant. SKIN: Left lateral lower leg with superficial open wound approximately 6 cm x 2 cm in size. Portion of the wound edges are black and dry, wound base is deep red, ? Intermixed with slough. Skin surrounding this wound anterior lower leg very warm, tender, swollen. Cleansed with normal saline, attempted some debridement but it is too painful today. Covered with Mepitel, gauze and Hypafix tape. Bottom of left foot with large callus at base of first metatarsal head and between metatarsal heads of fourth and fifth toes. These calluses and not new, have been shaved down in the past. She each callus down to even with the skin, no sign of ulceration. Covered each with Mepilex dressing. HEAD: Normocephalic, atraumatic .  RESPIRATORY: Breathing is even, unlabored.   EDEMA: No peripheral  edema.  MUSCULOSKELETAL: Moves all extremities with full ROM, strength and tone. Back is with mild kyphosis, no spinal process tenderness. Gait is unsteady, stooped - OK with walker NEUROLOGIC: Oriented to time, place, person. Speech clear, no tremor.   PSYCHIATRIC: Mood and affect appropriate to situation  ASSESSMENT/PLAN  Cellulitis Superficial wound of left lower leg, nonhealing no evidence of surrounding cellulitis. Treat with p.o. Keflex. Clinic nurse will change the dressing on Friday, I will see him again next week  Callus of foot Calluses of left foot very bothersome to this patient, causing significant discomfort with walking. After shaving ulcers today, patient reports his foot feels very comfortable.   Follow up: 1 week  Rumi Kolodziej T.Annalisse Minkoff, NP-C 03/03/2013

## 2013-03-03 NOTE — Assessment & Plan Note (Signed)
Calluses of left foot very bothersome to this patient, causing significant discomfort with walking. After shaving ulcers today, patient reports his foot feels very comfortable.

## 2013-03-03 NOTE — Assessment & Plan Note (Signed)
Superficial wound of left lower leg, nonhealing no evidence of surrounding cellulitis. Treat with p.o. Keflex. Clinic nurse will change the dressing on Friday, I will see him again next week

## 2013-03-10 ENCOUNTER — Encounter: Payer: Self-pay | Admitting: Geriatric Medicine

## 2013-03-10 ENCOUNTER — Non-Acute Institutional Stay: Payer: Medicare Other | Admitting: Geriatric Medicine

## 2013-03-10 VITALS — BP 118/64 | HR 64 | Temp 95.8°F | Ht 68.0 in | Wt 147.0 lb

## 2013-03-10 DIAGNOSIS — Z5189 Encounter for other specified aftercare: Secondary | ICD-10-CM

## 2013-03-10 DIAGNOSIS — S81802D Unspecified open wound, left lower leg, subsequent encounter: Secondary | ICD-10-CM

## 2013-03-10 DIAGNOSIS — L0390 Cellulitis, unspecified: Secondary | ICD-10-CM

## 2013-03-10 DIAGNOSIS — L0291 Cutaneous abscess, unspecified: Secondary | ICD-10-CM

## 2013-03-10 NOTE — Progress Notes (Signed)
Patient ID: [name], male   DOB: Jun 27, 1911, 79 y.o.   MRN: [id number]  Chief Complaint  Patient presents with  . Cellulitis    left lower leg, follow-up   Code Status: Living Will, DNR      Contact Information   Name Relation Home Work Mobile   [name] Daughter [phone]  [phone]   [name] [phone]  [phone]     HPI: This is a 79 y.o. male resident of [facility], Independent Living section evaluated today in followup of leg wound.  Last visit Cellulitis Superficial wound of left lower leg, nonhealing no evidence of surrounding cellulitis. Treat with p.o. Keflex. Clinic nurse will change the dressing on Friday, I will see him again next week  Callus of foot Calluses of left foot very bothersome to this patient, causing significant discomfort with walking. After shaving ulcers today, patient reports his foot feels very comfortable.   Allergies  Allergen Reactions  . Metoprolol       Medication List       This list is accurate as of: 03/10/13  5:20 PM.  Always use your most recent med list.               amLODipine 5 MG tablet  Commonly known as:  NORVASC  take 1 tablet by mouth once daily     aspirin 81 MG tablet  Take 81 mg by mouth 3 (three) times a week.     beta carotene w/minerals tablet  Take 1 tablet by mouth daily. For eyes     cephALEXin 500 MG capsule  Commonly known as:  KEFLEX  Take 500 mg by mouth 2 (two) times daily.     doxepin 25 MG capsule  Commonly known as:  SINEQUAN  Take 1 capsule (25 mg total) by mouth at bedtime.     enalapril 2.5 MG tablet  Commonly known as:  VASOTEC  Take one tablet once a day to treat heart failure     fish oil-omega-3 fatty acids 1000 MG capsule  Take by mouth daily.     furosemide 20 MG tablet  Commonly known as:  LASIX  Take 1 tablet (20 mg total) by mouth daily.     multivitamin tablet  Take 1 tablet by mouth daily.     NON FORMULARY  as  needed. oxygen     polyethylene glycol packet  Commonly known as:  MIRALAX / GLYCOLAX  Take 17 g by mouth daily. Mix in 6 oz beverage in morin ing to relieve constipation     SENOKOT S 8.6-50 MG per tablet  Generic drug:  senna-docusate  Take 2 tablets by mouth. Nightly for stool softener and laxative, as needed     sodium chloride 2 % ophthalmic solution  Commonly known as:  MURO 128  Place 1 drop into the left eye 2 (two) times daily.     tamsulosin 0.4 MG Caps capsule  Commonly known as:  FLOMAX  Take one capsule by mouth once daily        Data Reviewed        FIE:PPIRJJO, external 12/10/2011 BMP: Sodium 137, Potassium 4.2, glucose 88, BUN 31, Creatinine 1.18 06/16/2012 CBC: Wbc 4.1, Rbc 3.95, Hct 38.8, Platelet 119 ,Plt 119 CMP: Sodium 136, Potassium 4.6, glucose 87, BUN 35, Creatinine 1.30 protein/LFTs WNL  GFR 49.5 TSH 3.224  12/17/2012 glucose 94, BUN 26, creatinine 1.16, sodium 133, potassium 4.4      Review of Systems  DATA OBTAINED: from patient, GENERAL: Feels well. No fevers, fatigue, change in activity status. No change in appetite, weight . RESPIRATORY: No cough, wheezing, SOB CARDIAC: No chest pain, palpitations. No edema. GI: No abdominal pain. No N/V/D or constipation. No heartburn or reflux.  MUSCULOSKELETAL:  Left lower leg with minimal discomfort No joint pain, swelling or stiffness. No back pain. No muscle ache, pain, weakness. Gait is unsteady, uses walker. No recent falls.  NEUROLOGIC: No dizziness, fainting, headache. No change in mental status. Does notice slow retrieval of information (names, dates) PSYCHIATRIC: No anxiety, depression, behavior issue. Sleeps well.   Physical Exam Filed Vitals:   03/10/13 1619  BP: 118/64  Pulse: 64  Temp: 95.8 F (35.4 C)  TempSrc: Oral  Height: 5\' 8"  (1.727 m)  Weight: 147 lb (66.679 kg)   Body mass index is 22.36 kg/(m^2).  GENERAL APPEARANCE: No acute distress, appropriately groomed, normal body  habitus. Alert, pleasant, conversant. SKIN: Left lateral lower leg with superficial open wound approximately 5 cm x 1.5cm in size. Small portion of the wound edges are dark, edges are thin,  wound base remains red ? Intermixed with slough. Skin surrounding this wound anterior lower leg warm, non tender, not swollen. Cleansed with normal saline, covered with Mepitel, gauze and Hypafix tape. HEAD: Normocephalic, atraumatic .  RESPIRATORY: Breathing is even, unlabored.   EDEMA: No peripheral  edema.  MUSCULOSKELETAL: Moves all extremities with full ROM, strength and tone. Back is with mild kyphosis, no spinal process tenderness. Gait is unsteady, stooped - OK with walker NEUROLOGIC: Oriented to time, place, person. Speech clear, no tremor.   PSYCHIATRIC: Mood and affect appropriate to situation  ASSESSMENT/PLAN  Cellulitis Cellulitis has resolved, the patient has completed antibiotic course.   Open wound, lower leg Superficial wound left lower leg caused by mechanical injury. Wound is slow to heal, complicated by cellulitis. Cellulitis is now resolved, wound is contracting slowly. Clinic nurse will continue 3 times a week dressing changes. Will followup at patient's next regular clinic appointment.   Follow up: As scheduled  Patty Leitzke T.Whitfield Dulay, NP-C 03/10/2013

## 2013-03-10 NOTE — Assessment & Plan Note (Signed)
Superficial wound left lower leg caused by mechanical injury. Wound is slow to heal, complicated by cellulitis. Cellulitis is now resolved, wound is contracting slowly. Clinic nurse will continue 3 times a week dressing changes. Will followup at patient's next regular clinic appointment.

## 2013-03-10 NOTE — Assessment & Plan Note (Signed)
Cellulitis has resolved, the patient has completed antibiotic course.

## 2013-03-19 ENCOUNTER — Encounter: Payer: Self-pay | Admitting: Cardiology

## 2013-03-19 ENCOUNTER — Telehealth: Payer: Self-pay | Admitting: Cardiology

## 2013-03-19 ENCOUNTER — Ambulatory Visit (INDEPENDENT_AMBULATORY_CARE_PROVIDER_SITE_OTHER): Payer: Medicare Other | Admitting: Cardiology

## 2013-03-19 VITALS — BP 150/86 | HR 71 | Wt 146.8 lb

## 2013-03-19 DIAGNOSIS — I4891 Unspecified atrial fibrillation: Secondary | ICD-10-CM

## 2013-03-19 DIAGNOSIS — Z23 Encounter for immunization: Secondary | ICD-10-CM

## 2013-03-19 DIAGNOSIS — I119 Hypertensive heart disease without heart failure: Secondary | ICD-10-CM

## 2013-03-19 DIAGNOSIS — I10 Essential (primary) hypertension: Secondary | ICD-10-CM

## 2013-03-19 DIAGNOSIS — I480 Paroxysmal atrial fibrillation: Secondary | ICD-10-CM

## 2013-03-19 DIAGNOSIS — I259 Chronic ischemic heart disease, unspecified: Secondary | ICD-10-CM

## 2013-03-19 DIAGNOSIS — Z0000 Encounter for general adult medical examination without abnormal findings: Secondary | ICD-10-CM

## 2013-03-19 NOTE — Patient Instructions (Signed)
Your physician recommends that you continue on your current medications as directed. Please refer to the Current Medication list given to you today.  Your physician recommends that you schedule a follow-up appointment in: 4 MONTH OV/BMET/CBC  OK FOR FLU VACCINE TODAY

## 2013-03-19 NOTE — Assessment & Plan Note (Signed)
Blood pressure mildly elevated systolic today.  No dizziness.  No syncope.  We will continue current medication.  At his age we will accept mild systolic hypertension

## 2013-03-19 NOTE — Progress Notes (Signed)
[name] Date of Birth:  06/30/1911 [address] [place], [place]  [address] [phone]         Fax   [phone]  History of Present Illness: This pleasant 79 year old gentleman is seen for a four-month followup office visit. He has a history of paroxysmal atrial fibrillation. He was hospitalized over Christmas 2012 for 10 days at [facility] because of acute pneumonia. He did not require intubation but was seriously ill with problems of disorientation and confusion while in the intensive care unit. He also was in atrial flutter with a rapid ventricular response. He has a past history of paroxysmal atrial fib but is not on Coumadin because of his age and because of the previous history of recurrent GI bleeds. The patient has not been having any hematochezia or melena. .  Overall the patient has been doing well.  He had a recent accident involving his left leg and the door of his car.  He suffered a skin tear.  The nurses at [facility] have been caring for the wound and he reports that it is healing nicely.   Current Outpatient Prescriptions  Medication Sig Dispense Refill  . amLODipine (NORVASC) 5 MG tablet take 1 tablet by mouth once daily  30 tablet  6  . aspirin 81 MG tablet Take 81 mg by mouth 3 (three) times a week.        . beta carotene w/minerals (OCUVITE) tablet Take 1 tablet by mouth daily. For eyes      . doxepin (SINEQUAN) 25 MG capsule Take 1 capsule (25 mg total) by mouth at bedtime.  90 capsule  3  . enalapril (VASOTEC) 2.5 MG tablet Take one tablet once a day to treat heart failure  30 tablet  5  . fish oil-omega-3 fatty acids 1000 MG capsule Take by mouth daily.        . furosemide (LASIX) 20 MG tablet Take 1 tablet (20 mg total) by mouth daily.  90 tablet  1  . Multiple Vitamin (MULTIVITAMIN) tablet Take 1 tablet by mouth daily.        . NON FORMULARY as needed. oxygen      . polyethylene glycol (MIRALAX / GLYCOLAX) packet Take 17 g by  mouth daily. Mix in 6 oz beverage in morin ing to relieve constipation      . senna-docusate (SENOKOT S) 8.6-50 MG per tablet Take 2 tablets by mouth. Nightly for stool softener and laxative, as needed      . sodium chloride (MURO 128) 2 % ophthalmic solution Place 1 drop into the left eye 2 (two) times daily.      . tamsulosin (FLOMAX) 0.4 MG CAPS Take one capsule by mouth once daily  30 capsule  4  . [DISCONTINUED] levalbuterol (XOPENEX HFA) 45 MCG/ACT inhaler Inhale 1-2 puffs into the lungs every 6 (six) hours as needed for wheezing.  1 Inhaler    . [DISCONTINUED] metoprolol tartrate (LOPRESSOR) 25 MG tablet Take 1 tablet (25 mg total) by mouth 2 (two) times daily.      . [DISCONTINUED] nitroGLYCERIN (NITROSTAT) 0.4 MG SL tablet Place 0.4 mg under the tongue every 5 (five) minutes as needed.         No current facility-administered medications for this visit.    Allergies  Allergen Reactions  . Metoprolol     Patient Active Problem List   Diagnosis Date Noted  . Cellulitis 03/03/2013  . Callus of foot 03/03/2013  .  Open wound, lower leg 03/02/2013  . Acute systolic heart failure 09/23/2012  . Unspecified constipation 09/23/2012  . Unspecified hereditary and idiopathic peripheral neuropathy 09/23/2012  . Unspecified essential hypertension 09/23/2012  . Atrial flutter 07/01/2012  . Diverticulosis of colon (without mention of hemorrhage) 07/01/2012  . PAF (paroxysmal atrial fibrillation) 10/24/2011  . Acute interstitial pneumonia 05/29/2011  . Hypokalemia 05/16/2011  . Influenza A 05/15/2011  . Encephalopathy 05/15/2011  . Thrombocytopenia 05/15/2011  . Community acquired pneumonia 05/14/2011  . NSTEMI (non-ST elevated myocardial infarction) 05/14/2011  . Inguinal hernia, left. 04/17/2011  . Atrial fibrillation 10/24/2010  . Benign hypertensive heart disease without heart failure 10/24/2010  . Chronic ischemic heart disease 10/24/2010    History  Smoking status  . Former  Smoker  . Quit date: 05/20/1974  Smokeless tobacco  . Never Used    History  Alcohol Use  . Yes    Comment: 3 oz daily     Family History  Problem Relation Age of Onset  . Heart attack Father   . Heart disease Father   . Rheumatologic disease Mother     Review of Systems: Constitutional: no fever chills diaphoresis or fatigue or change in weight.  Head and neck: no hearing loss, no epistaxis, no photophobia or visual disturbance. Respiratory: No cough, shortness of breath or wheezing. Cardiovascular: No chest pain peripheral edema, palpitations. Gastrointestinal: No abdominal distention, no abdominal pain, no change in bowel habits hematochezia or melena. Genitourinary: No dysuria, no frequency, no urgency, no nocturia. Musculoskeletal:No arthralgias, no back pain, no gait disturbance or myalgias. Neurological: No dizziness, no headaches, no numbness, no seizures, no syncope, no weakness, no tremors. Hematologic: No lymphadenopathy, no easy bruising. Psychiatric: No confusion, no hallucinations, no sleep disturbance.    Physical Exam: Filed Vitals:   03/19/13 1241  BP: 150/86  Pulse: 71   the general appearance reveals a well-developed well-nourished very elderly gentleman in no distress.  He is still very mentally alert.The head and neck exam reveals pupils equal and reactive.  Extraocular movements are full.  There is no scleral icterus.  The mouth and pharynx are normal.  The neck is supple.  The carotids reveal no bruits.  The jugular venous pressure is normal.  The  thyroid is not enlarged.  There is no lymphadenopathy.  The chest is clear to percussion and auscultation.  There are no rales or rhonchi.  Expansion of the chest is symmetrical.  The precordium is quiet.  The first heart sound is normal.  The second heart sound is physiologically split.  The pulse is irregularly irregular  There is no murmur gallop rub or click.  There is no abnormal lift or heave.  The abdomen  is soft and nontender.  The bowel sounds are normal.  The liver and spleen are not enlarged.  There are no abdominal masses.  There are no abdominal bruits.  Extremities reveal good pedal pulses.  There is no phlebitis or edema.  There is no cyanosis or clubbing.  Strength is normal and symmetrical in all extremities.  There is no lateralizing weakness.  There are no sensory deficits.  The skin is warm and dry.  There is no rash.  EKG today shows normal sinus rhythm with first degree AV block and frequent PACs.  There is bifascicular block.  Since 07/01/12, atrial flutter is no longer seen.   Assessment / Plan: Continue same medication.  We gave him a flu shot today at his request.  Recheck in 4  months for office visit CBC and basal metabolic panel

## 2013-03-19 NOTE — Assessment & Plan Note (Signed)
The patient has not been aware of any recurrent fast heart rate or atrial fibrillation.  He is not on anticoagulation because of previous severe GI bleeds

## 2013-03-19 NOTE — Assessment & Plan Note (Signed)
The patient has not been experiencing any chest pain or angina. 

## 2013-03-19 NOTE — Telephone Encounter (Signed)
New Problem  [name] called requesting an update from the appointment today. She wants to be sure that he received a FLU Shot and that there were no changes in medications. Please call

## 2013-03-19 NOTE — Addendum Note (Signed)
Addended by: Kem Parkinson on: 03/19/2013 04:04 PM   Modules accepted: Orders

## 2013-03-22 NOTE — Telephone Encounter (Signed)
Spoke with daughter Friday with update from [facility]

## 2013-03-24 ENCOUNTER — Non-Acute Institutional Stay: Payer: Medicare Other | Admitting: Geriatric Medicine

## 2013-03-24 ENCOUNTER — Encounter: Payer: Self-pay | Admitting: Geriatric Medicine

## 2013-03-24 VITALS — BP 104/62 | HR 76 | Ht 68.0 in | Wt 149.0 lb

## 2013-03-24 DIAGNOSIS — L84 Corns and callosities: Secondary | ICD-10-CM

## 2013-03-24 DIAGNOSIS — S81802D Unspecified open wound, left lower leg, subsequent encounter: Secondary | ICD-10-CM

## 2013-03-24 DIAGNOSIS — N400 Enlarged prostate without lower urinary tract symptoms: Secondary | ICD-10-CM

## 2013-03-24 DIAGNOSIS — I10 Essential (primary) hypertension: Secondary | ICD-10-CM

## 2013-03-24 DIAGNOSIS — Z5189 Encounter for other specified aftercare: Secondary | ICD-10-CM

## 2013-03-24 NOTE — Progress Notes (Signed)
Patient ID: [name], male   DOB: Jul 25, 1911, 79 y.o.   MRN: [id number] [facility] [phone])  Code Status: Living Will, DO NOT RESUSCITATE  Contact Information   Name Relation Home Work Mobile   [place] Daughter [phone]  [phone]   [name] [phone]  [phone]       Chief Complaint  Patient presents with  . Medical Managment of Chronic Issues    blood pressure, A-Fib, constipation    HPI: This is a 79 y.o. male resident of [facility]  section evaluated today for management of ongoing medical issues.    Recent visits  Open wound, lower leg  Superficial wound left lower leg caused by mechanical injury. Wound is slow to heal, complicated by cellulitis. Cellulitis is now resolved, wound is contracting slowly. Clinic nurse will continue 3 times a week dressing changes. Will followup at patient's next regular clinic appointment.  Acute systolic heart failure Patient remains without any signs of heart failure. Tolerating lower dose of diuretics without any peripheral edema, lab with mildly low sodium, otherwise within normal limits.  Unspecified constipation Symptoms are stable, having regular bowel movements with current dose of MiraLax.  Unspecified essential hypertension Blood pressure stable on current medications, laboratory studies satisfactory.    Today:  Patient reports he saw Dr. Patty Sermons October 31, no medication changes were recommended. Received his flu shot at that visit.  Left leg continues to be uncomfortable. Nurse has been changing the dressing 3 times a week.    Allergies  Allergen Reactions  . Metoprolol    Medications Reviewed  DATA REVIEWED  Radiologic Exams:   Cardiovascular Exams:   Laboratory Studies  [facility]  12/10/2011 BMP: Sodium 137, Potassium 4.2, glucose 88, BUN 31, Creatinine 1.18 06/16/2012 CBC: Wbc 4.1, Rbc 3.95, Hct 38.8, Platelet  119 ,Plt 119 CMP: Sodium 136, Potassium 4.6, glucose 87, BUN 35, Creatinine 1.30 protein/LFTs WNL   GFR 49.5 TSH 3.224  12/17/2012 glucose 94, BUN 26, creatinine 1.16, sodium 133, potassium 4.4     Review of Systems  DATA OBTAINED: from patient, daughter GENERAL: Feels well. No fevers, fatigue, change in activity status. No change in appetite, weight . SKIN: No itch, rash Left leg wound EYES: No eye pain, dryness or itching. No change in vision EARS: No earache, change in hearing NOSE: No congestion, drainage or bleeding MOUTH/THROAT: No mouth or tooth pain. No difficulty chewing or swallowing. Takes pills with applesauce RESPIRATORY: No cough, wheezing, SOB CARDIAC: No chest pain, palpitations. No edema. GI: No abdominal pain. No N/V/D or constipation. No heartburn or reflux.   GU:  Has small amount of urine leakage, usually in the AM. No dysuria, frequency or urgency. Nocturia x2-3 (unchanged) No change in stream. No change in urine volume or character MUSCULOSKELETAL: Left foot discomfort, intermittent No joint pain, swelling or stiffness. No back pain. No muscle ache, pain, weakness. Gait is unsteady, uses walker. No recent falls.  NEUROLOGIC: No dizziness, fainting, headache. No change in mental status. Does notice slow retrieval of information (names, dates) PSYCHIATRIC: No anxiety, depression, behavior issue. Sleeps well.   Physical Exam Filed Vitals:   03/24/13 1315  BP: 104/62  Pulse: 76  Height: 5\' 8"  (1.727 m)  Weight: 149 lb (67.586 kg)   Body mass index is 22.66 kg/(m^2).  GENERAL APPEARANCE: No acute distress, appropriately groomed, normal body habitus. Alert, pleasant, conversant. SKIN: Left lateral lower leg with superficial open wound approximately 5 cm x 1.5cm in  size. Wound edges are thin,  wound base 90% granulation, 10% slough. Skin surrounding this wound with mild edema, red. Cleansed with normal saline, covered with Mepitel, gauze and Hypafix tape.  Left  foot callous causing some foot discomfort, shaved today  HEAD: Normocephalic, atraumatic EYES: Conjunctiva/lids clear. Pupils round, reactive. Marland Kitchen   EARS: External exam WNL, Decreased hearing, hearing aides inplace.    NOSE: No deformity or discharge. MOUTH/THROAT: Lips w/o lesions. Oral mucosa, tongue moist, w/o lesion. Oropharynx w/o redness or lesions.   NECK: Supple, full ROM. No thyroid tenderness, enlargement or nodule LYMPHATICS: No head, neck or supraclavicular adenopathy RESPIRATORY: Breathing is even, unlabored. Lung sounds are clear and full.   CARDIOVASCULAR: Heart RRR. No murmur or extra heart sounds             EDEMA: No peripheral or periorbital edema.   GASTROINTESTINAL: Abdomen is soft, non-tender, not distended w/ normal bowel sounds.   MUSCULOSKELETAL: Moves all extremities with full ROM, strength and tone. Back is with mild kyphosis, no spinal process tenderness. Gait is unsteady, stooped - OK with walker NEUROLOGIC: Oriented to time, place, person. Speech clear, no tremor.   PSYCHIATRIC: Mood and affect appropriate to situation   ASSESSMENT/PLAN  Open wound, lower leg Wound remains clean,  Edema in the lower leg is likely slowing the wound healing. Have applied Ace wrap today have asked patient to leave this in place until he sees the nurse on Friday. Continue 3 times a week dressing change  BPH (benign prostatic hyperplasia) Small amount of leakage in AM, followed by hesitancy in AM (overflow?), improve after Flomax dosing.  Recommend bedtime dosing  Callus of foot Callus of left foot less bothersome after shaving  Unspecified essential hypertension BP satisfactory today, continue present medication    Follow up: 3 months  Mabel Unrein T.Marjan Rosman, NP-C 03/24/2013

## 2013-03-26 DIAGNOSIS — N400 Enlarged prostate without lower urinary tract symptoms: Secondary | ICD-10-CM | POA: Insufficient documentation

## 2013-03-26 NOTE — Assessment & Plan Note (Signed)
Wound remains clean,  Edema in the lower leg is likely slowing the wound healing. Have applied Ace wrap today have asked patient to leave this in place until he sees the nurse on Friday. Continue 3 times a week dressing change

## 2013-03-26 NOTE — Assessment & Plan Note (Signed)
BP satisfactory today, continue present medication

## 2013-03-26 NOTE — Assessment & Plan Note (Signed)
Small amount of leakage in AM, followed by hesitancy in AM (overflow?), improve after Flomax dosing.  Recommend bedtime dosing

## 2013-03-26 NOTE — Assessment & Plan Note (Signed)
Callus of left foot less bothersome after shaving

## 2013-04-28 ENCOUNTER — Encounter: Payer: Self-pay | Admitting: Geriatric Medicine

## 2013-04-28 ENCOUNTER — Non-Acute Institutional Stay: Payer: Medicare Other | Admitting: Geriatric Medicine

## 2013-04-28 VITALS — BP 102/60 | HR 72 | Temp 96.2°F | Wt 150.0 lb

## 2013-04-28 DIAGNOSIS — R0982 Postnasal drip: Secondary | ICD-10-CM

## 2013-04-28 DIAGNOSIS — S81802D Unspecified open wound, left lower leg, subsequent encounter: Secondary | ICD-10-CM

## 2013-04-28 DIAGNOSIS — J329 Chronic sinusitis, unspecified: Secondary | ICD-10-CM

## 2013-04-28 DIAGNOSIS — Z5189 Encounter for other specified aftercare: Secondary | ICD-10-CM

## 2013-04-28 NOTE — Progress Notes (Signed)
Patient ID: [name], male   DOB: Feb 11, 1912, 79 y.o.   MRN: [id number] [name] [facility] [phone])  Code Status: Living Will, DO NOT RESUSCITATE  Contact Information   Name Relation Home Work Mobile   [name] Daughter [phone]  [phone]   [name] [phone]  [phone]       Chief Complaint  Patient presents with  . Wound Check    left lower leg    HPI: This is a 79 y.o. male resident of [facility]  section evaluated today for management of leg wound.   Patient is also concerned about his breathing, has noticed some shortness of breath in the last 2 days, also feels some mild upper chest congestion.  Last visit: Open wound, lower leg Wound remains clean,  Edema in the lower leg is likely slowing the wound healing. Have applied Ace wrap today have asked patient to leave this in place until he sees the nurse on Friday. Continue 3 times a week dressing change    Allergies  Allergen Reactions  . Metoprolol    Medications Reviewed  DATA REVIEWED  Radiologic Exams:   Cardiovascular Exams:   Laboratory Studies  [facility]  12/10/2011 BMP: Sodium 137, Potassium 4.2, glucose 88, BUN 31, Creatinine 1.18 06/16/2012 CBC: Wbc 4.1, Rbc 3.95, Hct 38.8, Platelet 119 ,Plt 119 CMP: Sodium 136, Potassium 4.6, glucose 87, BUN 35, Creatinine 1.30 protein/LFTs WNL   GFR 49.5 TSH 3.224  12/17/2012 glucose 94, BUN 26, creatinine 1.16, sodium 133, potassium 4.4     Review of Systems  DATA OBTAINED: from patient, daughter GENERAL: Feels well. No fevers, fatigue, change in activity status. No change in appetite, weight . SKIN: No itch, rash Left leg wound EYES: No eye pain, dryness or itching. No change in vision EARS: No earache, change in hearing NOSE: No congestion or bleeding. Feels something in the back of his throat MOUTH/THROAT: No mouth or tooth pain. No difficulty chewing or  swallowing. Takes pills with applesauce RESPIRATORY: No cough, wheezing. Mild SOB CARDIAC: No chest pain, palpitations. No edema. NEUROLOGIC: No dizziness, fainting, headache. No change in mental status. Does notice slow retrieval of information (names, dates) PSYCHIATRIC: No anxiety, depression, behavior issue. Sleeps well.   Physical Exam Filed Vitals:   04/28/13 1333  BP: 102/60  Pulse: 72  Temp: 96.2 F (35.7 C)  TempSrc: Oral  Weight: 150 lb (68.04 kg)   Body mass index is 22.81 kg/(m^2).  GENERAL APPEARANCE: No acute distress, appropriately groomed, normal body habitus. Alert, pleasant, conversant. SKIN: Left lateral lower leg wound essentially unchanged from last visit; wound remains superficial, clean, granulating but not decreased in size. Mild LE edema HEAD: Normocephalic, atraumatic EYES: Conjunctiva/lids clear. Pupils round, reactive. Marland Kitchen   EARS: External exam WNL, Decreased hearing, hearing aides inplace.    NOSE: No deformity or discharge. MOUTH/THROAT: Lips w/o lesions. Oral mucosa, tongue moist, w/o lesion. Oropharynx w/o redness or lesions.   NECK: Supple, full ROM. No thyroid tenderness, enlargement or nodule LYMPHATICS: No head, neck or supraclavicular adenopathy RESPIRATORY: Breathing is even, unlabored. Lung sounds are clear and full.   CARDIOVASCULAR: Heart RRR. No murmur or extra heart sounds             EDEMA: No peripheral or periorbital edema.   GASTROINTESTINAL: Abdomen is soft, non-tender, not distended w/ normal bowel sounds.   MUSCULOSKELETAL: Moves all extremities with full ROM, strength and tone. Back is with mild kyphosis, no spinal process  tenderness. Gait is unsteady, stooped - OK with walker NEUROLOGIC: Oriented to time, place, person. Speech clear, no tremor.   PSYCHIATRIC: Mood and affect appropriate to situation   ASSESSMENT/PLAN  Open wound, lower leg Wound healing appears tall. Wound care nurse will apply a [employer] boot, and reassess later  this week.  Post-nasal drainage Postnasal drainage likely causing patient's mild upper airway congestion and discomfort. Recommend Claritin qhs forthe next week. If no improvement he's been instructed to return to the clinic.    Follow up: As scheduled  Swayzee Wadley T.Krizia Flight, NP-C 04/28/2013

## 2013-05-02 DIAGNOSIS — R0982 Postnasal drip: Secondary | ICD-10-CM | POA: Insufficient documentation

## 2013-05-02 MED ORDER — LORATADINE 10 MG PO TABS: 10.0000 mg | ORAL_TABLET | Freq: Every day | ORAL | Status: DC

## 2013-05-02 NOTE — Assessment & Plan Note (Signed)
Wound healing appears tall. Wound care nurse will apply a [employer] boot, and reassess later this week.

## 2013-05-02 NOTE — Assessment & Plan Note (Signed)
Postnasal drainage likely causing patient's mild upper airway congestion and discomfort. Recommend Claritin qhs forthe next week. If no improvement he's been instructed to return to the clinic.

## 2013-05-23 ENCOUNTER — Inpatient Hospital Stay (HOSPITAL_COMMUNITY)
Admission: EM | Admit: 2013-05-23 | Discharge: 2013-05-26 | DRG: 291 | Disposition: A | Discharge status: Nursing Home (Includes ICF) | Payer: Medicare Other | Attending: Family Medicine | Admitting: Family Medicine

## 2013-05-23 ENCOUNTER — Encounter (HOSPITAL_COMMUNITY): Payer: Self-pay | Admitting: Emergency Medicine

## 2013-05-23 ENCOUNTER — Emergency Department (HOSPITAL_COMMUNITY): Payer: Medicare Other

## 2013-05-23 DIAGNOSIS — J189 Pneumonia, unspecified organism: Secondary | ICD-10-CM

## 2013-05-23 DIAGNOSIS — K5900 Constipation, unspecified: Secondary | ICD-10-CM

## 2013-05-23 DIAGNOSIS — S99929A Unspecified injury of unspecified foot, initial encounter: Secondary | ICD-10-CM

## 2013-05-23 DIAGNOSIS — I509 Heart failure, unspecified: Secondary | ICD-10-CM | POA: Diagnosis present

## 2013-05-23 DIAGNOSIS — I119 Hypertensive heart disease without heart failure: Secondary | ICD-10-CM

## 2013-05-23 DIAGNOSIS — I214 Non-ST elevation (NSTEMI) myocardial infarction: Secondary | ICD-10-CM

## 2013-05-23 DIAGNOSIS — S8990XA Unspecified injury of unspecified lower leg, initial encounter: Secondary | ICD-10-CM | POA: Diagnosis present

## 2013-05-23 DIAGNOSIS — J9600 Acute respiratory failure, unspecified whether with hypoxia or hypercapnia: Secondary | ICD-10-CM | POA: Diagnosis present

## 2013-05-23 DIAGNOSIS — Z888 Allergy status to other drugs, medicaments and biological substances status: Secondary | ICD-10-CM

## 2013-05-23 DIAGNOSIS — S81802S Unspecified open wound, left lower leg, sequela: Secondary | ICD-10-CM

## 2013-05-23 DIAGNOSIS — IMO0002 Reserved for concepts with insufficient information to code with codable children: Secondary | ICD-10-CM

## 2013-05-23 DIAGNOSIS — R0902 Hypoxemia: Secondary | ICD-10-CM

## 2013-05-23 DIAGNOSIS — I2510 Atherosclerotic heart disease of native coronary artery without angina pectoris: Secondary | ICD-10-CM | POA: Diagnosis present

## 2013-05-23 DIAGNOSIS — N400 Enlarged prostate without lower urinary tract symptoms: Secondary | ICD-10-CM

## 2013-05-23 DIAGNOSIS — S81802A Unspecified open wound, left lower leg, initial encounter: Secondary | ICD-10-CM

## 2013-05-23 DIAGNOSIS — S99919A Unspecified injury of unspecified ankle, initial encounter: Secondary | ICD-10-CM

## 2013-05-23 DIAGNOSIS — Z79899 Other long term (current) drug therapy: Secondary | ICD-10-CM

## 2013-05-23 DIAGNOSIS — L0390 Cellulitis, unspecified: Secondary | ICD-10-CM

## 2013-05-23 DIAGNOSIS — I5021 Acute systolic (congestive) heart failure: Secondary | ICD-10-CM

## 2013-05-23 DIAGNOSIS — R918 Other nonspecific abnormal finding of lung field: Secondary | ICD-10-CM

## 2013-05-23 DIAGNOSIS — Z8673 Personal history of transient ischemic attack (TIA), and cerebral infarction without residual deficits: Secondary | ICD-10-CM

## 2013-05-23 DIAGNOSIS — Z7982 Long term (current) use of aspirin: Secondary | ICD-10-CM

## 2013-05-23 DIAGNOSIS — I259 Chronic ischemic heart disease, unspecified: Secondary | ICD-10-CM | POA: Diagnosis present

## 2013-05-23 DIAGNOSIS — I10 Essential (primary) hypertension: Secondary | ICD-10-CM | POA: Diagnosis present

## 2013-05-23 DIAGNOSIS — I4892 Unspecified atrial flutter: Secondary | ICD-10-CM

## 2013-05-23 DIAGNOSIS — Z96649 Presence of unspecified artificial hip joint: Secondary | ICD-10-CM

## 2013-05-23 DIAGNOSIS — L84 Corns and callosities: Secondary | ICD-10-CM

## 2013-05-23 DIAGNOSIS — Z8249 Family history of ischemic heart disease and other diseases of the circulatory system: Secondary | ICD-10-CM

## 2013-05-23 DIAGNOSIS — X58XXXA Exposure to other specified factors, initial encounter: Secondary | ICD-10-CM

## 2013-05-23 DIAGNOSIS — I5023 Acute on chronic systolic (congestive) heart failure: Secondary | ICD-10-CM

## 2013-05-23 DIAGNOSIS — K5730 Diverticulosis of large intestine without perforation or abscess without bleeding: Secondary | ICD-10-CM

## 2013-05-23 DIAGNOSIS — Z87891 Personal history of nicotine dependence: Secondary | ICD-10-CM

## 2013-05-23 DIAGNOSIS — R0982 Postnasal drip: Secondary | ICD-10-CM

## 2013-05-23 DIAGNOSIS — I4891 Unspecified atrial fibrillation: Secondary | ICD-10-CM | POA: Diagnosis present

## 2013-05-23 DIAGNOSIS — I5033 Acute on chronic diastolic (congestive) heart failure: Principal | ICD-10-CM | POA: Diagnosis present

## 2013-05-23 DIAGNOSIS — I480 Paroxysmal atrial fibrillation: Secondary | ICD-10-CM

## 2013-05-23 DIAGNOSIS — J849 Interstitial pulmonary disease, unspecified: Secondary | ICD-10-CM

## 2013-05-23 DIAGNOSIS — I252 Old myocardial infarction: Secondary | ICD-10-CM

## 2013-05-23 DIAGNOSIS — R5381 Other malaise: Secondary | ICD-10-CM | POA: Diagnosis present

## 2013-05-23 DIAGNOSIS — J9691 Respiratory failure, unspecified with hypoxia: Secondary | ICD-10-CM | POA: Diagnosis present

## 2013-05-23 DIAGNOSIS — G609 Hereditary and idiopathic neuropathy, unspecified: Secondary | ICD-10-CM

## 2013-05-23 DIAGNOSIS — Z66 Do not resuscitate: Secondary | ICD-10-CM | POA: Diagnosis present

## 2013-05-23 DIAGNOSIS — D696 Thrombocytopenia, unspecified: Secondary | ICD-10-CM

## 2013-05-23 LAB — CBC WITH DIFFERENTIAL/PLATELET
Basophils Absolute: 0 10*3/uL (ref 0.0–0.1)
Basophils Relative: 1 % (ref 0–1)
EOS PCT: 1 % (ref 0–5)
Eosinophils Absolute: 0 10*3/uL (ref 0.0–0.7)
HEMATOCRIT: 39.1 % (ref 39.0–52.0)
Hemoglobin: 13.8 g/dL (ref 13.0–17.0)
LYMPHS ABS: 0.4 10*3/uL — AB (ref 0.7–4.0)
LYMPHS PCT: 8 % — AB (ref 12–46)
MCH: 34.1 pg — ABNORMAL HIGH (ref 26.0–34.0)
MCHC: 35.3 g/dL (ref 30.0–36.0)
MCV: 96.5 fL (ref 78.0–100.0)
MONO ABS: 0.7 10*3/uL (ref 0.1–1.0)
Monocytes Relative: 12 % (ref 3–12)
NEUTROS ABS: 4.5 10*3/uL (ref 1.7–7.7)
Neutrophils Relative %: 79 % — ABNORMAL HIGH (ref 43–77)
PLATELETS: 133 10*3/uL — AB (ref 150–400)
RBC: 4.05 MIL/uL — AB (ref 4.22–5.81)
RDW: 14.9 % (ref 11.5–15.5)
WBC: 5.7 10*3/uL (ref 4.0–10.5)

## 2013-05-23 LAB — TROPONIN I
Troponin I: <0.3 ng/mL (ref <0.30)
Troponin I: <0.3 ng/mL (ref <0.30)

## 2013-05-23 LAB — BASIC METABOLIC PANEL
BUN: 25 mg/dL — ABNORMAL HIGH (ref 6–23)
CALCIUM: 9.1 mg/dL (ref 8.4–10.5)
CHLORIDE: 92 meq/L — AB (ref 96–112)
CO2: 22 meq/L (ref 19–32)
Creatinine, Ser: 1.01 mg/dL (ref 0.50–1.35)
GFR calc Af Amer: 69 mL/min — ABNORMAL LOW (ref >90)
GFR calc non Af Amer: 59 mL/min — ABNORMAL LOW (ref >90)
GLUCOSE: 92 mg/dL (ref 70–99)
Potassium: 4.8 mEq/L (ref 3.7–5.3)
SODIUM: 129 meq/L — AB (ref 137–147)

## 2013-05-23 LAB — URINALYSIS, ROUTINE W REFLEX MICROSCOPIC
Bilirubin Urine: NEGATIVE
Glucose, UA: NEGATIVE mg/dL
Ketones, ur: NEGATIVE mg/dL
Leukocytes, UA: NEGATIVE
Nitrite: NEGATIVE
Protein, ur: NEGATIVE mg/dL
Specific Gravity, Urine: 1.014 (ref 1.005–1.030)
Urobilinogen, UA: 0.2 mg/dL (ref 0.0–1.0)
pH: 6 (ref 5.0–8.0)

## 2013-05-23 LAB — PHOSPHORUS: PHOSPHORUS: 2.8 mg/dL (ref 2.3–4.6)

## 2013-05-23 LAB — URINE MICROSCOPIC-ADD ON

## 2013-05-23 LAB — D-DIMER, QUANTITATIVE: D-Dimer, Quant: 2.76 ug/mL-FEU — ABNORMAL HIGH (ref 0.00–0.48)

## 2013-05-23 LAB — MAGNESIUM: MAGNESIUM: 2 mg/dL (ref 1.5–2.5)

## 2013-05-23 LAB — PRO B NATRIURETIC PEPTIDE: PRO B NATRI PEPTIDE: 1995 pg/mL — AB (ref 0–450)

## 2013-05-23 MED ORDER — FUROSEMIDE 10 MG/ML IJ SOLN
20.0000 mg | Freq: Once | INTRAMUSCULAR | Status: AC
  Administered 2013-05-23: 20 mg via INTRAVENOUS
  Filled 2013-05-23: qty 4

## 2013-05-23 MED ORDER — OMEGA-3 FATTY ACIDS 1000 MG PO CAPS: 1.0000 g | ORAL_CAPSULE | Freq: Every day | ORAL | Status: DC

## 2013-05-23 MED ORDER — BIOTENE DRY MOUTH MT LIQD
15.0000 mL | Freq: Two times a day (BID) | OROMUCOSAL | Status: DC
  Administered 2013-05-23 – 2013-05-26 (×6): 15 mL via OROMUCOSAL

## 2013-05-23 MED ORDER — AMLODIPINE BESYLATE 5 MG PO TABS
5.0000 mg | ORAL_TABLET | Freq: Every day | ORAL | Status: DC
  Administered 2013-05-23 – 2013-05-26 (×4): 5 mg via ORAL
  Filled 2013-05-23 (×4): qty 1

## 2013-05-23 MED ORDER — SENNOSIDES-DOCUSATE SODIUM 8.6-50 MG PO TABS: 2.0000 | ORAL_TABLET | Freq: Every evening | ORAL | Status: DC | PRN

## 2013-05-23 MED ORDER — LORATADINE 10 MG PO TABS
10.0000 mg | ORAL_TABLET | Freq: Every day | ORAL | Status: DC
  Administered 2013-05-23 – 2013-05-26 (×4): 10 mg via ORAL
  Filled 2013-05-23 (×4): qty 1

## 2013-05-23 MED ORDER — SODIUM CHLORIDE 0.9 % IV SOLN: INTRAVENOUS | Status: AC

## 2013-05-23 MED ORDER — ENALAPRIL MALEATE 2.5 MG PO TABS
2.5000 mg | ORAL_TABLET | Freq: Every day | ORAL | Status: DC
  Administered 2013-05-23 – 2013-05-26 (×4): 2.5 mg via ORAL
  Filled 2013-05-23 (×4): qty 1

## 2013-05-23 MED ORDER — SODIUM CHLORIDE 0.9 % IJ SOLN
3.0000 mL | INTRAMUSCULAR | Status: DC | PRN
  Administered 2013-05-23: 3 mL via INTRAVENOUS

## 2013-05-23 MED ORDER — OMEGA-3-ACID ETHYL ESTERS 1 G PO CAPS
1.0000 g | ORAL_CAPSULE | Freq: Every day | ORAL | Status: DC
  Administered 2013-05-24 – 2013-05-26 (×3): 1 g via ORAL
  Filled 2013-05-23 (×3): qty 1

## 2013-05-23 MED ORDER — ACETAMINOPHEN 650 MG RE SUPP
650.0000 mg | Freq: Four times a day (QID) | RECTAL | Status: DC | PRN
Start: 2013-05-23 — End: 2013-05-26

## 2013-05-23 MED ORDER — DOXEPIN HCL 25 MG PO CAPS
25.0000 mg | ORAL_CAPSULE | Freq: Every day | ORAL | Status: DC
  Administered 2013-05-23 – 2013-05-25 (×3): 25 mg via ORAL
  Filled 2013-05-23 (×4): qty 1

## 2013-05-23 MED ORDER — TAMSULOSIN HCL 0.4 MG PO CAPS
0.4000 mg | ORAL_CAPSULE | Freq: Every day | ORAL | Status: DC
  Administered 2013-05-24 – 2013-05-26 (×3): 0.4 mg via ORAL
  Filled 2013-05-23 (×4): qty 1

## 2013-05-23 MED ORDER — ASPIRIN 81 MG PO CHEW
81.0000 mg | CHEWABLE_TABLET | ORAL | Status: DC
  Administered 2013-05-24 – 2013-05-26 (×2): 81 mg via ORAL
  Filled 2013-05-23 (×3): qty 1

## 2013-05-23 MED ORDER — SODIUM CHLORIDE 0.9 % IJ SOLN
3.0000 mL | Freq: Two times a day (BID) | INTRAMUSCULAR | Status: DC
  Administered 2013-05-23 – 2013-05-26 (×2): 3 mL via INTRAVENOUS

## 2013-05-23 MED ORDER — POLYETHYLENE GLYCOL 3350 17 G PO PACK
17.0000 g | PACK | Freq: Every day | ORAL | Status: DC
  Administered 2013-05-24 – 2013-05-26 (×3): 17 g via ORAL
  Filled 2013-05-23 (×3): qty 1

## 2013-05-23 MED ORDER — ONDANSETRON HCL 4 MG/2ML IJ SOLN: 4.0000 mg | Freq: Four times a day (QID) | INTRAMUSCULAR | Status: DC | PRN

## 2013-05-23 MED ORDER — ONDANSETRON HCL 4 MG PO TABS: 4.0000 mg | ORAL_TABLET | Freq: Four times a day (QID) | ORAL | Status: DC | PRN

## 2013-05-23 MED ORDER — OCUVITE PO TABS
1.0000 | ORAL_TABLET | Freq: Every day | ORAL | Status: DC
  Administered 2013-05-24 – 2013-05-26 (×3): 1 via ORAL
  Filled 2013-05-23 (×3): qty 1

## 2013-05-23 MED ORDER — SODIUM CHLORIDE 0.9 % IJ SOLN
3.0000 mL | Freq: Two times a day (BID) | INTRAMUSCULAR | Status: DC
  Administered 2013-05-23 – 2013-05-25 (×4): 3 mL via INTRAVENOUS

## 2013-05-23 MED ORDER — FUROSEMIDE 10 MG/ML IJ SOLN
20.0000 mg | Freq: Two times a day (BID) | INTRAMUSCULAR | Status: DC
  Administered 2013-05-23 – 2013-05-25 (×4): 20 mg via INTRAVENOUS
  Filled 2013-05-23 (×7): qty 2

## 2013-05-23 MED ORDER — SODIUM CHLORIDE (HYPERTONIC) 2 % OP SOLN
1.0000 [drp] | Freq: Two times a day (BID) | OPHTHALMIC | Status: DC
  Administered 2013-05-23 – 2013-05-26 (×6): 1 [drp] via OPHTHALMIC
  Filled 2013-05-23: qty 15

## 2013-05-23 MED ORDER — SODIUM CHLORIDE 0.9 % IV SOLN
250.0000 mL | INTRAVENOUS | Status: DC | PRN
  Administered 2013-05-23: 250 mL via INTRAVENOUS

## 2013-05-23 MED ORDER — ACETAMINOPHEN 325 MG PO TABS: 650.0000 mg | ORAL_TABLET | Freq: Four times a day (QID) | ORAL | Status: DC | PRN

## 2013-05-23 MED ORDER — HEPARIN SODIUM (PORCINE) 5000 UNIT/ML IJ SOLN
5000.0000 [IU] | Freq: Three times a day (TID) | INTRAMUSCULAR | Status: DC
  Administered 2013-05-23 – 2013-05-26 (×8): 5000 [IU] via SUBCUTANEOUS
  Filled 2013-05-23 (×11): qty 1

## 2013-05-23 MED ORDER — IOHEXOL 350 MG/ML SOLN
100.0000 mL | Freq: Once | INTRAVENOUS | Status: AC | PRN
  Administered 2013-05-23: 100 mL via INTRAVENOUS

## 2013-05-23 NOTE — ED Notes (Signed)
Pt resides at [facility] and was evaluated by a nurse there this morning.  Although wife reports pt's vitals were "fine," nurse was concerned about pt's breathing.  Pt was directed to an UC.  At UC, pt's O2 sats were in low 80's and he was directed to ED.  Pt's O2 sats on arrival to ED were 78% on RA.  Pt c/o cough that is occasionally productive.

## 2013-05-23 NOTE — ED Provider Notes (Signed)
CSN: [id number]     Arrival date & time 05/23/13  1156 History   First MD Initiated Contact with Patient 05/23/13 1213     Chief Complaint  Patient presents with  . Shortness of Breath   (Consider location/radiation/quality/duration/timing/severity/associated sxs/prior Treatment) HPI Comments: [name] is a 80 y.o. male who presents for evaluation of dyspnea. The dyspnea is mostly at nighttime and improves when standing or walking. He was evaluated in urgent care today and found to have oxygen saturations in the low 80s, so he was sent here. He has had mild, nonproductive cough recently. He has had some chest discomfort, right anterior, which is mild. He denies weakness, dizziness, nausea, vomiting, change in oral intake or problems urinating. He is being treated for a chronic wound of his left lower leg. He's never had this previously. He has no recent illnesses, and no known sick contacts. There are no other known modifying factors.   Patient is a 80 y.o. male presenting with shortness of breath. The history is provided by the patient.  Shortness of Breath   Past Medical History  Diagnosis Date  . Atrial fibrillation     Not on Coumadin due to recurrent, severe GIB  . H/O: GI bleed   . Inguinal hernia   . Spinal stenosis     S/p surgery   . MI, old 27  . Diverticulitis   . BPH (benign prostatic hyperplasia)   . Anemia   . Arthritis   . Blood transfusion   . CHF (congestive heart failure)   . Hypertension   . Heart attack 1976  . Unspecified hereditary and idiopathic peripheral neuropathy 03/25/2012  . Corns and callosities 12/18/2011  . Pain in joint, ankle and foot 11/20/2011  . Chronic airway obstruction, not elsewhere classified 07/17/2011  . Other dyspnea and respiratory abnormality 07/17/2011  . Other specified cardiac dysrhythmias(427.89) 06/04/2011  . Typhoid fever 05/29/2011  . Macular degeneration (senile) of retina, unspecified 05/29/2011  . Senile cataract,  unspecified 05/29/2011  . Pericarditis, acute 05/29/2011  . Acute systolic heart failure 05/29/2011  . Unspecified venous (peripheral) insufficiency 05/29/2011  . Duodenal ulcer 05/29/2011  . Osteoarthrosis, unspecified whether generalized or localized, pelvic region and thigh 05/29/2011  . Other and unspecified disc disorder of lumbar region 05/29/2011  . Spasm of muscle 05/29/2011  . Abnormality of gait 05/29/2011  . Closed fracture of pubis 05/29/2011  . Transient ischemic attack (TIA), and cerebral infarction without residual deficits(V12.54) 2013  . Coronary atherosclerosis of native coronary artery 2013  . Pneumonia, organism unspecified 2013  . Unspecified constipation 2013  . Debility, unspecified 05/24/2011  . Unspecified essential hypertension 09/23/2012   Past Surgical History  Procedure Laterality Date  . Spine surgery      Spinal stenosis surgery with Dr. Darrelyn Hillock   . Cholecystectomy    . Total hip arthroplasty  2002    Right   . Vein surgery  2007    leg  . Inguinal hernia repair  2003    RIH  . Appendectomy    . Tonsillectomy and adenoidectomy     Family History  Problem Relation Age of Onset  . Heart attack Father   . Heart disease Father   . Rheumatologic disease Mother   . Cancer Sister     colon  . Cancer Sister     lung   History  Substance Use Topics  . Smoking status: Former Smoker    Quit date: 05/20/1974  . Smokeless tobacco:  Never Used  . Alcohol Use: Yes     Comment: 2 oz daily     Review of Systems  Respiratory: Positive for shortness of breath.   All other systems reviewed and are negative.    Allergies  Metoprolol  Home Medications   No current outpatient prescriptions on file. BP 155/82  Pulse 100  Temp(Src) 97.8 F (36.6 C) (Oral)  Resp 18  Ht 5\' 7"  (1.702 m)  Wt 142 lb 6.7 oz (64.6 kg)  BMI 22.30 kg/m2  SpO2 95% Physical Exam  Nursing note and vitals reviewed. Constitutional: He is oriented to person, place, and time. He appears  well-developed.  Elderly, frail  HENT:  Head: Normocephalic and atraumatic.  Right Ear: External ear normal.  Left Ear: External ear normal.  Eyes: Conjunctivae and EOM are normal. Pupils are equal, round, and reactive to light.  Neck: Normal range of motion and phonation normal. Neck supple.  Cardiovascular: Normal rate, regular rhythm, normal heart sounds and intact distal pulses.   Pulmonary/Chest: Effort normal. He exhibits no bony tenderness.  Decreased air movement, bilateral bases. No rales, rhonchi, or wheezes  Abdominal: Soft. Normal appearance. There is no tenderness.  Musculoskeletal: Normal range of motion.  Neurological: He is alert and oriented to person, place, and time. No cranial nerve deficit or sensory deficit. He exhibits normal muscle tone. Coordination normal.  Skin: Skin is warm, dry and intact.  There is a large bandage on the left lower leg. The bandage is removed revealing a 1.5 x 5 cm, superficial ulceration of the mid lateral calf. There is no associated fluctuance drainage or streaking. There is no calf tenderness. Homans sign is negative bilaterally.  Psychiatric: He has a normal mood and affect. His behavior is normal. Judgment and thought content normal.    ED Course  Procedures (including critical care time)  Medications  0.9 %  sodium chloride infusion (not administered)  antiseptic oral rinse (BIOTENE) solution 15 mL (not administered)  amLODipine (NORVASC) tablet 5 mg (not administered)  enalapril (VASOTEC) tablet 2.5 mg (not administered)  loratadine (CLARITIN) tablet 10 mg (not administered)  tamsulosin (FLOMAX) capsule 0.4 mg (not administered)  doxepin (SINEQUAN) capsule 25 mg (not administered)  sodium chloride (MURO 128) 2 % ophthalmic solution 1 drop (not administered)  beta carotene w/minerals (OCUVITE) tablet 1 tablet (not administered)  polyethylene glycol (MIRALAX / GLYCOLAX) packet 17 g (not administered)  senna-docusate (Senokot-S)  tablet 2 tablet (not administered)  aspirin chewable tablet 81 mg (not administered)  heparin injection 5,000 Units (not administered)  sodium chloride 0.9 % injection 3 mL (not administered)  sodium chloride 0.9 % injection 3 mL (not administered)  sodium chloride 0.9 % injection 3 mL (not administered)  0.9 %  sodium chloride infusion (not administered)  acetaminophen (TYLENOL) tablet 650 mg (not administered)    Or  acetaminophen (TYLENOL) suppository 650 mg (not administered)  ondansetron (ZOFRAN) tablet 4 mg (not administered)    Or  ondansetron (ZOFRAN) injection 4 mg (not administered)  furosemide (LASIX) injection 20 mg (not administered)  omega-3 acid ethyl esters (LOVAZA) capsule 1 g (not administered)  furosemide (LASIX) injection 20 mg (20 mg Intravenous Given 05/23/13 1409)  iohexol (OMNIPAQUE) 350 MG/ML injection 100 mL (100 mLs Intravenous Contrast Given 05/23/13 1414)    Patient Vitals for the past 24 hrs:  BP Temp Temp src Pulse Resp SpO2 Height Weight  05/23/13 1625 155/82 mmHg 97.8 F (36.6 C) - 100 18 95 % 5\' 7"  (1.702 m)  142 lb 6.7 oz (64.6 kg)  05/23/13 1505 169/90 mmHg - - 99 22 95 % - -  05/23/13 1340 - - - - - 100 % - -  05/23/13 1339 - - - 100 26 87 % - -  05/23/13 1213 - - - 97 17 96 % - -  05/23/13 1203 180/80 mmHg 97.4 F (36.3 C) Oral 96 24 93 % - -  05/23/13 1202 - - - - - 78 % - -     CRITICAL CARE Performed by: Mancel Bale L  ?  Total critical care time: 45 minutes Critical care time was exclusive of separately billable procedures and treating other patients.  Critical care was necessary to treat or prevent imminent or life-threatening deterioration.  Critical care was time spent personally by me on the following activities: development of treatment plan with patient and/or surrogate as well as nursing, discussions with consultants, evaluation of patient's response to treatment, examination of patient, obtaining history from patient or  surrogate, ordering and performing treatments and interventions, ordering and review of laboratory studies, ordering and review of radiographic studies, pulse oximetry and re-evaluation of patient's condition.   Labs Review Labs Reviewed  CBC WITH DIFFERENTIAL - Abnormal; Notable for the following:    RBC 4.05 (*)    MCH 34.1 (*)    Platelets 133 (*)    Neutrophils Relative % 79 (*)    Lymphocytes Relative 8 (*)    Lymphs Abs 0.4 (*)    All other components within normal limits  BASIC METABOLIC PANEL - Abnormal; Notable for the following:    Sodium 129 (*)    Chloride 92 (*)    BUN 25 (*)    GFR calc non Af Amer 59 (*)    GFR calc Af Amer 69 (*)    All other components within normal limits  PRO B NATRIURETIC PEPTIDE - Abnormal; Notable for the following:    Pro B Natriuretic peptide (BNP) 1995.0 (*)    All other components within normal limits  D-DIMER, QUANTITATIVE - Abnormal; Notable for the following:    D-Dimer, Quant 2.76 (*)    All other components within normal limits  URINALYSIS, ROUTINE W REFLEX MICROSCOPIC - Abnormal; Notable for the following:    Hgb urine dipstick TRACE (*)    All other components within normal limits  URINE CULTURE  TROPONIN I  URINE MICROSCOPIC-ADD ON  CBC  CREATININE, SERUM  CBC  BASIC METABOLIC PANEL  TROPONIN I  TROPONIN I  TROPONIN I  TSH  PHOSPHORUS  MAGNESIUM   Imaging Review Dg Chest 2 View  05/23/2013   CLINICAL DATA:  Wheezing and cough.  EXAM: CHEST  2 VIEW  COMPARISON:  07/17/2011  FINDINGS: Both costophrenic angles are blunted. Bilateral diffuse interstitial accentuation. Atherosclerotic aortic arch. Heart size within normal limits. Branching opacities are present in the right middle lobe and lingula with peripheral nodularity along the right middle lobe.  Chronic appearing anterior wedging at T11.  IMPRESSION: 1. Interstitial accentuation favoring interstitial edema or atypical pneumonia. 2. Small chronic bilateral pleural  effusions. 3. Bandlike opacities in the right middle lobe and lingula, favoring scarring or atelectasis over confluent edema or pneumonia.   Electronically Signed   By: Herbie Baltimore M.D.   On: 05/23/2013 13:10   Ct Angio Chest Pe W/cm &/or Wo Cm  05/23/2013   CLINICAL DATA:  Hypoxia.  EXAM: CT ANGIOGRAPHY CHEST WITH CONTRAST  TECHNIQUE: Multidetector CT imaging of the chest was performed using  the standard protocol during bolus administration of intravenous contrast. Multiplanar CT image reconstructions including MIPs were obtained to evaluate the vascular anatomy.  CONTRAST:  100mL OMNIPAQUE IOHEXOL 350 MG/ML SOLN  COMPARISON:  No priors.  FINDINGS: Mediastinum: No filling defects within the pulmonary arterial tree to suggest underlying pulmonary embolism. Heart size is mildly enlarged. There is no significant pericardial fluid, thickening or pericardial calcification. There is atherosclerosis of the thoracic aorta, the great vessels of the mediastinum and the coronary arteries, including calcified atherosclerotic plaque in the left main, left anterior descending, left circumflex and right coronary arteries. No pathologically enlarged mediastinal or hilar lymph nodes. Esophagus is unremarkable in appearance.  Lungs/Pleura: Moderate to large bilateral pleural effusions (right greater than left) with some associated passive atelectasis in the lower lobes of the lungs bilaterally. 1.7 x 1.0 cm pleural based nodule in the periphery of the right middle lobe (image 64 of series 7). There is a tiny focus of internal calcification within this lesion best demonstrated on image 66 of series 4. There may also be a in a pleural-based nodule in the left lower lobe measuring approximately 2.0 x 1.4 cm (image 88 of series 7), with some adjacent smaller micronodules as well. Background of mild centrilobular and paraseptal emphysema. Scattered areas of mild subpleural reticulation.  Upper Abdomen: Multiple calcifications in  the spleen likely represent calcified granulomas.  Musculoskeletal: Compression fracture of T10 with approximately 50% loss of anterior vertebral body height appears to be old. There are no aggressive appearing lytic or blastic lesions noted in the visualized portions of the skeleton.  Review of the MIP images confirms the above findings.  IMPRESSION: 1. No evidence of pulmonary embolism. 2. Nodules in the lungs bilaterally, largest of which measure 2.0 x 1.4 cm in the inferior aspect of the left lower lobe and 1.7 x 1.0 cm in the periphery of the right middle lobe. The right middle lobe nodule appears have a focus of internal calcification. Given the granulomatous changes in the spleen, there is a possibility that these nodules could be related to benign old granulomatous disease, however, this is a diagnosis of exclusion. In correlation with PET-CT may be warranted if clinically appropriate. Alternatively, follow-up chest CT in 3 months would be useful to assess for the stability or growth of these lesions. Malignancy is not excluded. 3. Moderate to large bilateral pleural effusions. 4. Chronic changes of emphysema in the lungs bilaterally, possibly with some early changes related interstitial lung disease, as discussed above. 5. Atherosclerosis, including left main and 3 vessel coronary artery disease.   Electronically Signed   By: Trudie Reedaniel  Entrikin M.D.   On: 05/23/2013 14:37    EKG Interpretation    Date/Time:  Sunday May 23 2013 12:08:55 EST Ventricular Rate:  97 PR Interval:  57 QRS Duration: 136 QT Interval:  405 QTC Calculation: 514 R Axis:   -69 Text Interpretation:  Sinus rhythm Short PR interval RBBB and LAFB Since last tracing Sinus rhythm has replaced Atrial Fibrillation Confirmed by Effie ShyWENTZ  MD, Bensen Chadderdon ([id number]) on 05/23/2013 12:44:39 PM            MDM   1. Congestive heart failure (CHF), acute on chronic, systolic   2. Lung nodules   3. Hypoxia    Exacerbation of congestive  heart failure, without evidence for acute coronary syndrome, PE, or pneumonia. Patient is hypoxic. He'll need to be admitted for stabilization.  Nursing Notes Reviewed/ Care Coordinated, and agree without changes. Applicable Imaging Reviewed.  Interpretation of Laboratory Data incorporated into ED treatment  Plan: Admit  Flint Melter, MD 05/23/13 1805

## 2013-05-23 NOTE — ED Notes (Signed)
MD Wentz at bedside.  

## 2013-05-23 NOTE — ED Notes (Signed)
Pt transported to CT ?

## 2013-05-23 NOTE — H&P (Signed)
[facility] History and Physical  [name] WUJ:[id number] DOB: 1912-02-03 DOA: 05/23/2013  Referring physician: Dr. Effie Shy PCP: Geoffrey Relic, MD   Chief Complaint: Hypoxia on room air  HPI: [name] is a 80 y.o. male  With history of MI in 1976, heart failure, and hypertension. Presenting to the ED complaining of shortness of breath. The patient states this is been a problem for the last 3 days. He currently denies any chest discomfort. Denies any palpitations. Denies any recent recent sick contacts. Has also had a dry cough. Denies any lower extremity swelling that he is aware of. Also denies hemoptysis. Dyspnea is worse at night and reportedly improves when standing. The problem since onset has gotten worse and has persisted and as a result patient presented to the ED for further evaluation.  While in the ED the patient was found to have elevated BNP and hypoxic on room air. Patient was given Lasix and we were consult at for further evaluation and recommendations. Given elevated d-dimer CT angiogram was obtained which reported no evidence of pulmonary embolism which also make mention of pulmonary nodules requiring followup CT scan in 3 months and also reporting moderate to large bilateral pleural effusions   Review of Systems:  Constitutional:  No weight loss, night sweats, Fevers, chills, fatigue.  HEENT:  No headaches, Difficulty swallowing,Tooth/dental problems,Sore throat,  No sneezing, itching, ear ache, nasal congestion, post nasal drip,  Cardio-vascular:   Orthopnea, PND, swelling in lower extremities, anasarca, dizziness, palpitations  GI:  No heartburn, indigestion, abdominal pain, nausea, vomiting, diarrhea, change in bowel habits, loss of appetite  Resp:  + shortness of breath with exertion or at rest. No excess mucus, + none productive cough,No coughing up of blood.No change in color of mucus.No wheezing.No chest wall deformity  Skin:  + Lesion at  left lower leg GU:  no dysuria, change in color of urine, no urgency or frequency. No flank pain.  Musculoskeletal:  No joint pain or swelling. No decreased range of motion. No back pain.  Psych:  No change in mood or affect. No depression or anxiety. No memory loss.   Past Medical History  Diagnosis Date  . Atrial fibrillation     Not on Coumadin due to recurrent, severe GIB  . H/O: GI bleed   . Inguinal hernia   . Spinal stenosis     S/p surgery   . MI, old 80  . Diverticulitis   . BPH (benign prostatic hyperplasia)   . Anemia   . Arthritis   . Blood transfusion   . CHF (congestive heart failure)   . Hypertension   . Heart attack 1976  . Unspecified hereditary and idiopathic peripheral neuropathy 03/25/2012  . Corns and callosities 12/18/2011  . Pain in joint, ankle and foot 11/20/2011  . Chronic airway obstruction, not elsewhere classified 07/17/2011  . Other dyspnea and respiratory abnormality 07/17/2011  . Other specified cardiac dysrhythmias(427.89) 06/04/2011  . Typhoid fever 05/29/2011  . Macular degeneration (senile) of retina, unspecified 05/29/2011  . Senile cataract, unspecified 05/29/2011  . Pericarditis, acute 05/29/2011  . Acute systolic heart failure 05/29/2011  . Unspecified venous (peripheral) insufficiency 05/29/2011  . Duodenal ulcer 05/29/2011  . Osteoarthrosis, unspecified whether generalized or localized, pelvic region and thigh 05/29/2011  . Other and unspecified disc disorder of lumbar region 05/29/2011  . Spasm of muscle 05/29/2011  . Abnormality of gait 05/29/2011  . Closed fracture of pubis 05/29/2011  . Transient ischemic attack (TIA), and  cerebral infarction without residual deficits(V12.54) 2013  . Coronary atherosclerosis of native coronary artery 2013  . Pneumonia, organism unspecified 2013  . Unspecified constipation 2013  . Debility, unspecified 05/24/2011  . Unspecified essential hypertension 09/23/2012   Past Surgical History  Procedure Laterality Date  . Spine  surgery      Spinal stenosis surgery with Dr. Darrelyn Hillock   . Cholecystectomy    . Total hip arthroplasty  2002    Right   . Vein surgery  2007    leg  . Inguinal hernia repair  2003    RIH  . Appendectomy    . Tonsillectomy and adenoidectomy     Social History:  reports that he quit smoking about 39 years ago. He has never used smokeless tobacco. He reports that he drinks alcohol. He reports that he does not use illicit drugs.  Allergies  Allergen Reactions  . Metoprolol     "dementia" "lost touch with reality per Daughter"    Family History  Problem Relation Age of Onset  . Heart attack Father   . Heart disease Father   . Rheumatologic disease Mother   . Cancer Sister     colon  . Cancer Sister     lung     Prior to Admission medications   Medication Sig Start Date End Date Taking? Authorizing Provider  amLODipine (NORVASC) 5 MG tablet Take 5 mg by mouth daily.   Yes Historical Provider, MD  aspirin 81 MG tablet Take 81 mg by mouth 3 (three) times a week.     Yes Historical Provider, MD  beta carotene w/minerals (OCUVITE) tablet Take 1 tablet by mouth daily. For eyes   Yes Historical Provider, MD  doxepin (SINEQUAN) 25 MG capsule Take 1 capsule (25 mg total) by mouth at bedtime. 12/08/12  Yes Cassell Clement, MD  enalapril (VASOTEC) 2.5 MG tablet Take 2.5 mg by mouth daily.   Yes Historical Provider, MD  fish oil-omega-3 fatty acids 1000 MG capsule Take 1 g by mouth daily.    Yes Historical Provider, MD  furosemide (LASIX) 20 MG tablet Take 1 tablet (20 mg total) by mouth daily. 11/03/12  Yes Cassell Clement, MD  loratadine (CLARITIN) 10 MG tablet Take 10 mg by mouth daily.   Yes Historical Provider, MD  Multiple Vitamin (MULTIVITAMIN) tablet Take 1 tablet by mouth daily.     Yes Historical Provider, MD  polyethylene glycol (MIRALAX / GLYCOLAX) packet Take 17 g by mouth daily. Mix in 6 oz beverage in morin ing to relieve constipation   Yes Historical Provider, MD   senna-docusate (SENOKOT S) 8.6-50 MG per tablet Take 2 tablets by mouth. Nightly for stool softener and laxative, as needed   Yes Historical Provider, MD  sodium chloride (MURO 128) 2 % ophthalmic solution Place 1 drop into the left eye 2 (two) times daily.   Yes Historical Provider, MD  tamsulosin (FLOMAX) 0.4 MG CAPS capsule Take 0.4 mg by mouth daily after breakfast.   Yes Historical Provider, MD   Physical Exam: Filed Vitals:   05/23/13 1625  BP: 155/82  Pulse: 100  Temp: 97.8 F (36.6 C)  Resp: 18    BP 155/82  Pulse 100  Temp(Src) 97.8 F (36.6 C) (Oral)  Resp 18  Ht 5\' 7"  (1.702 m)  Wt 64.6 kg (142 lb 6.7 oz)  BMI 22.30 kg/m2  SpO2 95%  General:  Appears calm and comfortable Eyes: PERRL, normal lids, irises & conjunctiva ENT: grossly normal hearing,  lips & tongue Neck: no LAD, masses or thyromegaly Cardiovascular: RRR, no m/r/g. No LE edema. Telemetry: SR, no arrhythmias  Respiratory: CTA bilaterally, no w/r/r. Normal respiratory effort. Abdomen: soft, ntnd Skin: no rash or induration seen on limited exam Musculoskeletal: grossly normal tone BUE/BLE Psychiatric: grossly normal mood and affect, speech fluent and appropriate Neurologic: grossly non-focal.          Labs on Admission:  Basic Metabolic Panel:  Recent Labs Lab 05/23/13 1245  NA 129*  K 4.8  CL 92*  CO2 22  GLUCOSE 92  BUN 25*  CREATININE 1.01  CALCIUM 9.1   Liver Function Tests: No results found for this basename: AST, ALT, ALKPHOS, BILITOT, PROT, ALBUMIN,  in the last 168 hours No results found for this basename: LIPASE, AMYLASE,  in the last 168 hours No results found for this basename: AMMONIA,  in the last 168 hours CBC:  Recent Labs Lab 05/23/13 1245  WBC 5.7  NEUTROABS 4.5  HGB 13.8  HCT 39.1  MCV 96.5  PLT 133*   Cardiac Enzymes:  Recent Labs Lab 05/23/13 1245  TROPONINI <0.30    BNP (last 3 results)  Recent Labs  05/23/13 1245  PROBNP 1995.0*   CBG: No  results found for this basename: GLUCAP,  in the last 168 hours  Radiological Exams on Admission: Dg Chest 2 View  05/23/2013   CLINICAL DATA:  Wheezing and cough.  EXAM: CHEST  2 VIEW  COMPARISON:  07/17/2011  FINDINGS: Both costophrenic angles are blunted. Bilateral diffuse interstitial accentuation. Atherosclerotic aortic arch. Heart size within normal limits. Branching opacities are present in the right middle lobe and lingula with peripheral nodularity along the right middle lobe.  Chronic appearing anterior wedging at T11.  IMPRESSION: 1. Interstitial accentuation favoring interstitial edema or atypical pneumonia. 2. Small chronic bilateral pleural effusions. 3. Bandlike opacities in the right middle lobe and lingula, favoring scarring or atelectasis over confluent edema or pneumonia.   Electronically Signed   By: Herbie Baltimore M.D.   On: 05/23/2013 13:10   Ct Angio Chest Pe W/cm &/or Wo Cm  05/23/2013   CLINICAL DATA:  Hypoxia.  EXAM: CT ANGIOGRAPHY CHEST WITH CONTRAST  TECHNIQUE: Multidetector CT imaging of the chest was performed using the standard protocol during bolus administration of intravenous contrast. Multiplanar CT image reconstructions including MIPs were obtained to evaluate the vascular anatomy.  CONTRAST:  OMNIPAQUE IOHEXOL 350 MG/ML SOLN  COMPARISON:  No priors.  FINDINGS: Mediastinum: No filling defects within the pulmonary arterial tree to suggest underlying pulmonary embolism. Heart size is mildly enlarged. There is no significant pericardial fluid, thickening or pericardial calcification. There is atherosclerosis of the thoracic aorta, the great vessels of the mediastinum and the coronary arteries, including calcified atherosclerotic plaque in the left main, left anterior descending, left circumflex and right coronary arteries. No pathologically enlarged mediastinal or hilar lymph nodes. Esophagus is unremarkable in appearance.  Lungs/Pleura: Moderate to large bilateral  pleural effusions (right greater than left) with some associated passive atelectasis in the lower lobes of the lungs bilaterally. 1.7 x 1.0 cm pleural based nodule in the periphery of the right middle lobe (image 64 of series 7). There is a tiny focus of internal calcification within this lesion best demonstrated on image 66 of series 4. There may also be a in a pleural-based nodule in the left lower lobe measuring approximately 2.0 x 1.4 cm (image 88 of series 7), with some adjacent smaller micronodules as well. Background  of mild centrilobular and paraseptal emphysema. Scattered areas of mild subpleural reticulation.  Upper Abdomen: Multiple calcifications in the spleen likely represent calcified granulomas.  Musculoskeletal: Compression fracture of T10 with approximately 50% loss of anterior vertebral body height appears to be old. There are no aggressive appearing lytic or blastic lesions noted in the visualized portions of the skeleton.  Review of the MIP images confirms the above findings.  IMPRESSION: 1. No evidence of pulmonary embolism. 2. Nodules in the lungs bilaterally, largest of which measure 2.0 x 1.4 cm in the inferior aspect of the left lower lobe and 1.7 x 1.0 cm in the periphery of the right middle lobe. The right middle lobe nodule appears have a focus of internal calcification. Given the granulomatous changes in the spleen, there is a possibility that these nodules could be related to benign old granulomatous disease, however, this is a diagnosis of exclusion. In correlation with PET-CT may be warranted if clinically appropriate. Alternatively, follow-up chest CT in 3 months would be useful to assess for the stability or growth of these lesions. Malignancy is not excluded. 3. Moderate to large bilateral pleural effusions. 4. Chronic changes of emphysema in the lungs bilaterally, possibly with some early changes related interstitial lung disease, as discussed above. 5. Atherosclerosis, including  left main and 3 vessel coronary artery disease.   Electronically Signed   By: Trudie Reedaniel  Entrikin M.D.   On: 05/23/2013 14:37    EKG: Independently reviewed. Sinus rhythm with artifact. No st elevations or depressions  Assessment/Plan Principal Problem:   Respiratory failure with hypoxia - Presumably secondary to congestive heart failure most likely diastolic, we'll obtain echocardiogram - Monitor on telemetry - Supportive therapy - Will obtain phosphorus and magnesium as well as TSH levels. - Treat with Lasix 20 mg IV twice a day  Active Problems:   Atrial fibrillation    Chronic ischemic heart disease - Stable continue omega-3 oil    Unspecified essential hypertension - We'll plan on continuing amlodipine and enalapril - Reportedly the nurse metoprolol was documented as allergy    Congestive heart failure - Most likely diastolic given normal EF in 2012. - Please see above  - Cycle troponins    Leg wound, left   Code Status: DNR Family Communication:DIscussed directly with patient. Disposition Plan: Pending further evaluation. Once breathing to baseline may discharge  Time spent: > 50 minutes  Penny PiaVEGA, Odeal Welden [facility] Pager [phone]

## 2013-05-24 DIAGNOSIS — I059 Rheumatic mitral valve disease, unspecified: Secondary | ICD-10-CM

## 2013-05-24 DIAGNOSIS — J189 Pneumonia, unspecified organism: Secondary | ICD-10-CM

## 2013-05-24 DIAGNOSIS — I5021 Acute systolic (congestive) heart failure: Secondary | ICD-10-CM

## 2013-05-24 LAB — CBC
HEMATOCRIT: 35 % — AB (ref 39.0–52.0)
HEMOGLOBIN: 12.3 g/dL — AB (ref 13.0–17.0)
MCH: 33.7 pg (ref 26.0–34.0)
MCHC: 35.1 g/dL (ref 30.0–36.0)
MCV: 95.9 fL (ref 78.0–100.0)
Platelets: 118 10*3/uL — ABNORMAL LOW (ref 150–400)
RBC: 3.65 MIL/uL — AB (ref 4.22–5.81)
RDW: 15.1 % (ref 11.5–15.5)
WBC: 5.1 10*3/uL (ref 4.0–10.5)

## 2013-05-24 LAB — URINE CULTURE
COLONY COUNT: NO GROWTH
Culture: NO GROWTH

## 2013-05-24 LAB — BASIC METABOLIC PANEL
BUN: 27 mg/dL — AB (ref 6–23)
CO2: 24 mEq/L (ref 19–32)
Calcium: 8.5 mg/dL (ref 8.4–10.5)
Chloride: 93 mEq/L — ABNORMAL LOW (ref 96–112)
Creatinine, Ser: 1.12 mg/dL (ref 0.50–1.35)
GFR calc Af Amer: 61 mL/min — ABNORMAL LOW (ref >90)
GFR calc non Af Amer: 52 mL/min — ABNORMAL LOW (ref >90)
GLUCOSE: 89 mg/dL (ref 70–99)
Potassium: 4.1 mEq/L (ref 3.7–5.3)
Sodium: 130 mEq/L — ABNORMAL LOW (ref 137–147)

## 2013-05-24 LAB — TROPONIN I: Troponin I: <0.3 ng/mL (ref <0.30)

## 2013-05-24 LAB — TSH: TSH: 1.727 u[IU]/mL (ref 0.350–4.500)

## 2013-05-24 MED ORDER — COLLAGENASE 250 UNIT/GM EX OINT
TOPICAL_OINTMENT | Freq: Every day | CUTANEOUS | Status: DC
  Filled 2013-05-24: qty 30

## 2013-05-24 MED ORDER — ALBUTEROL SULFATE HFA 108 (90 BASE) MCG/ACT IN AERS
2.0000 | INHALATION_SPRAY | RESPIRATORY_TRACT | Status: DC | PRN
  Administered 2013-05-24: 05:00:00 2 via RESPIRATORY_TRACT
  Filled 2013-05-24: qty 6.7

## 2013-05-24 MED ORDER — COLLAGENASE 250 UNIT/GM EX OINT
TOPICAL_OINTMENT | CUTANEOUS | Status: DC
  Administered 2013-05-24 – 2013-05-26 (×2): via TOPICAL
  Filled 2013-05-24: qty 30

## 2013-05-24 NOTE — Care Management Note (Addendum)
    Page 1 of 1   05/26/2013     3:22:57 PM   CARE MANAGEMENT NOTE 05/26/2013  Patient:  [name], [name]   Account Number:  [id number]  Date Initiated:  05/24/2013  Documentation initiated by:  [facility]  Subjective/Objective Assessment:   80 Y/O M ADMITTED W/RESP FAILURE.     Action/Plan:   FROM [facility],RW.   Anticipated DC Date:  05/26/2013   Anticipated DC Plan:  SKILLED NURSING FACILITY      DC Planning Services  CM consult      Choice offered to / List presented to:             Status of service:  Completed, signed off Medicare Important Message given?   (If response is "NO", the following Medicare IM given date fields will be blank) Date Medicare IM given:   Date Additional Medicare IM given:    Discharge Disposition:  SKILLED NURSING FACILITY  Per UR Regulation:  Reviewed for med. necessity/level of care/duration of stay  If discussed at Long Length of Stay Meetings, dates discussed:    Comments:  05/26/13 Marce Charlesworth RN,BSN NCM [phone] D/C SNF.  05/24/13 Rosenda Geffrard RN,BSN NCM [phone] WOULD RECOMMEND PT/OT CONS.L CALF WOUND-WOC FOLLOWING.

## 2013-05-24 NOTE — Progress Notes (Signed)
[facility] PROGRESS NOTE  OLIVIER FRAYRE MVH:[id number] DOB: [birth year] DOA: 05/23/2013 PCP: Kimber Relic, MD  Assessment/Plan: Principal Problem:   Respiratory failure with hypoxia  - Presumably secondary to congestive heart failure most likely diastolic. - Supportive therapy  - Phosphorus and magnesium levels within normal limits. TSH WNL as well - Continue IV Lasix may transition to oral regimen next a.m.  Active Problems:    Chronic ischemic heart disease  - Stable continue omega-3 oil   Unspecified essential hypertension  - We'll plan on continuing amlodipine and enalapril  - Per discussion with nurse patient has allergy to metoprolol  Congestive heart failure  - Most likely diastolic given normal EF in 2012.  - Awaiting Echocardiogram results - troponins negative x 3  Leg wound, left - Wound nurse consulted.  Code Status: DNR Family Communication: Discussed with Son Disposition Plan: With continued improvement in respiratory condition.    Consultants:  None  Procedures:  None  Antibiotics:  None  HPI/Subjective: No new complaints no acute issues overnight. Patient reports feeling better  Objective: Filed Vitals:   05/24/13 1341  BP: 125/53  Pulse: 82  Temp: 97.4 F (36.3 C)  Resp: 18    Intake/Output Summary (Last 24 hours) at 05/24/13 1530 Last data filed at 05/24/13 1343  Gross per 24 hour  Intake    360 ml  Output   3075 ml  Net  -2715 ml   Filed Weights   05/23/13 1625 05/24/13 0506  Weight: 64.6 kg (142 lb 6.7 oz) 63.2 kg (139 lb 5.3 oz)    Exam:   General:  Pt in NAD, Alert and Awake  Cardiovascular: RRR, no MRG  Respiratory: No wheezes, [place] in place, speaking in full sentences  Abdomen: soft, NT, ND  Musculoskeletal: no cyanosis or clubbing.   Data Reviewed: Basic Metabolic Panel:  Recent Labs Lab 05/23/13 1245 05/23/13 1825 05/24/13 0542  NA 129*  --  130*  K 4.8  --  4.1  CL 92*  --  93*  CO2 22   --  24  GLUCOSE 92  --  89  BUN 25*  --  27*  CREATININE 1.01  --  1.12  CALCIUM 9.1  --  8.5  MG  --  2.0  --   PHOS  --  2.8  --    Liver Function Tests: No results found for this basename: AST, ALT, ALKPHOS, BILITOT, PROT, ALBUMIN,  in the last 168 hours No results found for this basename: LIPASE, AMYLASE,  in the last 168 hours No results found for this basename: AMMONIA,  in the last 168 hours CBC:  Recent Labs Lab 05/23/13 1245 05/24/13 0542  WBC 5.7 5.1  NEUTROABS 4.5  --   HGB 13.8 12.3*  HCT 39.1 35.0*  MCV 96.5 95.9  PLT 133* 118*   Cardiac Enzymes:  Recent Labs Lab 05/23/13 1245 05/23/13 1825 05/23/13 2347 05/24/13 0542  TROPONINI <0.30 <0.30 <0.30 <0.30   BNP (last 3 results)  Recent Labs  05/23/13 1245  PROBNP 1995.0*   CBG: No results found for this basename: GLUCAP,  in the last 168 hours  No results found for this or any previous visit (from the past 240 hour(s)).   Studies: Dg Chest 2 View  05/23/2013   CLINICAL DATA:  Wheezing and cough.  EXAM: CHEST  2 VIEW  COMPARISON:  07/17/2011  FINDINGS: Both costophrenic angles are blunted. Bilateral diffuse interstitial accentuation. Atherosclerotic aortic arch. Heart size within  normal limits. Branching opacities are present in the right middle lobe and lingula with peripheral nodularity along the right middle lobe.  Chronic appearing anterior wedging at T11.  IMPRESSION: 1. Interstitial accentuation favoring interstitial edema or atypical pneumonia. 2. Small chronic bilateral pleural effusions. 3. Bandlike opacities in the right middle lobe and lingula, favoring scarring or atelectasis over confluent edema or pneumonia.   Electronically Signed   By: Herbie BaltimoreWalt  Liebkemann M.D.   On: 05/23/2013 13:10   Ct Angio Chest Pe W/cm &/or Wo Cm  05/23/2013   CLINICAL DATA:  Hypoxia.  EXAM: CT ANGIOGRAPHY CHEST WITH CONTRAST  TECHNIQUE: Multidetector CT imaging of the chest was performed using the standard protocol during  bolus administration of intravenous contrast. Multiplanar CT image reconstructions including MIPs were obtained to evaluate the vascular anatomy.  CONTRAST:  100mL OMNIPAQUE IOHEXOL 350 MG/ML SOLN  COMPARISON:  No priors.  FINDINGS: Mediastinum: No filling defects within the pulmonary arterial tree to suggest underlying pulmonary embolism. Heart size is mildly enlarged. There is no significant pericardial fluid, thickening or pericardial calcification. There is atherosclerosis of the thoracic aorta, the great vessels of the mediastinum and the coronary arteries, including calcified atherosclerotic plaque in the left main, left anterior descending, left circumflex and right coronary arteries. No pathologically enlarged mediastinal or hilar lymph nodes. Esophagus is unremarkable in appearance.  Lungs/Pleura: Moderate to large bilateral pleural effusions (right greater than left) with some associated passive atelectasis in the lower lobes of the lungs bilaterally. 1.7 x 1.0 cm pleural based nodule in the periphery of the right middle lobe (image 64 of series 7). There is a tiny focus of internal calcification within this lesion best demonstrated on image 66 of series 4. There may also be a in a pleural-based nodule in the left lower lobe measuring approximately 2.0 x 1.4 cm (image 88 of series 7), with some adjacent smaller micronodules as well. Background of mild centrilobular and paraseptal emphysema. Scattered areas of mild subpleural reticulation.  Upper Abdomen: Multiple calcifications in the spleen likely represent calcified granulomas.  Musculoskeletal: Compression fracture of T10 with approximately 50% loss of anterior vertebral body height appears to be old. There are no aggressive appearing lytic or blastic lesions noted in the visualized portions of the skeleton.  Review of the MIP images confirms the above findings.  IMPRESSION: 1. No evidence of pulmonary embolism. 2. Nodules in the lungs bilaterally,  largest of which measure 2.0 x 1.4 cm in the inferior aspect of the left lower lobe and 1.7 x 1.0 cm in the periphery of the right middle lobe. The right middle lobe nodule appears have a focus of internal calcification. Given the granulomatous changes in the spleen, there is a possibility that these nodules could be related to benign old granulomatous disease, however, this is a diagnosis of exclusion. In correlation with PET-CT may be warranted if clinically appropriate. Alternatively, follow-up chest CT in 3 months would be useful to assess for the stability or growth of these lesions. Malignancy is not excluded. 3. Moderate to large bilateral pleural effusions. 4. Chronic changes of emphysema in the lungs bilaterally, possibly with some early changes related interstitial lung disease, as discussed above. 5. Atherosclerosis, including left main and 3 vessel coronary artery disease.   Electronically Signed   By: Trudie Reedaniel  Entrikin M.D.   On: 05/23/2013 14:37    Scheduled Meds: . sodium chloride   Intravenous STAT  . amLODipine  5 mg Oral Daily  . antiseptic oral rinse  15  mL Mouth Rinse BID  . aspirin  81 mg Oral 3 times weekly  . beta carotene w/minerals  1 tablet Oral Daily  . collagenase   Topical 3 times weekly  . doxepin  25 mg Oral QHS  . enalapril  2.5 mg Oral Daily  . furosemide  20 mg Intravenous BID  . heparin  5,000 Units Subcutaneous Q8H  . loratadine  10 mg Oral Daily  . omega-3 acid ethyl esters  1 g Oral Daily  . polyethylene glycol  17 g Oral Daily  . sodium chloride  1 drop Left Eye BID  . sodium chloride  3 mL Intravenous Q12H  . sodium chloride  3 mL Intravenous Q12H  . tamsulosin  0.4 mg Oral QPC breakfast   Continuous Infusions:   Principal Problem:   Respiratory failure with hypoxia Active Problems:   Atrial fibrillation   Chronic ischemic heart disease   Unspecified essential hypertension   Congestive heart failure   Leg wound, left    Time spent: > 35  minutes    Penny Pia  [facility] Pager [phone] If 7PM-7AM, please contact night-coverage at [email or web address], password [facility] 05/24/2013, 3:30 PM  LOS: 1 day

## 2013-05-24 NOTE — Consult Note (Signed)
WOC wound consult note Reason for Consult: Trauma wound to left dorsal calf from car door.  Duration 2 months.   Wound type: Trauma Measurement: 6 cm x 2 cm, unable to visualize wound bed, adherent scab that has loosened on the edges to reveal thick, purulent drainage.  Wound bed: 100% devitalized scab. Drainage (amount, consistency, odor) Minimal purulent, thick and yellow.  No odor.  Periwound: Erythematous.  Dressing procedure/placement/frequency: Cleanse wound with NS and pat gently dry.  Apply santyl ointment, 1/8 inch thickness to wound bed to soften scab and promote healing.  Top with vaseline gauze to keep moist and promote autolysis.   Cover with wrap gauze and secure with [place]. Begin just below toes and wrap up leg to just below knees.  Patient wears compression garments at home as needed for edema.  Right extremity has no edema present. Change three times weekly.   WOC nursing will not follow at this time.  Please re-consult if needed.  Maple Hudson RN BSN [facility] Pager [phone]

## 2013-05-24 NOTE — Progress Notes (Signed)
  Echocardiogram 2D Echocardiogram has been performed.  Cathie Beams 05/24/2013, 2:29 PM

## 2013-05-24 NOTE — Progress Notes (Signed)
SATURATION QUALIFICATIONS: (This note is used to comply with regulatory documentation for home oxygen)  Patient Saturations on Room Air at Rest = 89-93%  Patient Saturations on Room Air while Ambulating = 79%  Patient Saturations on 2 Liters of oxygen while Ambulating = 88-92%  Please briefly explain why patient needs home oxygen:  Oxygen saturation dropped immediately upon any exertion to 79%.  At rest, pt's sat with O2 increased to 95%.  At complete rest pt can maintain saturation of 90-93% on RA.  Nino Parsley

## 2013-05-25 DIAGNOSIS — R918 Other nonspecific abnormal finding of lung field: Secondary | ICD-10-CM

## 2013-05-25 DIAGNOSIS — I4891 Unspecified atrial fibrillation: Secondary | ICD-10-CM

## 2013-05-25 DIAGNOSIS — L0291 Cutaneous abscess, unspecified: Secondary | ICD-10-CM

## 2013-05-25 DIAGNOSIS — N400 Enlarged prostate without lower urinary tract symptoms: Secondary | ICD-10-CM

## 2013-05-25 DIAGNOSIS — J8409 Other alveolar and parieto-alveolar conditions: Secondary | ICD-10-CM

## 2013-05-25 DIAGNOSIS — L0390 Cellulitis, unspecified: Secondary | ICD-10-CM

## 2013-05-25 MED ORDER — VITAMINS A & D EX OINT
TOPICAL_OINTMENT | CUTANEOUS | Status: AC
  Administered 2013-05-25: 5
  Filled 2013-05-25: qty 5

## 2013-05-25 MED ORDER — FUROSEMIDE 10 MG/ML IJ SOLN
40.0000 mg | Freq: Two times a day (BID) | INTRAMUSCULAR | Status: DC
  Administered 2013-05-25 – 2013-05-26 (×2): 40 mg via INTRAVENOUS
  Filled 2013-05-25 (×3): qty 4

## 2013-05-25 NOTE — Progress Notes (Signed)
[facility] PROGRESS NOTE  [name] AUQ:[id number] DOB: [birth year] DOA: 05/23/2013 PCP: Kimber Relic, MD  Assessment/Plan: Principal Problem:   Respiratory failure with hypoxia  - Presumably secondary to congestive heart failure most likely diastolic. - Supportive therapy  - Phosphorus and magnesium levels within normal limits. TSH WNL as well - Increase IV Lasix dose to 40 mg IV BID given persistent hypoxia  Active Problems:    Chronic ischemic heart disease  - Stable continue omega-3 oil   Unspecified essential hypertension  - We'll plan on continuing amlodipine and enalapril  - Per discussion with nurse patient has allergy to metoprolol  Congestive heart failure  - EF unchanged when compared to last - troponins negative x 3 - Increasing IV lasix, daily weights, monitor strict I/O's. Not much net loss when compared to prior value.  Leg wound, left - Wound nurse consulted.  Code Status: DNR Family Communication: Discussed with Son Disposition Plan: With continued improvement in respiratory condition.    Consultants:  None  Procedures:  None  Antibiotics:  None  HPI/Subjective: Patient feels about the same. No acute issues reported overnight.  Objective: Filed Vitals:   05/25/13 1315  BP: 116/61  Pulse: 111  Temp: 97.2 F (36.2 C)  Resp: 16    Intake/Output Summary (Last 24 hours) at 05/25/13 1635 Last data filed at 05/25/13 1320  Gross per 24 hour  Intake    960 ml  Output   1300 ml  Net   -340 ml   Filed Weights   05/23/13 1625 05/24/13 0506 05/25/13 0454  Weight: 64.6 kg (142 lb 6.7 oz) 63.2 kg (139 lb 5.3 oz) 62.5 kg (137 lb 12.6 oz)    Exam:   General:  Pt in NAD, Alert and Awake  Cardiovascular: RRR, no MRG  Respiratory: No wheezes, [place] in place, speaking in full sentences  Abdomen: soft, NT, ND  Musculoskeletal: no cyanosis or clubbing.   Data Reviewed: Basic Metabolic Panel:  Recent Labs Lab  05/23/13 1245 05/23/13 1825 05/24/13 0542  NA 129*  --  130*  K 4.8  --  4.1  CL 92*  --  93*  CO2 22  --  24  GLUCOSE 92  --  89  BUN 25*  --  27*  CREATININE 1.01  --  1.12  CALCIUM 9.1  --  8.5  MG  --  2.0  --   PHOS  --  2.8  --    Liver Function Tests: No results found for this basename: AST, ALT, ALKPHOS, BILITOT, PROT, ALBUMIN,  in the last 168 hours No results found for this basename: LIPASE, AMYLASE,  in the last 168 hours No results found for this basename: AMMONIA,  in the last 168 hours CBC:  Recent Labs Lab 05/23/13 1245 05/24/13 0542  WBC 5.7 5.1  NEUTROABS 4.5  --   HGB 13.8 12.3*  HCT 39.1 35.0*  MCV 96.5 95.9  PLT 133* 118*   Cardiac Enzymes:  Recent Labs Lab 05/23/13 1245 05/23/13 1825 05/23/13 2347 05/24/13 0542  TROPONINI <0.30 <0.30 <0.30 <0.30   BNP (last 3 results)  Recent Labs  05/23/13 1245  PROBNP 1995.0*   CBG: No results found for this basename: GLUCAP,  in the last 168 hours  Recent Results (from the past 240 hour(s))  URINE CULTURE     Status: None   Collection Time    05/23/13  3:56 PM      Result Value Range Status  Specimen Description URINE, CATHETERIZED   Final   Special Requests NONE   Final   Culture  Setup Time     Final   Value: 05/23/2013 21:51     Performed at [facility]   Colony Count     Final   Value: NO GROWTH     Performed at [facility]   Culture     Final   Value: NO GROWTH     Performed at [facility]   Report Status 05/24/2013 FINAL   Final     Studies: No results found.  Scheduled Meds: . amLODipine  5 mg Oral Daily  . antiseptic oral rinse  15 mL Mouth Rinse BID  . aspirin  81 mg Oral 3 times weekly  . beta carotene w/minerals  1 tablet Oral Daily  . collagenase   Topical 3 times weekly  . doxepin  25 mg Oral QHS  . enalapril  2.5 mg Oral Daily  . furosemide  40 mg Intravenous BID  . heparin  5,000 Units Subcutaneous Q8H  . loratadine  10 mg Oral Daily   . omega-3 acid ethyl esters  1 g Oral Daily  . polyethylene glycol  17 g Oral Daily  . sodium chloride  1 drop Left Eye BID  . sodium chloride  3 mL Intravenous Q12H  . sodium chloride  3 mL Intravenous Q12H  . tamsulosin  0.4 mg Oral QPC breakfast   Continuous Infusions:   Principal Problem:   Respiratory failure with hypoxia Active Problems:   Atrial fibrillation   Chronic ischemic heart disease   Unspecified essential hypertension   Congestive heart failure   Leg wound, left    Time spent: > 35 minutes    [name], [name]  [facility] Pager [phone] If 7PM-7AM, please contact night-coverage at [email or web address], password [email or web address] 05/25/2013, 4:35 PM  LOS: 2 days

## 2013-05-26 LAB — BASIC METABOLIC PANEL
BUN: 35 mg/dL — ABNORMAL HIGH (ref 6–23)
CALCIUM: 8.8 mg/dL (ref 8.4–10.5)
CO2: 27 meq/L (ref 19–32)
Chloride: 95 mEq/L — ABNORMAL LOW (ref 96–112)
Creatinine, Ser: 1.28 mg/dL (ref 0.50–1.35)
GFR calc non Af Amer: 44 mL/min — ABNORMAL LOW (ref >90)
GFR, EST AFRICAN AMERICAN: 52 mL/min — AB (ref >90)
Glucose, Bld: 94 mg/dL (ref 70–99)
Potassium: 4 mEq/L (ref 3.7–5.3)
SODIUM: 134 meq/L — AB (ref 137–147)

## 2013-05-26 MED ORDER — FUROSEMIDE 40 MG PO TABS: 40.0000 mg | ORAL_TABLET | Freq: Every day | ORAL | Status: DC

## 2013-05-26 NOTE — Discharge Summary (Addendum)
Physician Discharge Summary  [name] ZOX:[id number] DOB: [birth year] DOA: 05/23/2013  PCP: Kimber Relic, MD  Admit date: 05/23/2013 Discharge date: 05/26/2013  Time spent: > 35  minutes  Recommendations for Outpatient Follow-up:  1. Please be sure to follow up with your primary care physician at skilled nursing facility as they may want to further modify your diuretics given your recent diastolic CHF exacerbation. 2. Would recommend continued monitoring of ins and outs and daily weights while at skilled nursing facility 3. Continue monitoring of sodium levels 4. Continue routine wound care for left lower extremity wound 5. I have increased Lasix dose on discharge from 20 to 40 please reassess serum creatinine levels 6. Addendum: PT while here recommended continued PT services while at facility.  Discharge Diagnoses:  Principal Problem:   Respiratory failure with hypoxia Active Problems:   Atrial fibrillation   Chronic ischemic heart disease   Unspecified essential hypertension   Congestive heart failure   Leg wound, left   Discharge Condition: Stable  Diet recommendation: Heart healthy  Filed Weights   05/24/13 0506 05/25/13 0454 05/26/13 0453  Weight: 63.2 kg (139 lb 5.3 oz) 62.5 kg (137 lb 12.6 oz) 62.4 kg (137 lb 9.1 oz)    History of present illness:  Patient is a 80 year old with history of diastolic heart failure who presented to the ED with shortness of breath and found to be hypoxic on room air.  Hospital Course:  Respiratory failure with hypoxia  - Secondary to congestive heart failure most likely diastolic.  - Phosphorus and magnesium levels within normal limits. TSH WNL as well  - Increase po Lasix dose to 40 mg daily  Active Problems:  Chronic ischemic heart disease  - Stable continue omega-3 oil   Unspecified essential hypertension  - We'll plan on continuing amlodipine and enalapril  - Per discussion with nurse patient has allergy to metoprolol    Congestive heart failure  - EF unchanged when compared to last  - troponins negative x 3  - Increasing IV lasix, daily weights, monitor strict I/O's. Not much net loss when compared to prior value.  Leg wound, left  - Wound nurse consulted.      Procedures:  Echocardiogram: Showing normal EF of 60-65% with grade 2 diastolic dysfunction and normal wall motion  Consultations:  None  Discharge Exam: Filed Vitals:   05/26/13 0453  BP: 101/56  Pulse: 69  Temp: 98.4 F (36.9 C)  Resp: 16    General: Patient in no acute distress, alert and awake Cardiovascular: Regular rate and rhythm, no murmurs or rubs Respiratory: Clear to auscultation, no increased work of breathing, breathing comfortably on room air  Discharge Instructions  Discharge Orders   Future Appointments Provider Department Dept Phone   06/30/2013 1:00 PM Toribio Harbour, NP [facility] [phone]   07/12/2013 1:30 PM [facility] [place] Office [phone]   07/12/2013 1:45 PM Cassell Clement, MD [facility] [place] Office [phone]   Future Orders Complete By Expires   (HEART FAILURE PATIENTS) Call MD:  Anytime you have any of the following symptoms: 1) 3 pound weight gain in 24 hours or 5 pounds in 1 week 2) shortness of breath, with or without a dry hacking cough 3) swelling in the hands, feet or stomach 4) if you have to sleep on extra pillows at night in order to breathe.  As directed    Call MD for:  difficulty breathing, headache or visual disturbances  As directed    Call MD for:  temperature >100.4  As directed    Diet - low sodium heart healthy  As directed    Increase activity slowly  As directed        Medication List         amLODipine 5 MG tablet  Commonly known as:  NORVASC  Take 5 mg by mouth daily.     aspirin 81 MG tablet  Take 81 mg by mouth 3 (three) times a week.     beta carotene w/minerals tablet  Take 1 tablet by mouth daily. For eyes      doxepin 25 MG capsule  Commonly known as:  SINEQUAN  Take 1 capsule (25 mg total) by mouth at bedtime.     enalapril 2.5 MG tablet  Commonly known as:  VASOTEC  Take 2.5 mg by mouth daily.     fish oil-omega-3 fatty acids 1000 MG capsule  Take 1 g by mouth daily.     furosemide 40 MG tablet  Commonly known as:  LASIX  Take 1 tablet (40 mg total) by mouth daily.     loratadine 10 MG tablet  Commonly known as:  CLARITIN  Take 10 mg by mouth daily.     multivitamin tablet  Take 1 tablet by mouth daily.     polyethylene glycol packet  Commonly known as:  MIRALAX / GLYCOLAX  Take 17 g by mouth daily. Mix in 6 oz beverage in morin ing to relieve constipation     SENOKOT S 8.6-50 MG per tablet  Generic drug:  senna-docusate  Take 2 tablets by mouth. Nightly for stool softener and laxative, as needed     sodium chloride 2 % ophthalmic solution  Commonly known as:  MURO 128  Place 1 drop into the left eye 2 (two) times daily.     tamsulosin 0.4 MG Caps capsule  Commonly known as:  FLOMAX  Take 0.4 mg by mouth daily after breakfast.       Allergies  Allergen Reactions  . Metoprolol     "dementia" "lost touch with reality per Daughter"      The results of significant diagnostics from this hospitalization (including imaging, microbiology, ancillary and laboratory) are listed below for reference.    Significant Diagnostic Studies: Dg Chest 2 View  05/23/2013   CLINICAL DATA:  Wheezing and cough.  EXAM: CHEST  2 VIEW  COMPARISON:  07/17/2011  FINDINGS: Both costophrenic angles are blunted. Bilateral diffuse interstitial accentuation. Atherosclerotic aortic arch. Heart size within normal limits. Branching opacities are present in the right middle lobe and lingula with peripheral nodularity along the right middle lobe.  Chronic appearing anterior wedging at T11.  IMPRESSION: 1. Interstitial accentuation favoring interstitial edema or atypical pneumonia. 2. Small chronic  bilateral pleural effusions. 3. Bandlike opacities in the right middle lobe and lingula, favoring scarring or atelectasis over confluent edema or pneumonia.   Electronically Signed   By: Herbie Baltimore M.D.   On: 05/23/2013 13:10   Ct Angio Chest Pe W/cm &/or Wo Cm  05/23/2013   CLINICAL DATA:  Hypoxia.  EXAM: CT ANGIOGRAPHY CHEST WITH CONTRAST  TECHNIQUE: Multidetector CT imaging of the chest was performed using the standard protocol during bolus administration of intravenous contrast. Multiplanar CT image reconstructions including MIPs were obtained to evaluate the vascular anatomy.  CONTRAST:  OMNIPAQUE IOHEXOL 350 MG/ML SOLN  COMPARISON:  No priors.  FINDINGS: Mediastinum: No filling defects within the pulmonary  arterial tree to suggest underlying pulmonary embolism. Heart size is mildly enlarged. There is no significant pericardial fluid, thickening or pericardial calcification. There is atherosclerosis of the thoracic aorta, the great vessels of the mediastinum and the coronary arteries, including calcified atherosclerotic plaque in the left main, left anterior descending, left circumflex and right coronary arteries. No pathologically enlarged mediastinal or hilar lymph nodes. Esophagus is unremarkable in appearance.  Lungs/Pleura: Moderate to large bilateral pleural effusions (right greater than left) with some associated passive atelectasis in the lower lobes of the lungs bilaterally. 1.7 x 1.0 cm pleural based nodule in the periphery of the right middle lobe (image 64 of series 7). There is a tiny focus of internal calcification within this lesion best demonstrated on image 66 of series 4. There may also be a in a pleural-based nodule in the left lower lobe measuring approximately 2.0 x 1.4 cm (image 88 of series 7), with some adjacent smaller micronodules as well. Background of mild centrilobular and paraseptal emphysema. Scattered areas of mild subpleural reticulation.  Upper Abdomen: Multiple  calcifications in the spleen likely represent calcified granulomas.  Musculoskeletal: Compression fracture of T10 with approximately 50% loss of anterior vertebral body height appears to be old. There are no aggressive appearing lytic or blastic lesions noted in the visualized portions of the skeleton.  Review of the MIP images confirms the above findings.  IMPRESSION: 1. No evidence of pulmonary embolism. 2. Nodules in the lungs bilaterally, largest of which measure 2.0 x 1.4 cm in the inferior aspect of the left lower lobe and 1.7 x 1.0 cm in the periphery of the right middle lobe. The right middle lobe nodule appears have a focus of internal calcification. Given the granulomatous changes in the spleen, there is a possibility that these nodules could be related to benign old granulomatous disease, however, this is a diagnosis of exclusion. In correlation with PET-CT may be warranted if clinically appropriate. Alternatively, follow-up chest CT in 3 months would be useful to assess for the stability or growth of these lesions. Malignancy is not excluded. 3. Moderate to large bilateral pleural effusions. 4. Chronic changes of emphysema in the lungs bilaterally, possibly with some early changes related interstitial lung disease, as discussed above. 5. Atherosclerosis, including left main and 3 vessel coronary artery disease.   Electronically Signed   By: Trudie Reedaniel  Entrikin M.D.   On: 05/23/2013 14:37    Microbiology: Recent Results (from the past 240 hour(s))  URINE CULTURE     Status: None   Collection Time    05/23/13  3:56 PM      Result Value Range Status   Specimen Description URINE, CATHETERIZED   Final   Special Requests NONE   Final   Culture  Setup Time     Final   Value: 05/23/2013 21:51     Performed at [facility]   Colony Count     Final   Value: NO GROWTH     Performed at [facility]   Culture     Final   Value: NO GROWTH     Performed at [facility]   Report  Status 05/24/2013 FINAL   Final     Labs: Basic Metabolic Panel:  Recent Labs Lab 05/23/13 1245 05/23/13 1825 05/24/13 0542 05/26/13 0459  NA 129*  --  130* 134*  K 4.8  --  4.1 4.0  CL 92*  --  93* 95*  CO2 22  --  24 27  GLUCOSE 92  --  89 94  BUN 25*  --  27* 35*  CREATININE 1.01  --  1.12 1.28  CALCIUM 9.1  --  8.5 8.8  MG  --  2.0  --   --   PHOS  --  2.8  --   --    Liver Function Tests: No results found for this basename: AST, ALT, ALKPHOS, BILITOT, PROT, ALBUMIN,  in the last 168 hours No results found for this basename: LIPASE, AMYLASE,  in the last 168 hours No results found for this basename: AMMONIA,  in the last 168 hours CBC:  Recent Labs Lab 05/23/13 1245 05/24/13 0542  WBC 5.7 5.1  NEUTROABS 4.5  --   HGB 13.8 12.3*  HCT 39.1 35.0*  MCV 96.5 95.9  PLT 133* 118*   Cardiac Enzymes:  Recent Labs Lab 05/23/13 1245 05/23/13 1825 05/23/13 2347 05/24/13 0542  TROPONINI <0.30 <0.30 <0.30 <0.30   BNP: BNP (last 3 results)  Recent Labs  05/23/13 1245  PROBNP 1995.0*   CBG: No results found for this basename: GLUCAP,  in the last 168 hours     Signed:  Penny Pia  [facility] 05/26/2013, 1:20 PM

## 2013-05-26 NOTE — Progress Notes (Signed)
Patient cleared for discharge. Packet copied and placed in [place]. Family to transport patient. CSW spoke with crystal at facility. She asked that patient come around 330pm.  [name] MSW, LCSW [phone]

## 2013-05-26 NOTE — Evaluation (Addendum)
Physical Therapy Evaluation Patient Details Name: [name] MRN: [id number] DOB: 1912/05/15 Today's Date: 05/26/2013 Time: 6579-0383 PT Time Calculation (min): 31 min  PT Assessment / Plan / Recommendation History of Present Illness    [name] is a 80 y.o. male who presents for evaluation of dyspnea. The dyspnea is mostly at nighttime and improves when standing or walking. He was evaluated in urgent care today and found to have oxygen saturations in the low 80s, so he was sent here.    Clinical Impression  Pt very motivated and cooperative and currently mobilizing with min guard assist and use of RW for ambulation. Pt ambulated on RA maintaining O2 at 90% or higher.  Pt's functional mobility is limited by mild balance deficits and decreased endurance.  Pt plans d/c to [facility] rehab unit before eventual return to [facility] on [facility] campus.      PT Assessment  Patient needs continued PT services    Follow Up Recommendations  SNF (PT at [facility] rehab)    Does the patient have the potential to tolerate intense rehabilitation      Barriers to Discharge        Equipment Recommendations  None recommended by PT    Recommendations for Other Services     Frequency Min 3X/week    Precautions / Restrictions Precautions Precautions: Fall Restrictions Weight Bearing Restrictions: No   Pertinent Vitals/Pain No c/o pain at this time.      Mobility  Ambulation/Gait Ambulation Distance (Feet): 300 Feet General Gait Details: cues for pacing , posture and position from RW    Exercises     PT Diagnosis: Difficulty walking  PT Problem List: Decreased activity tolerance;Decreased mobility;Decreased balance;Decreased knowledge of use of DME PT Treatment Interventions: DME instruction;Gait training;Therapeutic activities;Therapeutic exercise;Balance training;Patient/family education     PT Goals(Current goals can be found in the care plan section) Acute Rehab PT  Goals Patient Stated Goal: Brief time in rehab and back to resume previous lifestyle in [place] at [place] PT Goal Formulation: With patient Time For Goal Achievement: 06/02/13 Potential to Achieve Goals: Good  Visit Information  Last PT Received On: 05/26/13 Assistance Needed: +1       Prior Functioning  Home Living Family/patient expects to be discharged to:: Skilled nursing facility Living Arrangements: Alone Additional Comments: Pt plans to d/c to rehab unit at [facility] Prior Function Level of Independence: Independent with assistive device(s) Comments: used RW Communication Communication: HOH Dominant Hand: Right    Cognition  Cognition Arousal/Alertness: Awake/alert Behavior During Therapy: WFL for tasks assessed/performed Overall Cognitive Status: Within Functional Limits for tasks assessed    Extremity/Trunk Assessment Upper Extremity Assessment Upper Extremity Assessment: Overall WFL for tasks assessed Lower Extremity Assessment Lower Extremity Assessment: Overall WFL for tasks assessed   Balance    End of Session PT - End of Session Equipment Utilized During Treatment: Gait belt Activity Tolerance: Patient tolerated treatment well;Patient limited by fatigue Patient left: in chair;with call bell/phone within reach Nurse Communication: Mobility status;Other (comment) (O2 sats with ambulation)  GP     [name] 05/26/2013, 12:26 PM

## 2013-05-26 NOTE — Progress Notes (Signed)
Clinical Social Work Department CLINICAL SOCIAL WORK PLACEMENT NOTE 05/26/2013  Patient:  [name], [name]  Account Number:  [id number] Admit date:  05/23/2013  Clinical Social Worker:  Becky Sax, LCSW  Date/time:  05/26/2013 12:00 M  Clinical Social Work is seeking post-discharge placement for this patient at the following level of care:   SKILLED NURSING   (*CSW will update this form in Epic as items are completed)   05/26/2013  Patient/family provided with [facility] Department of Clinical Social Work's list of facilities offering this level of care within the geographic area requested by the patient (or if unable, by the patient's family).  05/26/2013  Patient/family informed of their freedom to choose among providers that offer the needed level of care, that participate in Medicare, Medicaid or managed care program needed by the patient, have an available bed and are willing to accept the patient.  05/26/2013  Patient/family informed of [facility]' ownership interest in [facility], as well as of the fact that they are under no obligation to receive care at this facility.  PASARR submitted to EDS on 05/26/2013 PASARR number received from EDS on 05/26/2013  FL2 transmitted to all facilities in geographic area requested by pt/family on  05/26/2013 FL2 transmitted to all facilities within larger geographic area on 05/26/2013  Patient informed that his/her managed care company has contracts with or will negotiate with  certain facilities, including the following:     Patient/family informed of bed offers received:  05/26/2013 Patient chooses bed at [facility] Physician recommends and patient chooses bed at    Patient to be transferred to [facility] on  05/26/2013 Patient to be transferred to facility by   The following physician request were entered in Epic:

## 2013-05-26 NOTE — Progress Notes (Signed)
Called report to [facility] at [facility]. Patient is being transported by his son and discharge paperwork was given to son. J.Granville Whitefield, RN

## 2013-05-26 NOTE — Progress Notes (Signed)
Clinical Social Work Department BRIEF PSYCHOSOCIAL ASSESSMENT 05/26/2013  Patient:  [name], [name]     Account Number:  [id number]     Admit date:  05/23/2013  Clinical Social Worker:  Venia Minks  Date/Time:  05/26/2013 12:00 M  Referred by:  Physician  Date Referred:  05/26/2013 Referred for  SNF Placement   Other Referral:   Interview type:  Patient Other interview type:   family at bedside    PSYCHOSOCIAL DATA Living Status:  FACILITY Admitted from facility:  [facility] Level of care:  Independent Living Primary support name:  Josephine Igo Primary support relationship to patient:  CHILD, ADULT Degree of support available:   excellent    CURRENT CONCERNS Current Concerns  Post-Acute Placement   Other Concerns:    SOCIAL WORK ASSESSMENT / PLAN CSW met with patient and family at bedside. patient is from [facility] independent living. Patient is ready to go today and the recommendation is for patient to go to snf. family states that they have already called [facility] and that they have a bed available.   Assessment/plan status:   Other assessment/ plan:   Information/referral to community resources:    PATIENT'S/FAMILY'S RESPONSE TO PLAN OF CARE: patient is hopeful that short term snf will make him able to return to his independent apartment.

## 2013-05-26 NOTE — Progress Notes (Signed)
SATURATION QUALIFICATIONS: (This note is used to comply with regulatory documentation for home oxygen)  Patient Saturations on Room Air at Rest = 93%  Patient Saturations on Room Air while Ambulating = 90%  Patient has been weaned off oxygen as he is maintaining oxygen saturations above 92% at rest.

## 2013-05-27 ENCOUNTER — Non-Acute Institutional Stay (SKILLED_NURSING_FACILITY): Payer: Medicare Other | Admitting: Geriatric Medicine

## 2013-05-27 ENCOUNTER — Encounter: Payer: Self-pay | Admitting: Geriatric Medicine

## 2013-05-27 DIAGNOSIS — Z5189 Encounter for other specified aftercare: Secondary | ICD-10-CM

## 2013-05-27 DIAGNOSIS — S81802D Unspecified open wound, left lower leg, subsequent encounter: Secondary | ICD-10-CM

## 2013-05-27 DIAGNOSIS — I5033 Acute on chronic diastolic (congestive) heart failure: Secondary | ICD-10-CM

## 2013-05-27 NOTE — Assessment & Plan Note (Addendum)
Acute dyspnea and hypoxemia related to diastolic heart failure. Patient responded well to increased diuretic. No sign of volume overload today. Continue to monitor weight/activity status daily. Recommend patient stays in rehabilitation section several days to assure overall stability. He'll be evaluated by PT and OT, and encourage gentle return to usual activity level

## 2013-05-27 NOTE — Progress Notes (Signed)
Patient ID: DEMONE LYLES, male   DOB: 1912-05-17, 79 y.o.   MRN: [id number]   [facility] [phone])  Code Status: Living will, DO NOT RESUSCITATE  Contact Information   Name Relation Home Work Mobile   Garden Daughter [phone]  [phone]   [name] [phone]  [phone]   Josejulian, Tarango Significant other [phone]         Chief Complaint  Patient presents with  . Hospitalization Follow-up    CHF    HPI: This is a 80 y.o. male resident of [facility],  Independent Living  section. He was admitted to hospital in 05/23/2012 with shortness of breath. He presented to urgent care center earlier that day with same symptoms noted to have oxygen saturation in the low 80s was sent to the emergency department for further evaluation. ED workup found the patient to have exacerbation of congestive heart failure, without evidence for acute coronary syndrome, PE, or pneumonia. Due to hypoxemia he was admitted for stabilization. Patient was treated with IV diuretic with improvement, but he continued to require supplemental oxygen. 2-D echocardiogram was completed, no significant change from prior study of 2012. Patient was ready for discharge to a skilled rehabilitation section of [facility] on 10/24/2013     Allergies  Allergen Reactions  . Metoprolol     "dementia" "lost touch with reality per Daughter"     Medication List       This list is accurate as of: 05/27/13  9:09 PM.  Always use your most recent med list.               amLODipine 5 MG tablet  Commonly known as:  NORVASC  Take 5 mg by mouth daily.     aspirin 81 MG tablet  Take 81 mg by mouth 3 (three) times a week.     beta carotene w/minerals tablet  Take 1 tablet by mouth daily. For eyes     doxepin 25 MG capsule  Commonly known as:  SINEQUAN  Take 1 capsule (25 mg total) by mouth at bedtime.     enalapril 2.5 MG tablet  Commonly known as:   VASOTEC  Take 2.5 mg by mouth daily.     fish oil-omega-3 fatty acids 1000 MG capsule  Take 1 g by mouth daily.     furosemide 40 MG tablet  Commonly known as:  LASIX  Take 1 tablet (40 mg total) by mouth daily.     loratadine 10 MG tablet  Commonly known as:  CLARITIN  Take 10 mg by mouth daily.     multivitamin tablet  Take 1 tablet by mouth daily.     polyethylene glycol packet  Commonly known as:  MIRALAX / GLYCOLAX  Take 17 g by mouth daily. Mix in 6 oz beverage in morin ing to relieve constipation     SENOKOT S 8.6-50 MG per tablet  Generic drug:  senna-docusate  Take 2 tablets by mouth. Nightly for stool softener and laxative, as needed     sodium chloride 2 % ophthalmic solution  Commonly known as:  MURO 128  Place 1 drop into the left eye 2 (two) times daily.     tamsulosin 0.4 MG Caps capsule  Commonly known as:  FLOMAX  Take 0.4 mg by mouth daily after breakfast.         DATA REVIEWED  Radiologic Exams 05/23/2012  CHEST  2 VIEW   COMPARISON:  07/17/2011 IMPRESSION: 1. Interstitial accentuation favoring  interstitial edema or atypical pneumonia. 2. Small chronic bilateral pleural effusions. 3. Bandlike opacities in the right middle lobe and lingula, favoring  scarring or atelectasis over confluent edema or pneumonia.   CT ANGIOGRAPHY CHEST WITH CONTRAST  COMPARISON:  No priors. IMPRESSION: 1. No evidence of pulmonary embolism. 2. Nodules in the lungs bilaterally, largest of which measure 2.0 x1.4 cm in the inferior aspect of the left lower lobe and 1.7 x 1.0  cm in the periphery of the right middle lobe. The right middle lobe nodule appears have a focus of internal calcification. Given the granulomatous changes in the spleen, there is a possibility that these nodules could be related to benign old granulomatous disease, however, this is a diagnosis of exclusion. In correlation with PET-CT may be warranted if clinically appropriate. Alternatively, follow-up  chest CT in 3 months would be useful to assess for the stability or growth of these lesions. Malignancy is not excluded. 3. Moderate to large bilateral pleural effusions. 4. Chronic changes of emphysema in the lungs bilaterally, possibly with some early changes related interstitial lung disease, as discussed above. 5. Atherosclerosis, including left main and 3 vessel coronary artery disease.    Cardiovascular Exams 05/24/2013  Transthoracic Echocardiography Study Conclusions - Left ventricle: The cavity size was normal. There was   moderate concentric hypertrophy. Systolic function was  normal. The estimated ejection fraction was in the range of 60% to 65%. Wall motion was normal; there were no regional wall motion abnormalities. Doppler parameters are consistent withpseudonormal left ventricular relaxation (grade2 diastolic dysfunction). The E/e' ratio is >15,  suggesting markedly elevated LV filling pressure. - Aortic valve: Moderately calcified with reduced leaflet excursion. There was no stenosis. Trace to mild regurgitation. - Aorta: The aorta was mildly calcified. - Mitral valve: Calcified annulus. Mild regurgitation. - Right atrium: The atrium was mildly dilated. - Inferior vena cava: The vessel was normal in size; the respirophasic diameter changes were in the normal range (=  50%); findings are consistent with normal central venous pressure.   Laboratory Studies Lab Results- hospital 05/2013  Component Value Date   WBC 5.1 05/24/2013   HGB 12.3* 05/24/2013   HCT 35.0* 05/24/2013   PLT 118* 05/24/2013        GLUCOSE 94 05/26/2013   NA 134* 05/26/2013   K 4.0 05/26/2013   CL 95* 05/26/2013   CREATININE 1.28 05/26/2013   BUN 35* 05/26/2013   CO2 27 05/26/2013        TSH 1.727 05/23/2013        ProBNP 1995.0         Past Medical History  Diagnosis Date  . Atrial fibrillation     Not on Coumadin due to recurrent, severe GIB  . H/O: GI bleed   . Inguinal hernia   . Spinal stenosis     S/p  surgery   . MI, old 89  . Diverticulitis   . BPH (benign prostatic hyperplasia)   . Anemia   . Arthritis   . Blood transfusion   . CHF (congestive heart failure)   . Hypertension   . Heart attack 1976  . Unspecified hereditary and idiopathic peripheral neuropathy 03/25/2012  . Corns and callosities 12/18/2011  . Pain in joint, ankle and foot 11/20/2011  . Chronic airway obstruction, not elsewhere classified 07/17/2011  . Other dyspnea and respiratory abnormality 07/17/2011  . Other specified cardiac dysrhythmias(427.89) 06/04/2011  . Typhoid fever 05/29/2011  . Macular degeneration (senile) of retina, unspecified 05/29/2011  .  Senile cataract, unspecified 05/29/2011  . Pericarditis, acute 05/29/2011  . Acute systolic heart failure 05/29/2011  . Unspecified venous (peripheral) insufficiency 05/29/2011  . Duodenal ulcer 05/29/2011  . Osteoarthrosis, unspecified whether generalized or localized, pelvic region and thigh 05/29/2011  . Other and unspecified disc disorder of lumbar region 05/29/2011  . Spasm of muscle 05/29/2011  . Abnormality of gait 05/29/2011  . Closed fracture of pubis 05/29/2011  . Transient ischemic attack (TIA), and cerebral infarction without residual deficits(V12.54) 2013  . Coronary atherosclerosis of native coronary artery 2013  . Pneumonia, organism unspecified 2013  . Unspecified constipation 2013  . Debility, unspecified 05/24/2011  . Unspecified essential hypertension 09/23/2012   Past Surgical History  Procedure Laterality Date  . Spine surgery      Spinal stenosis surgery with Dr. Darrelyn HillockGioffre   . Cholecystectomy    . Total hip arthroplasty  2002    Right   . Vein surgery  2007    leg  . Inguinal hernia repair  2003    RIH  . Appendectomy    . Tonsillectomy and adenoidectomy     Family Status  Relation Status Death Age  . Father Deceased 2368  . Mother Deceased 6976  . Sister Deceased 9462  . Daughter Alive   . Son Alive   . Sister Deceased 1280"s  . Son Alive    History    Social History Narrative   Patient is Widowed. Significant other, Betty. Occupation: Pensions consultantAttorney.   Lives in single level home, Independent Living section at [facility] since 1995   Stopped smoking 1976. Minimal alcohol history   Patient has Advanced planning documents: Living Will, DNR        22  REVIEW OF SYSTEMS  DATA OBTAINED: from patient, nurse, medical record GENERAL: Feels well   No fevers, fatigue, change in appetite or weight SKIN: No itch, rash or open wounds EYES: No eye pain, dryness or itching  No change in vision EARS: No earache, tinnitus, change in hearing NOSE: No congestion, drainage or bleeding MOUTH/THROAT: No mouth or tooth pain  No sore throat No difficulty chewing or swallowing RESPIRATORY: No cough, wheezing, SOB CARDIAC: No chest pain, palpitations  No edema. GI: No abdominal pain  No N/V/D or constipation  No heartburn or reflux  GU: No dysuria, frequency or urgency  No change in urine volume or character MUSCULOSKELETAL: No joint pain, swelling or stiffness  No back pain  No muscle ache, pain, weakness  Gait is steady  No recent falls.  NEUROLOGIC: No dizziness, fainting, headache,  No change in mental status.  PSYCHIATRIC: No feelings of anxiety, depression Sleeps well.      PHYSICAL EXAM Filed Vitals:   05/27/13 2105  BP: 111/72  Pulse: 83  Temp: 96.6 F (35.9 C)  Resp: 20  Weight: 139 lb 9.6 oz (63.322 kg)  SpO2: 95%   Body mass index is 21.86 kg/(m^2).  GENERAL APPEARANCE: No acute distress, appropriately groomed, Frail body habitus. Alert, pleasant, conversant. SKIN: No diaphoresis, rash  Wound left lower leg is contracting, remains very superficial. Size is 6 cm x 1.5 cm. Lipase was 100% granulation. No edema of the lower leg HEAD: Normocephalic, atraumatic EYES: Conjunctiva/lids clear. Pupils round, reactive.   EARS: External exam WNL, poor hearing (chronic). NOSE: No deformity or discharge. MOUTH/THROAT: Lips w/o  lesions. Oral mucosa, tongue moist, w/o lesion. Oropharynx w/o redness or lesions.  NECK: Supple, full ROM. No thyroid tenderness, enlargement or nodule LYMPHATICS: No head,  neck or supraclavicular adenopathy RESPIRATORY: Breathing is even, unlabored. Lung sounds are clear and full. O2 Sat 95%RA CARDIOVASCULAR: Heart IRRR. No murmur or extra heart sounds  ARTERIAL: No carotid, aortic or femoral bruit. Carotid, Femoral, Popliteal, DP,PT pulse 2+.  EDEMA: No peripheral edema.  GASTROINTESTINAL: Abdomen is soft, non-tender, not distended w/ normal bowel sounds. No hepatic or splenic enlargement.  MUSCULOSKELETAL: Moves all extremities with full ROM, strength and tone. Back is without kyphosis, scoliosis or spinal process tenderness. Gait is steady per PT report NEUROLOGIC: Oriented to time, place, person. Cranial nerves 2-12 grossly intact, speech clear, no tremor.  PSYCHIATRIC: Mood and affect appropriate to situation  ASSESSMENT/PLAN  Acute on chronic diastolic heart failure Acute dyspnea and hypoxemia related to diastolic heart failure. Patient responded well to increased diuretic. No sign of volume overload today. Continue to monitor weight/activity status daily. Recommend patient stays in rehabilitation section several days to assure overall stability. He'll be evaluated by PT and OT, and encourage gentle return to usual activity level  Open wound, lower leg Slow healing traumatic wound left lower leg. Wound is clean today, there is no edema in the lower extremity. Wound appears to be contracting. Continue current daily dressing change, he is coughing to keep dressing in place and control LE extremity edema   Follow up: AS needed  Marke Goodwyn T.Nicholaus Steinke, NP-C 05/27/2013

## 2013-05-27 NOTE — Assessment & Plan Note (Signed)
Slow healing traumatic wound left lower leg. Wound is clean today, there is no edema in the lower extremity. Wound appears to be contracting. Continue current daily dressing change, he is coughing to keep dressing in place and control LE extremity edema

## 2013-05-31 ENCOUNTER — Encounter: Payer: Self-pay | Admitting: Cardiology

## 2013-06-01 ENCOUNTER — Telehealth: Payer: Self-pay | Admitting: Cardiology

## 2013-06-01 NOTE — Telephone Encounter (Signed)
New problem   [name] want her dad to be seen sooner than 06/29/13 because her dad had been to the hospital for some issues. Please call pt's daughter concerning this matter.

## 2013-06-01 NOTE — Telephone Encounter (Signed)
Scheduled appointment with Dawayne Patricia NP this week

## 2013-06-04 ENCOUNTER — Ambulatory Visit (INDEPENDENT_AMBULATORY_CARE_PROVIDER_SITE_OTHER): Payer: Medicare Other | Admitting: Nurse Practitioner

## 2013-06-04 ENCOUNTER — Encounter: Payer: Self-pay | Admitting: Nurse Practitioner

## 2013-06-04 VITALS — BP 110/50 | HR 65 | Ht 66.75 in | Wt 140.8 lb

## 2013-06-04 DIAGNOSIS — I5032 Chronic diastolic (congestive) heart failure: Secondary | ICD-10-CM

## 2013-06-04 LAB — BASIC METABOLIC PANEL
BUN: 41 mg/dL — ABNORMAL HIGH (ref 6–23)
CO2: 27 mEq/L (ref 19–32)
Calcium: 9.2 mg/dL (ref 8.4–10.5)
Chloride: 102 mEq/L (ref 96–112)
Creatinine, Ser: 1.4 mg/dL (ref 0.4–1.5)
GFR: 50.39 mL/min — ABNORMAL LOW (ref >60.00)
Glucose, Bld: 81 mg/dL (ref 70–99)
Potassium: 4.9 mEq/L (ref 3.5–5.1)
Sodium: 137 mEq/L (ref 135–145)

## 2013-06-04 NOTE — Patient Instructions (Addendum)
We need to check labs today  Keep your regular visit with Dr. Patty Sermons for next month  Continue with your current medicines.  Weigh yourself each morning and record.  Call us for a weight gain of 3 pounds in 24 hours.   Try to limit your salt  Call the [facility] office at [phone] if you have any questions, problems or concerns.

## 2013-06-04 NOTE — Progress Notes (Signed)
[name] Date of Birth: 07-Jun-1911 Medical Record #[id number]  History of Present Illness: [name] is seen back today for a post hospital visit. Seen for Dr. Patty Sermons. [name] is 80 years of age. [name] has PAF/flutter - no on coumadin due to prior GI bleeding, past pneumonia, HTN and diastolic HF. Other issues as noted below.   Most recently admitted with progressive shortness of breath - was hypoxic - admitted to the hospitalist service - diuresed. Lasix was increased at discharge.   Comes back today. Here with [name] daughter. Using a walker. Doing ok. No real problems. No chest pain. Breathing has improved. Swelling has improved. Still with the leg wound (from hitting a car door back in the fall). [name] thinks [name] is doing ok.   Current Outpatient Prescriptions  Medication Sig Dispense Refill  . amLODipine (NORVASC) 5 MG tablet Take 5 mg by mouth daily.      Marland Kitchen aspirin 81 MG tablet Take 81 mg by mouth 3 (three) times a week.        . beta carotene w/minerals (OCUVITE) tablet Take 1 tablet by mouth daily. For eyes      . doxepin (SINEQUAN) 25 MG capsule Take 1 capsule (25 mg total) by mouth at bedtime.  90 capsule  3  . enalapril (VASOTEC) 2.5 MG tablet Take 2.5 mg by mouth daily.      . fish oil-omega-3 fatty acids 1000 MG capsule Take 1 g by mouth daily.       . furosemide (LASIX) 40 MG tablet Take 1 tablet (40 mg total) by mouth daily.  90 tablet  1  . Multiple Vitamin (MULTIVITAMIN) tablet Take 1 tablet by mouth daily.        . polyethylene glycol (MIRALAX / GLYCOLAX) packet Take 17 g by mouth daily. Mix in 6 oz beverage in morin ing to relieve constipation      . senna-docusate (SENOKOT S) 8.6-50 MG per tablet Take 2 tablets by mouth. Nightly for stool softener and laxative, as needed      . sodium chloride (MURO 128) 2 % ophthalmic solution Place 1 drop into the left eye 2 (two) times daily.      . tamsulosin (FLOMAX) 0.4 MG CAPS capsule Take 0.4 mg by mouth daily after breakfast.       . [DISCONTINUED] levalbuterol (XOPENEX HFA) 45 MCG/ACT inhaler Inhale 1-2 puffs into the lungs every 6 (six) hours as needed for wheezing.  1 Inhaler    . [DISCONTINUED] metoprolol tartrate (LOPRESSOR) 25 MG tablet Take 1 tablet (25 mg total) by mouth 2 (two) times daily.      . [DISCONTINUED] nitroGLYCERIN (NITROSTAT) 0.4 MG SL tablet Place 0.4 mg under the tongue every 5 (five) minutes as needed.         No current facility-administered medications for this visit.    Allergies  Allergen Reactions  . Metoprolol     "dementia" "lost touch with reality per Daughter"    Past Medical History  Diagnosis Date  . Atrial fibrillation     Not on Coumadin due to recurrent, severe GIB  . H/O: GI bleed   . Inguinal hernia   . Spinal stenosis     S/p surgery   . MI, old 9  . Diverticulitis   . BPH (benign prostatic hyperplasia)   . Anemia   . Arthritis   . Blood transfusion   . CHF (congestive heart failure)   . Hypertension   . Heart attack  1976  . Unspecified hereditary and idiopathic peripheral neuropathy 03/25/2012  . Corns and callosities 12/18/2011  . Pain in joint, ankle and foot 11/20/2011  . Chronic airway obstruction, not elsewhere classified 07/17/2011  . Other dyspnea and respiratory abnormality 07/17/2011  . Other specified cardiac dysrhythmias(427.89) 06/04/2011  . Typhoid fever 05/29/2011  . Macular degeneration (senile) of retina, unspecified 05/29/2011  . Senile cataract, unspecified 05/29/2011  . Pericarditis, acute 05/29/2011  . Acute systolic heart failure 05/29/2011  . Unspecified venous (peripheral) insufficiency 05/29/2011  . Duodenal ulcer 05/29/2011  . Osteoarthrosis, unspecified whether generalized or localized, pelvic region and thigh 05/29/2011  . Other and unspecified disc disorder of lumbar region 05/29/2011  . Spasm of muscle 05/29/2011  . Abnormality of gait 05/29/2011  . Closed fracture of pubis 05/29/2011  . Transient ischemic attack (TIA), and cerebral infarction without  residual deficits(V12.54) 2013  . Coronary atherosclerosis of native coronary artery 2013  . Pneumonia, organism unspecified 2013  . Unspecified constipation 2013  . Debility, unspecified 05/24/2011  . Unspecified essential hypertension 09/23/2012    Past Surgical History  Procedure Laterality Date  . Spine surgery      Spinal stenosis surgery with Dr. Darrelyn HillockGioffre   . Cholecystectomy    . Total hip arthroplasty  2002    Right   . Vein surgery  2007    leg  . Inguinal hernia repair  2003    RIH  . Appendectomy    . Tonsillectomy and adenoidectomy      History  Smoking status  . Former Smoker  . Quit date: 05/20/1974  Smokeless tobacco  . Never Used    History  Alcohol Use  . Yes    Comment: 2 oz daily     Family History  Problem Relation Age of Onset  . Heart attack Father   . Heart disease Father   . Rheumatologic disease Mother   . Cancer Sister     colon  . Cancer Sister     lung    Review of Systems: The review of systems is per the HPI.  All other systems were reviewed and are negative.  Physical Exam: BP 110/50  Pulse 65  Ht 5' 6.75" (1.695 m)  Wt 140 lb 12.8 oz (63.866 kg)  BMI 22.23 kg/m2  SpO2 95% Patient is very pleasant and in no acute distress. Looks younger than [name] stated age. Using a walker. Skin is warm and dry. Color is normal.  HEENT is unremarkable. Normocephalic/atraumatic. PERRL. Sclera are nonicteric. Neck is supple. No masses. No JVD. Lungs are fairly clear. Cardiac exam shows a regular rate and rhythm. Abdomen is soft. Extremities are without edema. Left leg is wrapped. Gait and ROM are intact. No gross neurologic deficits noted.  Wt Readings from Last 3 Encounters:  06/04/13 140 lb 12.8 oz (63.866 kg)  05/27/13 139 lb 9.6 oz (63.322 kg)  05/26/13 137 lb 9.1 oz (62.4 kg)    LABORATORY DATA: BMET is pending for today   Lab Results  Component Value Date   WBC 5.1 05/24/2013   HGB 12.3* 05/24/2013   HCT 35.0* 05/24/2013   PLT 118*  05/24/2013   GLUCOSE 94 05/26/2013   CHOL 106 05/15/2011   TRIG 55 05/15/2011   HDL 52 05/15/2011   LDLCALC 43 05/15/2011   ALT 77* 05/15/2011   AST 112* 05/15/2011   NA 134* 05/26/2013   K 4.0 05/26/2013   CL 95* 05/26/2013   CREATININE 1.28 05/26/2013   BUN  35* 05/26/2013   CO2 27 05/26/2013   TSH 1.727 05/23/2013   INR 1.0 06/10/2007   Echo Study Conclusions from January 2015  - Left ventricle: The cavity size was normal. There was moderate concentric hypertrophy. Systolic function was normal. The estimated ejection fraction was in the range of 60% to 65%. Wall motion was normal; there were no regional wall motion abnormalities. Doppler parameters are consistent withpseudonormal left ventricular relaxation (grade2 diastolic dysfunction). The E/e' ratio is >15, suggesting markedly elevated LV filling pressure. - Aortic valve: Moderately calcified with reduced leaflet excursion. There was no stenosis. Trace to mild regurgitation. - Aorta: The aorta was mildly calcified. - Mitral valve: Calcified annulus. Mild regurgitation. - Right atrium: The atrium was mildly dilated. - Inferior vena cava: The vessel was normal in size; the respirophasic diameter changes were in the normal range (= 50%); findings are consistent with normal central venous pressure.  Dg Chest 2 View  05/23/2013   IMPRESSION: 1. Interstitial accentuation favoring interstitial edema or atypical pneumonia. 2. Small chronic bilateral pleural effusions. 3. Bandlike opacities in the right middle lobe and lingula, favoring scarring or atelectasis over confluent edema or pneumonia.   Electronically Signed   By: Herbie Baltimore M.D.   On: 05/23/2013 13:10   Ct Angio Chest Pe W/cm &/or Wo Cm  05/23/2013   IMPRESSION: 1. No evidence of pulmonary embolism. 2. Nodules in the lungs bilaterally, largest of which measure 2.0 x 1.4 cm in the inferior aspect of the left lower lobe and 1.7 x 1.0 cm in the periphery of the right middle lobe. The  right middle lobe nodule appears have a focus of internal calcification. Given the granulomatous changes in the spleen, there is a possibility that these nodules could be related to benign old granulomatous disease, however, this is a diagnosis of exclusion. In correlation with PET-CT may be warranted if clinically appropriate. Alternatively, follow-up chest CT in 3 months would be useful to assess for the stability or growth of these lesions. Malignancy is not excluded. 3. Moderate to large bilateral pleural effusions. 4. Chronic changes of emphysema in the lungs bilaterally, possibly with some early changes related interstitial lung disease, as discussed above. 5. Atherosclerosis, including left main and 3 vessel coronary artery disease.   Electronically Signed   By: Trudie Reed M.D.   On: 05/23/2013 14:37    Assessment / Plan: 1. Diastolic heart failure - looks compensated - no change in therapy. Recheck BMET today.   2. PAF/flutter - managed with rate control - not a candidate for anticoagulation - [name] is in sinus by exam today.  3. Advanced age  44. Abnormal chest CT - suggested follow up in 3 months - will defer to Dr. Patty Sermons - this was NOT discussed at [name] OV today.   Patient is agreeable to this plan and will call if any problems develop in the interim.   Rosalio Macadamia, RN, ANP-C [facility] [address] [place], [place]  [address] [phone]

## 2013-06-08 LAB — BASIC METABOLIC PANEL
Glucose: 90 mg/dL
SODIUM: 138 mmol/L (ref 137–147)

## 2013-06-09 ENCOUNTER — Non-Acute Institutional Stay: Payer: Medicare Other | Admitting: Geriatric Medicine

## 2013-06-09 ENCOUNTER — Encounter: Payer: Self-pay | Admitting: Geriatric Medicine

## 2013-06-09 VITALS — BP 120/68 | HR 72 | Ht 66.75 in | Wt 141.0 lb

## 2013-06-09 DIAGNOSIS — S91009A Unspecified open wound, unspecified ankle, initial encounter: Secondary | ICD-10-CM

## 2013-06-09 DIAGNOSIS — S81009A Unspecified open wound, unspecified knee, initial encounter: Secondary | ICD-10-CM

## 2013-06-09 DIAGNOSIS — I5032 Chronic diastolic (congestive) heart failure: Secondary | ICD-10-CM

## 2013-06-09 DIAGNOSIS — S81802A Unspecified open wound, left lower leg, initial encounter: Secondary | ICD-10-CM

## 2013-06-09 DIAGNOSIS — S81809A Unspecified open wound, unspecified lower leg, initial encounter: Secondary | ICD-10-CM

## 2013-06-09 HISTORY — DX: Chronic diastolic (congestive) heart failure: I50.32

## 2013-06-09 NOTE — Assessment & Plan Note (Signed)
Acute exacerbation has resolved, patient is tolerating daily diuretic dose without difficulty, has resumed usual activity status. There has been a mild bump in his BUN and creatinine, electrolytes are stable without potassium supplement. Continue current medications and activity. He is looking forward to celebrating his 89th birthday in April of this year. He will followup with Dr. Patty Sermons as scheduled

## 2013-06-09 NOTE — Progress Notes (Signed)
Patient ID: [name], male   DOB: 08-18-1911, 80 y.o.   MRN: [id number]   [facility] [phone])  Code Status: Living will, DO NOT RESUSCITATE      Contact Information   Name Relation Home Work [place] Son [phone]  [phone]   [facility] Daughter [phone]  [phone]   [name] [phone]  [phone]   [facility] Significant other [phone]     [name] [phone]  [phone]       Chief Complaint  Patient presents with  . Hospitalization Follow-up    in [facility] 05/23/13 to 05/26/13 for Respiratory Failure with hypoxia. Discharged from Rehab 05/31/13 back to his apartment    HPI: This is a 80 y.o. male resident of [facility],  Independent Living  section. He was admitted to hospital in 05/23/2012 with shortness of breath, diagnosed with exacerbation of congestive heart failure, without evidence for acute coronary syndrome, PE, or pneumonia.  Patient was treated with IV diuretic with improvement, but he continued to require supplemental oxygen. 2-D echocardiogram was completed, no significant change from prior study of 2012. Patient was discharged to skilled rehabilitation section of [facility] on 05/26/2013  Last visit: Acute on chronic diastolic heart failure Acute dyspnea and hypoxemia related to diastolic heart failure. Patient responded well to increased diuretic. No sign of volume overload today. Continue to monitor weight/activity status daily. Recommend patient stays in rehabilitation section several days to assure overall stability. He'll be evaluated by PT and OT, and encourage gentle return to usual activity level  Open wound, lower leg Slow healing traumatic wound left lower leg. Wound is clean today, there is no edema in the lower extremity. Wound appears to be contracting. Continue current daily dressing change, he is coughing to keep dressing in place and control  LE extremity edema   Since last visit, patient returned to his independent living home, no acute issues. He returned for cardiology followup, no changes were recommended. Patient tells me he is feeling very well has resumed his usual activities including daily walking outside in his neighborhood. He walks for about 20 minutes, includes a gentle hill. He denies any shortness of breath with this activity. He continues to weigh himself at home daily weight is stable at around 141 pounds. BMP was repeated yesterday electrolytes are stable, there is a slight bump in his BUN and creatinine. Clinic and wound care nurse continue to follow this patient's slow healing left lower extremity wound.  Allergies  Allergen Reactions  . Metoprolol     "dementia" "lost touch with reality per Daughter"     Medication List       This list is accurate as of: 06/09/13  8:57 AM.  Always use your most recent med list.               amLODipine 5 MG tablet  Commonly known as:  NORVASC  Take 5 mg by mouth daily.     aspirin 81 MG tablet  Take 81 mg by mouth 3 (three) times a week.     beta carotene w/minerals tablet  Take 1 tablet by mouth daily. For eyes     doxepin 25 MG capsule  Commonly known as:  SINEQUAN  Take 1 capsule (25 mg total) by mouth at bedtime.     enalapril 2.5 MG tablet  Commonly known as:  VASOTEC  Take 2.5 mg by mouth daily.     fish oil-omega-3 fatty acids 1000 MG capsule  Take 1 g by mouth daily.     furosemide 40 MG tablet  Commonly known as:  LASIX  Take 1 tablet (40 mg total) by mouth daily.     multivitamin tablet  Take 1 tablet by mouth daily.     polyethylene glycol packet  Commonly known as:  MIRALAX / GLYCOLAX  Take 17 g by mouth daily. Mix in 6 oz beverage in morin ing to relieve constipation     SENOKOT S 8.6-50 MG per tablet  Generic drug:  senna-docusate  Take 2 tablets by mouth. Nightly for stool softener and laxative, as needed     sodium chloride 2 %  ophthalmic solution  Commonly known as:  MURO 128  Place 1 drop into the left eye 2 (two) times daily.     tamsulosin 0.4 MG Caps capsule  Commonly known as:  FLOMAX  Take 0.4 mg by mouth daily after breakfast.        DATA REVIEWED  Radiologic Exams 05/23/2012  CHEST  2 VIEW   COMPARISON:  07/17/2011 IMPRESSION: 1. Interstitial accentuation favoring interstitial edema or atypical pneumonia. 2. Small chronic bilateral pleural effusions. 3. Bandlike opacities in the right middle lobe and lingula, favoring  scarring or atelectasis over confluent edema or pneumonia.   CT ANGIOGRAPHY CHEST WITH CONTRAST  COMPARISON:  No priors. IMPRESSION: 1. No evidence of pulmonary embolism. 2. Nodules in the lungs bilaterally, largest of which measure 2.0 x1.4 cm in the inferior aspect of the left lower lobe and 1.7 x 1.0  cm in the periphery of the right middle lobe. The right middle lobe nodule appears have a focus of internal calcification. Given the granulomatous changes in the spleen, there is a possibility that these nodules could be related to benign old granulomatous disease, however, this is a diagnosis of exclusion. In correlation with PET-CT may be warranted if clinically appropriate. Alternatively, follow-up chest CT in 3 months would be useful to assess for the stability or growth of these lesions. Malignancy is not excluded. 3. Moderate to large bilateral pleural effusions. 4. Chronic changes of emphysema in the lungs bilaterally, possibly with some early changes related interstitial lung disease, as discussed above. 5. Atherosclerosis, including left main and 3 vessel coronary artery disease.    Cardiovascular Exams 05/24/2013  Transthoracic Echocardiography Study Conclusions - Left ventricle: The cavity size was normal. There was   moderate concentric hypertrophy. Systolic function was  normal. The estimated ejection fraction was in the range of 60% to 65%. Wall motion was normal;  there were no regional wall motion abnormalities. Doppler parameters are consistent withpseudonormal left ventricular relaxation (grade2 diastolic dysfunction). The E/e' ratio is >15,  suggesting markedly elevated LV filling pressure. - Aortic valve: Moderately calcified with reduced leaflet excursion. There was no stenosis. Trace to mild regurgitation. - Aorta: The aorta was mildly calcified. - Mitral valve: Calcified annulus. Mild regurgitation. - Right atrium: The atrium was mildly dilated. - Inferior vena cava: The vessel was normal in size; the respirophasic diameter changes were in the normal range (=  50%); findings are consistent with normal central venous pressure.   Laboratory Studies Lab Results- hospital 05/2013  Component Value Date   WBC 5.1 05/24/2013   HGB 12.3* 05/24/2013   HCT 35.0* 05/24/2013   PLT 118* 05/24/2013        GLUCOSE 94 05/26/2013   NA 134* 05/26/2013   K 4.0 05/26/2013   CL 95* 05/26/2013   CREATININE 1.28 05/26/2013   BUN 35*  05/26/2013   CO2 27 05/26/2013        TSH 1.727 05/23/2013        ProBNP 1995.0    Lab Results- [facility] 06/08/2013  Component Value Date   GLUCOSE 90 06/04/2013   NA 138 06/08/2013   K 4.9 06/04/2013   CL 102 06/04/2013   CREATININE 1.32 06/04/2013   BUN 42 06/04/2013     REVIEW OF SYSTEMS  DATA OBTAINED: from patient, nurse, medical record GENERAL: Feels well   No fevers, fatigue, change in appetite or weight SKIN: No itch, rash or open wounds EYES: No eye pain, dryness or itching  No change in vision EARS: No earache, tinnitus, change in hearing NOSE: No congestion, drainage or bleeding MOUTH/THROAT: No mouth or tooth pain  No sore throat No difficulty chewing or swallowing RESPIRATORY: No cough, wheezing, SOB CARDIAC: No chest pain, palpitations  No edema. GI: No abdominal pain  No N/V/D or constipation  No heartburn or reflux  GU: No dysuria, frequency or urgency  No change in urine volume or character  MUSCULOSKELETAL: No joint pain,  swelling or stiffness  No back pain  No muscle ache, pain, weakness  Gait is steady  No recent falls.  NEUROLOGIC: No dizziness, fainting, headache,  No change in mental status.  PSYCHIATRIC: No feelings of anxiety, depression Sleeps well.      PHYSICAL EXAM Filed Vitals:   06/09/13 1109  BP: 120/68  Pulse: 72  Height: 5' 6.75" (1.695 m)  Weight: 141 lb (63.957 kg)   Body mass index is 22.26 kg/(m^2).  GENERAL APPEARANCE: No acute distress, appropriately groomed, Frail body habitus. Alert, pleasant, conversant. SKIN: No diaphoresis, rash  Wound left lower leg continues to contract slowly, measures 4 x 5 x 1.5 cm today, there is  some exuberant granulation of wound.  No edema of the lower leg. Wound was treated with silver nitrate to control the exuberant granulation. Clinic nurse will continue 3 times a week  dressing change. HEAD: Normocephalic, atraumatic EYES: Conjunctiva/lids clear. Pupils round, reactive.   EARS: External exam WNL, poor hearing (chronic). NOSE: No deformity or discharge. MOUTH/THROAT: Lips w/o lesions. Oral mucosa, tongue moist, w/o lesion. Oropharynx w/o redness or lesions.  NECK: Supple, full ROM. No thyroid tenderness, enlargement or nodule LYMPHATICS: No head, neck or supraclavicular adenopathy RESPIRATORY: Breathing is even, unlabored. Lung sounds are clear and full. O2 Sat 95%RA CARDIOVASCULAR: Heart IRRR. No murmur or extra heart sounds  EDEMA: No peripheral edema.  GASTROINTESTINAL: Abdomen is soft, non-tender, not distended w/ normal bowel sounds. No hepatic or splenic enlargement.  MUSCULOSKELETAL: Moves all extremities with full ROM, strength and tone. Back is without kyphosis, scoliosis or spinal process tenderness. Gait is steady with walker NEUROLOGIC: Oriented to time, place, person. Speech clear, no tremor.  PSYCHIATRIC: Mood and affect appropriate to situation  ASSESSMENT/PLAN  Chronic diastolic heart failure Acute exacerbation has resolved,  patient is tolerating daily diuretic dose without difficulty, has resumed usual activity status. There has been a mild bump in his BUN and creatinine, electrolytes are stable without potassium supplement. Continue current medications and activity. He is looking forward to celebrating his 89th birthday in April of this year. He will followup with Dr. Patty Sermons as scheduled  Leg wound, left Wound left lower leg continues very slow healing.  There has been progress since the edema has been controlled in his lower leg. 1 treated with small amount of silver nitrate today to control to her granulation. Clinic nurse will continue  to do dressing changes and report any change in wound condition to me.    Follow up: 3 months  Otillia Cordone T.Remus Hagedorn, NP-C 06/09/2013

## 2013-06-09 NOTE — Assessment & Plan Note (Signed)
Wound left lower leg continues very slow healing.  There has been progress since the edema has been controlled in his lower leg. 1 treated with small amount of silver nitrate today to control to her granulation. Clinic nurse will continue to do dressing changes and report any change in wound condition to me.

## 2013-06-16 ENCOUNTER — Encounter: Payer: Self-pay | Admitting: Geriatric Medicine

## 2013-06-16 ENCOUNTER — Non-Acute Institutional Stay (SKILLED_NURSING_FACILITY): Payer: Medicare Other | Admitting: Geriatric Medicine

## 2013-06-16 DIAGNOSIS — J209 Acute bronchitis, unspecified: Secondary | ICD-10-CM

## 2013-06-16 DIAGNOSIS — I5032 Chronic diastolic (congestive) heart failure: Secondary | ICD-10-CM

## 2013-06-16 LAB — CBC AND DIFFERENTIAL
HCT: 35 % — AB (ref 41–53)
HEMOGLOBIN: 12.1 g/dL — AB (ref 13.5–17.5)
Platelets: 128 10*3/uL — AB (ref 150–399)
WBC: 5.3 10^3/mL

## 2013-06-16 LAB — BASIC METABOLIC PANEL
BUN: 33 mg/dL — AB (ref 4–21)
CREATININE: 1.2 mg/dL (ref 0.6–1.3)
GLUCOSE: 95 mg/dL
Potassium: 4.9 mmol/L (ref 3.4–5.3)
SODIUM: 135 mmol/L — AB (ref 137–147)

## 2013-06-16 NOTE — Progress Notes (Signed)
Patient ID: [name], male   DOB: [birth year], 80 y.o.   MRN: [id number]   [facility] [phone])  Code Status: Living will, DO NOT RESUSCITATE      Contact Information   Name Relation Home Work [place] Son [phone]  [phone]   [facility] Daughter [phone]  [phone]   [name] [phone]  [phone]   [facility] Significant other [phone]     Kani, Macko [phone]  [phone]       Chief Complaint  Patient presents with  . Bronchitis    HPI: This is a 80 y.o. male resident of [facility],  Independent Living  section. He was admitted to rehabilitation section last evening. After dinner he felt some discomfort in his anterior chest this subsided. He did not have any frank shortness of breath but acknowledge some difficulty with breathing breathing. His his friend Delice Bison brought him to the rehabilitation unit for evaluation. He was noted with mild hypotension and bibasilar crackles. Patient did not want to be admitted to the hospital. On-call provider was notified who recommended rehabilitation admission in order to chest x-ray and laboratory studies for the morning. Chest x-ray revealed moderate to severe bronchitis bilaterally, p.o. antibiotic was started this morning.  CBC and BMP are unremarkable, BNP is pending.    Allergies  Allergen Reactions  . Metoprolol     "dementia" "lost touch with reality per Daughter"     Medication List       This list is accurate as of: 06/16/13  1:52 PM.  Always use your most recent med list.               amLODipine 5 MG tablet  Commonly known as:  NORVASC  Take 5 mg by mouth daily.     aspirin 81 MG tablet  Take 81 mg by mouth 3 (three) times a week.     beta carotene w/minerals tablet  Take 1 tablet by mouth daily. For eyes     doxepin 25 MG capsule  Commonly known as:  SINEQUAN  Take 1 capsule (25 mg total) by mouth  at bedtime.     enalapril 2.5 MG tablet  Commonly known as:  VASOTEC  Take 2.5 mg by mouth daily.     fish oil-omega-3 fatty acids 1000 MG capsule  Take 1 g by mouth daily.     furosemide 40 MG tablet  Commonly known as:  LASIX  Take 1 tablet (40 mg total) by mouth daily.     loratadine 10 MG tablet  Commonly known as:  CLARITIN  Take 10 mg by mouth daily.     moxifloxacin 400 MG tablet  Commonly known as:  AVELOX  Take 400 mg by mouth daily at 8 pm.     polyethylene glycol packet  Commonly known as:  MIRALAX / GLYCOLAX  Take 17 g by mouth daily. Mix in 6 oz beverage in morin ing to relieve constipation     SENOKOT S 8.6-50 MG per tablet  Generic drug:  senna-docusate  Take 2 tablets by mouth. Nightly for stool softener and laxative, as needed     sodium chloride 2 % ophthalmic solution  Commonly known as:  MURO 128  Place 1 drop into the left eye 2 (two) times daily.     tamsulosin 0.4 MG Caps capsule  Commonly known as:  FLOMAX  Take 0.4 mg by mouth daily after breakfast.        DATA  REVIEWED  Radiologic Exams 05/23/2012  CHEST  2 VIEW   COMPARISON:  07/17/2011 IMPRESSION: 1. Interstitial accentuation favoring interstitial edema or atypical pneumonia. 2. Small chronic bilateral pleural effusions. 3. Bandlike opacities in the right middle lobe and lingula, favoring  scarring or atelectasis over confluent edema or pneumonia.   CT ANGIOGRAPHY CHEST WITH CONTRAST  COMPARISON:  No priors. IMPRESSION: 1. No evidence of pulmonary embolism. 2. Nodules in the lungs bilaterally, largest of which measure 2.0 x1.4 cm in the inferior aspect of the left lower lobe and 1.7 x 1.0  cm in the periphery of the right middle lobe. The right middle lobe nodule appears have a focus of internal calcification. Given the granulomatous changes in the spleen, there is a possibility that these nodules could be related to benign old granulomatous disease, however, this is a diagnosis of  exclusion. In correlation with PET-CT may be warranted if clinically appropriate. Alternatively, follow-up chest CT in 3 months would be useful to assess for the stability or growth of these lesions. Malignancy is not excluded. 3. Moderate to large bilateral pleural effusions. 4. Chronic changes of emphysema in the lungs bilaterally, possibly with some early changes related interstitial lung disease, as discussed above. 5. Atherosclerosis, including left main and 3 vessel coronary artery disease.     Quality mobile x-ray 06/16/2013  Chest x-ray: No cardiomegaly or pulmonary vascular congestion. Small bilateral pleural effusions. No inflammatory consolidate or suspicious nodules. Moderate to severe bronchitis is seen bilaterally and diffusely. Patchy bibasilar atelectasis or interstitial pneumonitis.  Cardiovascular Exams 05/24/2013  Transthoracic Echocardiography Study Conclusions - Left ventricle: The cavity size was normal. There was   moderate concentric hypertrophy. Systolic function was  normal. The estimated ejection fraction was in the range of 60% to 65%. Wall motion was normal; there were no regional wall motion abnormalities. Doppler parameters are consistent withpseudonormal left ventricular relaxation (grade2 diastolic dysfunction). The E/e' ratio is >15,  suggesting markedly elevated LV filling pressure. - Aortic valve: Moderately calcified with reduced leaflet excursion. There was no stenosis. Trace to mild regurgitation. - Aorta: The aorta was mildly calcified. - Mitral valve: Calcified annulus. Mild regurgitation. - Right atrium: The atrium was mildly dilated. - Inferior vena cava: The vessel was normal in size; the respirophasic diameter changes were in the normal range (=  50%); findings are consistent with normal central venous pressure.   Laboratory Studies Lab Results- hospital 05/2013  Component Value   WBC 5.1   HGB 12.3*   HCT 35.0*   PLT 118*       GLUCOSE 94    NA 134*   K 4.0   CL 95*   CREATININE 1.28   BUN 35*   CO2 27       TSH 1.727       ProBNP 1995.0   Lab Results- [facility] 06/08/2013  Component Value Date   GLUCOSE 90 06/04/2013   NA 138 06/08/2013   K 4.9 06/04/2013   CL 102 06/04/2013   CREATININE 1.32 06/04/2013   BUN 42 06/04/2013    REVIEW OF SYSTEMS  DATA OBTAINED: from patient, nurse, medical record GENERAL: Feels weak, tired, decreased appetite    SKIN: No itch, rash or open wounds EYES: No eye pain, dryness or itching  No change in vision EARS: No earache, change in hearing NOSE: No congestion, drainage or bleeding MOUTH/THROAT: No mouth or tooth pain  No sore throat   No difficulty chewing or swallowing RESPIRATORY: No cough, wheezing, SOB. Left  anterior chest discomfort with deep breath CARDIAC: No chest pain, palpitations  No edema. GI: No abdominal pain  No N/V/D or constipation  No heartburn or reflux  GU: No dysuria, frequency or urgency  No change in urine volume or character  MUSCULOSKELETAL: No joint pain, swelling or stiffness  No back pain  No muscle ache, pain, weakness  Gait is unsteady  No recent falls.  NEUROLOGIC: No dizziness, fainting, headache,  No change in mental status.  PSYCHIATRIC: No feelings of anxiety, depression Sleeps well.      PHYSICAL EXAM Filed Vitals:   06/16/13 1351  BP: 133/70  Pulse: 73  Temp: 96.6 F (35.9 C)  Resp: 18  Weight: 137 lb 9.6 oz (62.415 kg)  SpO2: 91%   Body mass index is 21.72 kg/(m^2).  GENERAL APPEARANCE: No acute distress, appropriately groomed, Frail body habitus. Alert, pleasant, conversant. SKIN: No diaphoresis, rash  Nurse reports Wound left lower leg continues to contract slowly,  HEAD: Normocephalic, atraumatic EYES: Conjunctiva/lids clear. Pupils round, reactive.   EARS: External exam WNL, poor hearing (chronic). NOSE: No deformity or discharge. MOUTH/THROAT: Lips w/o lesions. Oral mucosa, tongue moist, w/o lesion. Oropharynx w/o redness or  lesions.  NECK: Supple, full ROM. No thyroid tenderness, enlargement or nodule LYMPHATICS: No head, neck or supraclavicular adenopathy RESPIRATORY: Breathing is even, unlabored. Lung with left-sided rhonchi.   EDEMA: No peripheral edema.  GASTROINTESTINAL: Abdomen is soft, non-tender, not distended w/ normal bowel sounds. No hepatic or splenic enlargement.  MUSCULOSKELETAL: Moves all extremities with full ROM, strength and tone. Gait is not assessed today NEUROLOGIC: Oriented to time, place, person. Speech clear, no tremor.  PSYCHIATRIC: Mood and affect appropriate to situation  ASSESSMENT/PLAN  Acute bronchitis Radiographic evidence of bronchitis the patient is not really coughing. He is having some chest wall pain, may be pleuritic pain. Treat with p.o. antibiotic, may benefit from a short course of NSAID if chest discomfort doesn't subside.  Chronic diastolic heart failure No sign of volume overload, O2 saturation adequate. BNP is pending   Follow up: As needed   Janautica Netzley T.Marlon Vonruden, NP-C 06/16/2013

## 2013-06-17 DIAGNOSIS — J209 Acute bronchitis, unspecified: Secondary | ICD-10-CM | POA: Insufficient documentation

## 2013-06-17 NOTE — Assessment & Plan Note (Signed)
No sign of volume overload, O2 saturation adequate. BNP is pending

## 2013-06-17 NOTE — Assessment & Plan Note (Signed)
Radiographic evidence of bronchitis the patient is not really coughing. He is having some chest wall pain, may be pleuritic pain. Treat with p.o. antibiotic, may benefit from a short course of NSAID if chest discomfort doesn't subside.

## 2013-06-18 ENCOUNTER — Encounter: Payer: Self-pay | Admitting: Geriatric Medicine

## 2013-06-18 ENCOUNTER — Non-Acute Institutional Stay (SKILLED_NURSING_FACILITY): Payer: Medicare Other | Admitting: Geriatric Medicine

## 2013-06-18 DIAGNOSIS — R5381 Other malaise: Secondary | ICD-10-CM

## 2013-06-18 DIAGNOSIS — I5032 Chronic diastolic (congestive) heart failure: Secondary | ICD-10-CM

## 2013-06-18 DIAGNOSIS — J209 Acute bronchitis, unspecified: Secondary | ICD-10-CM

## 2013-06-18 NOTE — Assessment & Plan Note (Signed)
No significant coughing, though there re some reports of coughing with melas. Pt. Expresses less chest discomfort w/ deep breath, feels weak. Contine antibx., advance activity as he tolerates.

## 2013-06-18 NOTE — Assessment & Plan Note (Signed)
BNP satisfactory, no edema, lungs clear.

## 2013-06-18 NOTE — Assessment & Plan Note (Signed)
Gen. deconditioning due to acute illness and advanced age (nearly 89). This is patient's second rehabilitation admission in the last month or so. Once he recovers from this illness, do recommend additional caregiving assistance at home to supervise medications and food management. Continue to increase daily activity, anticipate he'll be ready for discharge home early next week

## 2013-06-18 NOTE — Progress Notes (Signed)
Patient ID: [name], male   DOB: September 10, 1911, 80 y.o.   MRN: [id number]   [facility] [phone](31)  Code Status: Living will, DO NOT RESUSCITATE      Contact Information   Name Relation Home Work [employer]   [name] Son [phone]  [phone]   [facility],[name] Daughter [phone]  [phone]   [name] Son [phone]  [phone]   [facility],[name] Significant other [phone]     [name] Other [phone]  [phone]       Chief Complaint  Patient presents with  . Bronchitis  . Debility    HPI: This is a 80 y.o. male resident of [facility],  Independent Living  section. He was admitted to rehabilitation section 06/15/2013 and diagnosed with bilateral bronchitis.   Last visit: Acute bronchitis Radiographic evidence of bronchitis the patient is not really coughing. He is having some chest wall pain, may be pleuritic pain. Treat with p.o. antibiotic, may benefit from a short course of NSAID if chest discomfort doesn't subside.  Chronic diastolic heart failure No sign of volume overload, O2 saturation adequate. BNP is pending  Since last visit patient has been eating and drinking well, continues to tire easily. Staff reports he is walking short distances with supervision is able to independently complete most ADLs though very slowly.  Clinic nurse continues to follow patient's slow healing wound on left leg, reports wound remains free of infection and continued heal.  Allergies  Allergen Reactions  . Metoprolol     "dementia" "lost touch with reality per Daughter"     Medication List       This list is accurate as of: 06/18/13  1:55 PM.  Always use your most recent med list.               amLODipine 5 MG tablet  Commonly known as:  NORVASC  Take 5 mg by mouth daily.     aspirin 81 MG tablet  Take 81 mg by mouth 3 (three) times a week.     beta carotene w/minerals tablet  Take 1 tablet by  mouth daily. For eyes     doxepin 25 MG capsule  Commonly known as:  SINEQUAN  Take 1 capsule (25 mg total) by mouth at bedtime.     enalapril 2.5 MG tablet  Commonly known as:  VASOTEC  Take 2.5 mg by mouth daily.     fish oil-omega-3 fatty acids 1000 MG capsule  Take 1 g by mouth daily.     furosemide 40 MG tablet  Commonly known as:  LASIX  Take 1 tablet (40 mg total) by mouth daily.     loratadine 10 MG tablet  Commonly known as:  CLARITIN  Take 10 mg by mouth daily.     moxifloxacin 400 MG tablet  Commonly known as:  AVELOX  Take 400 mg by mouth daily at 8 pm.     polyethylene glycol packet  Commonly known as:  MIRALAX / GLYCOLAX  Take 17 g by mouth daily. Mix in 6 oz beverage in morin ing to relieve constipation     SENOKOT S 8.6-50 MG per tablet  Generic drug:  senna-docusate  Take 2 tablets by mouth. Nightly for stool softener and laxative, as needed     sodium chloride 2 % ophthalmic solution  Commonly known as:  MURO 128  Place 1 drop into the left eye 2 (two) times daily.     tamsulosin 0.4 MG Caps capsule  Commonly  known as:  FLOMAX  Take 0.4 mg by mouth daily after breakfast.        DATA REVIEWED  Radiologic Exams 05/23/2012  CHEST  2 VIEW   COMPARISON:  07/17/2011 IMPRESSION: 1. Interstitial accentuation favoring interstitial edema or atypical pneumonia. 2. Small chronic bilateral pleural effusions. 3. Bandlike opacities in the right middle lobe and lingula, favoring  scarring or atelectasis over confluent edema or pneumonia.   CT ANGIOGRAPHY CHEST WITH CONTRAST  COMPARISON:  No priors. IMPRESSION: 1. No evidence of pulmonary embolism. 2. Nodules in the lungs bilaterally, largest of which measure 2.0 x1.4 cm in the inferior aspect of the left lower lobe and 1.7 x 1.0  cm in the periphery of the right middle lobe. The right middle lobe nodule appears have a focus of internal calcification. Given the granulomatous changes in the spleen, there is a  possibility that these nodules could be related to benign old granulomatous disease, however, this is a diagnosis of exclusion. In correlation with PET-CT may be warranted if clinically appropriate. Alternatively, follow-up chest CT in 3 months would be useful to assess for the stability or growth of these lesions. Malignancy is not excluded. 3. Moderate to large bilateral pleural effusions. 4. Chronic changes of emphysema in the lungs bilaterally, possibly with some early changes related interstitial lung disease, as discussed above. 5. Atherosclerosis, including left main and 3 vessel coronary artery disease.     [facility] x-ray 06/16/2013  Chest x-ray: No cardiomegaly or pulmonary vascular congestion. Small bilateral pleural effusions. No inflammatory consolidate or suspicious nodules. Moderate to severe bronchitis is seen bilaterally and diffusely. Patchy bibasilar atelectasis or interstitial pneumonitis.  Cardiovascular Exams   Laboratory Studies  Lab Results- hospital 05/2013  Component Value   WBC 5.1   HGB 12.3*   HCT 35.0*   PLT 118*       GLUCOSE 94   NA 134*   K 4.0   CL 95*   CREATININE 1.28   BUN 35*   CO2 27       TSH 1.727       ProBNP 1995.0   Lab Results- [facility] 06/08/2013  Component Value   GLUCOSE 90   NA 138   K 4.9   CL 102   CREATININE 1.32   BUN 42   Lab Results-[facility] 06/16/2013  Component Value   WBC 5.3   HGB 12.1*   HCT 35*   PLT 128*       GLUCOSE 95   NA 135*   K 4.9   CREATININE 1.2   BUN 33*       BNP 99.9    REVIEW OF SYSTEMS  DATA OBTAINED: from patient, nurse, medical record GENERAL: Feels weak, tired,  appetite  Is better SKIN: No itch, rash or open wounds EYES: No eye pain, dryness or itching  No change in vision EARS: No earache, change in hearing NOSE: No congestion, drainage or bleeding MOUTH/THROAT: No mouth or tooth pain  No sore throat   No difficulty chewing or swallowing RESPIRATORY: No cough, wheezing,  SOB. Less left anterior chest discomfort with deep breath.  Staff reports some coughing with meals CARDIAC: No chest pain, palpitations  No edema. GI: No abdominal pain  No N/V/D or constipation  No heartburn or reflux  GU: No dysuria, frequency or urgency  No change in urine volume or character  MUSCULOSKELETAL: No joint pain, swelling or stiffness  No back pain  No muscle ache, pain, weakness  Gait is unsteady  No recent falls.  NEUROLOGIC: No dizziness, fainting, headache,  No change in mental status.  PSYCHIATRIC: No feelings of anxiety, depression Sleeps well.      PHYSICAL EXAM Filed Vitals:   06/18/13 1354  BP: 137/70  Pulse: 74  Temp: 97.1 F (36.2 C)  Resp: 20  SpO2: 90%   There is no weight on file to calculate BMI.  GENERAL APPEARANCE: No acute distress, appropriately groomed, Frail body habitus. Alert, pleasant, conversant. SKIN: No diaphoresis, rash  Nurse reports Wound left lower leg continues to contract slowly,  HEAD: Normocephalic, atraumatic EYES: Conjunctiva/lids clear. Pupils round, reactive.   EARS: External exam WNL, poor hearing (chronic). NOSE: No deformity or discharge. MOUTH/THROAT: Lips w/o lesions. Oral mucosa, tongue moist, w/o lesion. Oropharynx w/o redness or lesions.  NECK: Supple, full ROM. No thyroid tenderness, enlargement or nodule LYMPHATICS: No head, neck or supraclavicular adenopathy RESPIRATORY: Breathing is even, unlabored. Lungs clear.   EDEMA: No peripheral edema.  GASTROINTESTINAL: Abdomen is soft, non-tender, not distended w/ normal bowel sounds. No hepatic or splenic enlargement.  MUSCULOSKELETAL: Moves all extremities with full ROM, strength and tone. Gait is not assessed today NEUROLOGIC: Oriented to time, place, person. Speech clear, no tremor.  PSYCHIATRIC: Mood and affect appropriate to situation  ASSESSMENT/PLAN  Chronic diastolic heart failure BNP satisfactory, no edema, lungs clear.  Acute bronchitis No significant  coughing, though there re some reports of coughing with melas. Pt. Expresses less chest discomfort w/ deep breath, feels weak. Contine antibx., advance activity as he tolerates.  Debility Gen. deconditioning due to acute illness and advanced age (nearly 89). This is patient's second rehabilitation admission in the last month or so. Once he recovers from this illness, do recommend additional caregiving assistance at home to supervise medications and food management. Continue to increase daily activity, anticipate he'll be ready for discharge home early next week   Follow up: As needed   Bhakti Labella T.Enio Hornback, NP-C 06/18/2013

## 2013-06-22 ENCOUNTER — Non-Acute Institutional Stay (SKILLED_NURSING_FACILITY): Payer: Medicare Other | Admitting: Geriatric Medicine

## 2013-06-22 ENCOUNTER — Encounter: Payer: Self-pay | Admitting: Geriatric Medicine

## 2013-06-22 DIAGNOSIS — R0602 Shortness of breath: Secondary | ICD-10-CM

## 2013-06-22 LAB — BASIC METABOLIC PANEL
BUN: 33 mg/dL — AB (ref 4–21)
CREATININE: 1.2 mg/dL (ref 0.6–1.3)
Glucose: 83 mg/dL
Potassium: 4.8 mmol/L (ref 3.4–5.3)
Sodium: 136 mmol/L — AB (ref 137–147)

## 2013-06-22 NOTE — Progress Notes (Signed)
Patient ID: Geoffrey RafterWalter F Mongeau, male   DOB: [birth year], 80 y.o.   MRN: [id number]   [facility] [phone](31)  Code Status: Living will, DO NOT RESUSCITATE      Contact Information   Name Relation Home Work [place]   Girolamo,[name] Son [phone]   [facility],[name] Daughter [phone]  [phone]   [name],[name] Son [phone]  [phone]   [facility],[name] Significant other [phone]     [name],[name] Other [phone]  [phone]       Chief Complaint  Patient presents with  . PNA    Rehab discharge  . Discharge Note    HPI: This is a 80 y.o. male resident of [facility],  Independent Living  section. He was admitted to rehabilitation section 06/15/2013 and diagnosed with bilateral bronchitis.   Last visit: Chronic diastolic heart failure BNP satisfactory, no edema, lungs clear.  Acute bronchitis No significant coughing, though there re some reports of coughing with melas. Pt. Expresses less chest discomfort w/ deep breath, feels weak. Contine antibx., advance activity as he tolerates.  Debility Gen. deconditioning due to acute illness and advanced age (nearly 89). This is patient's second rehabilitation admission in the last month or so. Once he recovers from this illness, do recommend additional caregiving assistance at home to supervise medications and food management. Continue to increase daily activity, anticipate he'll be ready for discharge home early next week  Since last visit patient was making good progress in returning to his usual level of activity. Plan was for discharge to his independent living home today however areas of increased shortness of breath. He relates a severe coughing episode during the night; he awoke coughing quite a lot, serum side of the bed for a few minutes the episode passed he set up for while in the chair and eventually went back to sleep. This morning he feels weak "but okay", he is  experiencing mild shortness of breath with any activity and he has markedly decreased appetite. Patient has been evaluated by speech therapy due to episodes of coughing with meals.   Allergies  Allergen Reactions  . Metoprolol     "dementia" "lost touch with reality per Daughter"     Medication List       This list is accurate as of: 06/22/13 11:30 AM.  Always use your most recent med list.               amLODipine 5 MG tablet  Commonly known as:  NORVASC  Take 5 mg by mouth daily.     aspirin 81 MG tablet  Take 81 mg by mouth 3 (three) times a week.     beta carotene w/minerals tablet  Take 1 tablet by mouth daily. For eyes     doxepin 25 MG capsule  Commonly known as:  SINEQUAN  Take 1 capsule (25 mg total) by mouth at bedtime.     enalapril 2.5 MG tablet  Commonly known as:  VASOTEC  Take 2.5 mg by mouth daily.     fish oil-omega-3 fatty acids 1000 MG capsule  Take 1 g by mouth daily.     furosemide 40 MG tablet  Commonly known as:  LASIX  Take 1 tablet (40 mg total) by mouth daily.     loratadine 10 MG tablet  Commonly known as:  CLARITIN  Take 10 mg by mouth daily.     polyethylene glycol packet  Commonly known as:  MIRALAX / GLYCOLAX  Take 17 g by  mouth daily. Mix in 6 oz beverage in morin ing to relieve constipation     SENOKOT S 8.6-50 MG per tablet  Generic drug:  senna-docusate  Take 2 tablets by mouth. Nightly for stool softener and laxative, as needed     sodium chloride 2 % ophthalmic solution  Commonly known as:  MURO 128  Place 1 drop into the left eye 2 (two) times daily.     tamsulosin 0.4 MG Caps capsule  Commonly known as:  FLOMAX  Take 0.4 mg by mouth daily after breakfast.        DATA REVIEWED  Radiologic Exams 05/23/2012  CHEST  2 VIEW   COMPARISON:  07/17/2011 IMPRESSION: 1. Interstitial accentuation favoring interstitial edema or atypical pneumonia. 2. Small chronic bilateral pleural effusions. 3. Bandlike opacities in  the right middle lobe and lingula, favoring  scarring or atelectasis over confluent edema or pneumonia.   CT ANGIOGRAPHY CHEST WITH CONTRAST  COMPARISON:  No priors. IMPRESSION: 1. No evidence of pulmonary embolism. 2. Nodules in the lungs bilaterally, largest of which measure 2.0 x1.4 cm in the inferior aspect of the left lower lobe and 1.7 x 1.0  cm in the periphery of the right middle lobe. The right middle lobe nodule appears have a focus of internal calcification. Given the granulomatous changes in the spleen, there is a possibility that these nodules could be related to benign old granulomatous disease, however, this is a diagnosis of exclusion. In correlation with PET-CT may be warranted if clinically appropriate. Alternatively, follow-up chest CT in 3 months would be useful to assess for the stability or growth of these lesions. Malignancy is not excluded. 3. Moderate to large bilateral pleural effusions. 4. Chronic changes of emphysema in the lungs bilaterally, possibly with some early changes related interstitial lung disease, as discussed above. 5. Atherosclerosis, including left main and 3 vessel coronary artery disease.     [facility] x-ray 06/16/2013  Chest x-ray: No cardiomegaly or pulmonary vascular congestion. Small bilateral pleural effusions. No inflammatory consolidate or suspicious nodules. Moderate to severe bronchitis is seen bilaterally and diffusely. Patchy bibasilar atelectasis or interstitial pneumonitis.  Cardiovascular Exams   Laboratory Studies  Lab Results- hospital 05/2013  Component Value   WBC 5.1   HGB 12.3*   HCT 35.0*   PLT 118*       GLUCOSE 94   NA 134*   K 4.0   CL 95*   CREATININE 1.28   BUN 35*   CO2 27       TSH 1.727       ProBNP 1995.0   Lab Results- [facility] 06/08/2013  Component Value   GLUCOSE 90   NA 138   K 4.9   CL 102   CREATININE 1.32   BUN 42   Lab Results-[facility] 06/16/2013  Component Value   WBC 5.3   HGB 12.1*     HCT 35*   PLT 128*       GLUCOSE 95   NA 135*   K 4.9   CREATININE 1.2   BUN 33*       BNP 99.9    REVIEW OF SYSTEMS  DATA OBTAINED: from patient,  GENERAL: Feels weak, tired,  Poor Appetite  SKIN: No itch, rash or open wounds EYES: No eye pain, dryness or itching  No change in vision EARS: No earache, change in hearing NOSE: No congestion, drainage or bleeding MOUTH/THROAT: No mouth or tooth pain  No sore throat   No  difficulty chewing or swallowing RESPIRATORY: No cough, wheezing.  SOB present.  CARDIAC: No chest pain, palpitations  No edema. GI: No abdominal pain  No N/V/D or constipation  Denies heartburn or reflux  GU: No dysuria, frequency or urgency  No change in urine volume or character  MUSCULOSKELETAL: No joint pain, swelling or stiffness  No back pain  No muscle ache, pain, weakness  Gait is unsteady  No recent falls.  NEUROLOGIC: No dizziness, fainting, headache,  No change in mental status.  PSYCHIATRIC: No feelings of anxiety, depression Sleeps well.      PHYSICAL EXAM Filed Vitals:   06/22/13 1128  BP: 106/60  Pulse: 80  Temp: 97.2 F (36.2 C)  Resp: 20  Weight: 142 lb 12.8 oz (64.774 kg)  SpO2: 95%   Body mass index is 22.55 kg/(m^2).  GENERAL APPEARANCE: No acute distress, appropriately groomed, Frail body habitus. Alert, pleasant, conversant. SKIN: No diaphoresis, rash  Nurse reports Wound left lower leg continues to contract slowly,  HEAD: Normocephalic, atraumatic EYES: Conjunctiva/lids clear. Pupils round, reactive.   EARS: External exam WNL, poor hearing (chronic). NOSE: No deformity or discharge. MOUTH/THROAT: Lips w/o lesions. Oral mucosa, tongue moist, w/o lesion. Oropharynx w/o redness or lesions.  NECK: Supple, full ROM. No thyroid tenderness, enlargement or nodule LYMPHATICS: No head, neck or supraclavicular adenopathy RESPIRATORY: Breathing is even, unlabored. Lungs with wheeze present on the left, O2 sat 84-87% on room air.   Improves to 91% on 2 L nasal cannula  EDEMA: No peripheral edema.  GASTROINTESTINAL: Abdomen is soft, non-tender, not distended w/ normal bowel sounds. No hepatic or splenic enlargement.  MUSCULOSKELETAL: Moves all extremities with full ROM, strength and tone. Gait is not assessed today NEUROLOGIC: Oriented to time, place, person. Speech clear, no tremor.  PSYCHIATRIC: Mood and affect appropriate to situation  ASSESSMENT/PLAN  SOB (shortness of breath) This 80 year old male patient with recurrent shortness of breath and hypoxia. He is being treated for bronchitis with Avelox, until today was improving. He did have a significant coughing episode last night. Will repeat chest x-ray and obtain stat labs for further evaluation. No discharge home today   Follow up: As needed   CXR, CBC, BMP, BNP today  Revonda Menter T.Lenora Gomes, NP-C 06/22/2013

## 2013-06-22 NOTE — Assessment & Plan Note (Signed)
This 80 year old male patient with recurrent shortness of breath and hypoxia. He is being treated for bronchitis with Avelox, until today was improving. He did have a significant coughing episode last night. Will repeat chest x-ray and obtain stat labs for further evaluation. No discharge home today

## 2013-06-28 ENCOUNTER — Non-Acute Institutional Stay (SKILLED_NURSING_FACILITY): Payer: Medicare Other | Admitting: Geriatric Medicine

## 2013-06-28 ENCOUNTER — Encounter: Payer: Self-pay | Admitting: Geriatric Medicine

## 2013-06-28 DIAGNOSIS — J8409 Other alveolar and parieto-alveolar conditions: Secondary | ICD-10-CM

## 2013-06-28 DIAGNOSIS — J849 Interstitial pulmonary disease, unspecified: Secondary | ICD-10-CM

## 2013-06-28 DIAGNOSIS — R1312 Dysphagia, oropharyngeal phase: Secondary | ICD-10-CM

## 2013-06-28 DIAGNOSIS — I5032 Chronic diastolic (congestive) heart failure: Secondary | ICD-10-CM

## 2013-06-28 NOTE — Progress Notes (Signed)
Patient ID: [name], male   DOB: 1911-10-05, [age] y.o.   MRN: [id number]   [facility] [phone](31)  Code Status: Living will, DO NOT RESUSCITATE      Contact Information   Name Relation Home Work [place]   Taite,[name] Son [phone]  [phone]   [facility],[name] Daughter [phone]  [phone]   [name],[name] Son [phone]  [phone]   [facility],[name] Significant other [phone]     [name],[name] Other [phone]  [phone]       Chief Complaint  Patient presents with  . Pneumonia  . Dysphagia    HPI: This is a 80 y.o. male resident of [facility],  Independent Living  section. He was admitted to rehabilitation section 06/15/2013 and diagnosed with bilateral bronchitis.   Last visit: SOB (shortness of breath) This 80 year old male patient with recurrent shortness of breath and hypoxia. He is being treated for bronchitis with Avelox, until today was improving. He did have a significant coughing episode last night. Will repeat chest x-ray and obtain stat labs for further evaluation. No discharge home today  Since last visit patient has been treated for pneumonia due to abnormalities on chest x-ray and general condition. He was started on Augmentin on December on February 3. He does not appear to have had any worsening of his chronic heart failure. Speech therapy evaluated patient  on February 4 due to concerns for oropharyngeal dysphagia as cause of pneumonia. She recommended modified consistency food and fluids with ground meats and gravies, mechanical soft diet and nectar thickened liquids with meals. He has been drinking thin liquids between meals. Patient reports feeling better in general however continues to require supplemental oxygen. Patient is eating only fair due to poor appetite and anon appetizing features of the modified diet. Review of facility records shows patient has lost 6 pounds in the last  week.  Allergies  Allergen Reactions  . Metoprolol     "dementia" "lost touch with reality per Daughter"     Medication List       This list is accurate as of: 06/28/13  1:39 PM.  Always use your most recent med list.               amLODipine 5 MG tablet  Commonly known as:  NORVASC  Take 5 mg by mouth daily.     amoxicillin-clavulanate 875-125 MG per tablet  Commonly known as:  AUGMENTIN  Take 1 tablet by mouth 2 (two) times daily.     aspirin 81 MG tablet  Take 81 mg by mouth 3 (three) times a week.     beta carotene w/minerals tablet  Take 1 tablet by mouth daily. For eyes     doxepin 25 MG capsule  Commonly known as:  SINEQUAN  Take 1 capsule (25 mg total) by mouth at bedtime.     enalapril 2.5 MG tablet  Commonly known as:  VASOTEC  Take 2.5 mg by mouth daily.     fish oil-omega-3 fatty acids 1000 MG capsule  Take 1 g by mouth daily.     furosemide 40 MG tablet  Commonly known as:  LASIX  Take 1 tablet (40 mg total) by mouth daily.     loratadine 10 MG tablet  Commonly known as:  CLARITIN  Take 10 mg by mouth daily.     polyethylene glycol packet  Commonly known as:  MIRALAX / GLYCOLAX  Take 17 g by mouth daily. Mix in 6 oz beverage in morin ing  to relieve constipation     SENOKOT S 8.6-50 MG per tablet  Generic drug:  senna-docusate  Take 2 tablets by mouth. Nightly for stool softener and laxative, as needed     sodium chloride 2 % ophthalmic solution  Commonly known as:  MURO 128  Place 1 drop into the left eye 2 (two) times daily.     tamsulosin 0.4 MG Caps capsule  Commonly known as:  FLOMAX  Take 0.4 mg by mouth daily after breakfast.        DATA REVIEWED  Radiologic Exams 05/23/2012  CHEST  2 VIEW   COMPARISON:  07/17/2011 IMPRESSION: 1. Interstitial accentuation favoring interstitial edema or atypical pneumonia. 2. Small chronic bilateral pleural effusions. 3. Bandlike opacities in the right middle lobe and lingula, favoring   scarring or atelectasis over confluent edema or pneumonia.   CT ANGIOGRAPHY CHEST WITH CONTRAST  COMPARISON:  No priors. IMPRESSION: 1. No evidence of pulmonary embolism. 2. Nodules in the lungs bilaterally, largest of which measure 2.0 x1.4 cm in the inferior aspect of the left lower lobe and 1.7 x 1.0  cm in the periphery of the right middle lobe. The right middle lobe nodule appears have a focus of internal calcification. Given the granulomatous changes in the spleen, there is a possibility that these nodules could be related to benign old granulomatous disease, however, this is a diagnosis of exclusion. In correlation with PET-CT may be warranted if clinically appropriate. Alternatively, follow-up chest CT in 3 months would be useful to assess for the stability or growth of these lesions. Malignancy is not excluded. 3. Moderate to large bilateral pleural effusions. 4. Chronic changes of emphysema in the lungs bilaterally, possibly with some early changes related interstitial lung disease, as discussed above. 5. Atherosclerosis, including left main and 3 vessel coronary artery disease.     [facility] x-ray 06/16/2013  Chest x-ray: No cardiomegaly or pulmonary vascular congestion. Small bilateral pleural effusions. No inflammatory consolidate or suspicious nodules. Moderate to severe bronchitis is seen bilaterally and diffusely. Patchy bibasilar atelectasis or interstitial pneumonitis.  06/22/2013  Chest x-ray: Increased pulmonary vascular congestion suggestive of worsening heart failure, bilateral effusions are noted. Patchy interstitial changes both lower lungs most consistent with pneumonia.  Cardiovascular Exams   Laboratory Studies  Lab Results-[facility] 06/16/2013  Component Value   WBC 5.3   HGB 12.1*   HCT 35*   PLT 128*       GLUCOSE 95   NA 135*   K 4.9   CREATININE 1.2   BUN 33*       BNP 99.9   Lab Results  Component Value Date   NA 136* 06/22/2013   K 4.8  06/22/2013   GLU 83 06/22/2013   BUN 33* 06/22/2013   CREATININE 1.2 06/22/2013        BNP 113.7     REVIEW OF SYSTEMS  DATA OBTAINED: from patient,  GENERAL: Feels much better than last week, appetite continues to be poor "I don't like that thickener" SKIN: No itch, rash or open wounds EYES: No eye pain, dryness or itching  No change in vision EARS: No earache, change in hearing NOSE: No congestion, drainage or bleeding MOUTH/THROAT: No mouth or tooth pain  No sore throat   No difficulty chewing or swallowing RESPIRATORY: No cough, wheezing, SOB.  CARDIAC: No chest pain, palpitations  No edema. GI: No abdominal pain  No N/V/D or constipation  Denies heartburn or reflux  GU: No dysuria, frequency  or urgency  No change in urine volume or character  MUSCULOSKELETAL: No joint pain, swelling or stiffness  No back pain  No muscle ache, pain, weakness  Gait is unsteady  No recent falls.  NEUROLOGIC: No dizziness, fainting, headache,  No change in mental status.  PSYCHIATRIC: No feelings of anxiety, depression Sleeps well.      PHYSICAL EXAM Filed Vitals:   06/28/13 1336  BP: 108/62  Pulse: 87  Temp: 96.6 F (35.9 C)  Resp: 22  Weight: 136 lb 12.8 oz (62.052 kg)  SpO2: 97%   Body mass index is 21.6 kg/(m^2).  GENERAL APPEARANCE: No acute distress, appropriately groomed, Frail body habitus. Alert, pleasant, conversant. SKIN: No diaphoresis, rash  Wound left lower leg with significant healing/contraction since last evaluated. There is significant scaling on the most distal distal portion of the wound, there is no surrounding erythema or edema.  HEAD: Normocephalic, atraumatic EYES: Conjunctiva/lids clear. Pupils round, reactive.   EARS: External exam WNL, poor hearing (chronic). NOSE: No deformity or discharge. MOUTH/THROAT: Lips w/o lesions. Oral mucosa, tongue moist, w/o lesion. Oropharynx w/o redness or lesions.  NECK: Supple, full ROM. No thyroid tenderness, enlargement or  nodule LYMPHATICS: No head, neck or supraclavicular adenopathy RESPIRATORY: Breathing is even, unlabored. Lungs with  a few rhonchi on the left, diminished sounds at both bases  O2 sat  drifts from 97-91% when supplemental oxygen is removed  EDEMA: No peripheral edema.  GASTROINTESTINAL: Abdomen is soft, non-tender, not distended w/ normal bowel sounds. No hepatic or splenic enlargement.  MUSCULOSKELETAL: Moves all extremities with full ROM, strength and tone. Gait is not assessed today NEUROLOGIC: Oriented to time, place, person. Speech clear, no tremor.  PSYCHIATRIC: Mood and affect appropriate to situation  ASSESSMENT/PLAN  Acute interstitial pneumonia Patient with evidence of interstitial pneumonia on chest x-ray last week. This was accompanied by hypoxia and increased coughing. Patient will complete course of Augmentin tomorrow. He remains oxygen dependent though much improved since last week.  Chronic diastolic heart failure Chest x-ray last week was suggestive of worsening heart failure however BNP was only mildly elevated. No changes have been made in his heart failure medications. Patient is due for cardiology followup tomorrow  Oropharyngeal dysphagia Bedside evaluation by speech therapist confirms some element of oropharyngeal dysphagia. This likely is contributing factor to bronchitis and pneumonia. The patient has been receiving altered consistent modified consistency diet for about the last 5 or 6 days. He is also working on exercises to strengthen his swallowing muscles. The diet is unappealing to him, he is not eating very well. Patient has lost 6 pounds in the last week. Will discuss with speech therapist how we can safely advance his diet to improve p.o. intake   Follow up: As needed     Weylin Plagge T.Tilla Wilborn, NP-C 06/28/2013

## 2013-06-28 NOTE — Assessment & Plan Note (Signed)
Patient with evidence of interstitial pneumonia on chest x-ray last week. This was accompanied by hypoxia and increased coughing. Patient will complete course of Augmentin tomorrow. He remains oxygen dependent though much improved since last week.

## 2013-06-28 NOTE — Assessment & Plan Note (Signed)
Bedside evaluation by speech therapist confirms some element of oropharyngeal dysphagia. This likely is contributing factor to bronchitis and pneumonia. The patient has been receiving altered consistent modified consistency diet for about the last 5 or 6 days. He is also working on exercises to strengthen his swallowing muscles. The diet is unappealing to him, he is not eating very well. Patient has lost 6 pounds in the last week. Will discuss with speech therapist how we can safely advance his diet to improve p.o. intake

## 2013-06-28 NOTE — Assessment & Plan Note (Signed)
Chest x-ray last week was suggestive of worsening heart failure however BNP was only mildly elevated. No changes have been made in his heart failure medications. Patient is due for cardiology followup tomorrow

## 2013-06-29 ENCOUNTER — Telehealth: Payer: Self-pay | Admitting: Cardiology

## 2013-06-29 ENCOUNTER — Encounter: Payer: Self-pay | Admitting: Cardiology

## 2013-06-29 ENCOUNTER — Ambulatory Visit (INDEPENDENT_AMBULATORY_CARE_PROVIDER_SITE_OTHER): Payer: Medicare Other | Admitting: Cardiology

## 2013-06-29 VITALS — BP 116/60 | HR 76 | Ht 66.0 in | Wt 138.0 lb

## 2013-06-29 DIAGNOSIS — I480 Paroxysmal atrial fibrillation: Secondary | ICD-10-CM

## 2013-06-29 DIAGNOSIS — I5032 Chronic diastolic (congestive) heart failure: Secondary | ICD-10-CM

## 2013-06-29 DIAGNOSIS — I4891 Unspecified atrial fibrillation: Secondary | ICD-10-CM

## 2013-06-29 DIAGNOSIS — J189 Pneumonia, unspecified organism: Secondary | ICD-10-CM

## 2013-06-29 NOTE — Telephone Encounter (Signed)
Spoke with daughter and will try to get him back as soon as possible

## 2013-06-29 NOTE — Patient Instructions (Signed)
Your physician recommends that you continue on your current medications as directed. Please refer to the Current Medication list given to you today.  Your physician recommends that you schedule a follow-up appointment in: 4 month ov  

## 2013-06-29 NOTE — Telephone Encounter (Signed)
New message   Daughter nancy calling    Patient been in rehab on oxygen. If all possible to take patient when he arrived on the floor  so that patient does not have to wait.  Not to be in a crowd waiting room is there a place where he can wait.

## 2013-06-29 NOTE — Progress Notes (Signed)
Elza Rafter Date of Birth:  [birth year] [address] [place], [place]  [address] [phone]         Fax   [phone]  History of Present Illness: This pleasant 80 year old gentleman is seen for a four-month followup office visit. He has a history of paroxysmal atrial fibrillation. He was hospitalized over Christmas 2012 for 10 days at [facility] because of acute pneumonia. He did not require intubation but was seriously ill with problems of disorientation and confusion while in the intensive care unit. He also was in atrial flutter with a rapid ventricular response. He has a past history of paroxysmal atrial fib but is not on Coumadin because of his age and because of the previous history of recurrent GI bleeds.  The patient was recently hospitalized for pneumonia the question of aspiration pneumonia has been raised and the patient is now in the nursing center [facility] and is getting speech therapy to help him with his swallowing.  He is still on Augmentin for his recent pulmonary infection.  On 06/03/13 the patient had worsening dyspnea and responded to an increase in Lasix dose.  He now takes 40 mg a day instead of 20.  He had an echocardiogram on 05/24/13 which showed an ejection fraction of 60-65% and diastolic dysfunction and mild mitral regurgitation.  He is on low-dose ACE inhibitor but not on beta blocker because of his bradycardia. Current Outpatient Prescriptions  Medication Sig Dispense Refill  . amLODipine (NORVASC) 5 MG tablet Take 5 mg by mouth daily.      Marland Kitchen amoxicillin-clavulanate (AUGMENTIN) 875-125 MG per tablet Take 1 tablet by mouth 2 (two) times daily.      Marland Kitchen aspirin 81 MG tablet Take 81 mg by mouth 3 (three) times a week.        . beta carotene w/minerals (OCUVITE) tablet Take 1 tablet by mouth daily. For eyes      . doxepin (SINEQUAN) 25 MG capsule Take 1 capsule (25 mg total) by mouth at bedtime.  90 capsule  3  . enalapril (VASOTEC)  2.5 MG tablet Take 2.5 mg by mouth daily.      . fish oil-omega-3 fatty acids 1000 MG capsule Take 1 g by mouth daily.       . furosemide (LASIX) 40 MG tablet Take 1 tablet (40 mg total) by mouth daily.  90 tablet  1  . loratadine (CLARITIN) 10 MG tablet Take 10 mg by mouth daily.      . polyethylene glycol (MIRALAX / GLYCOLAX) packet Take 17 g by mouth daily. Mix in 6 oz beverage in morin ing to relieve constipation      . senna-docusate (SENOKOT S) 8.6-50 MG per tablet Take 2 tablets by mouth. Nightly for stool softener and laxative, as needed      . sodium chloride (MURO 128) 2 % ophthalmic solution Place 1 drop into the left eye 2 (two) times daily.      . tamsulosin (FLOMAX) 0.4 MG CAPS capsule Take 0.4 mg by mouth daily after breakfast.      . [DISCONTINUED] levalbuterol (XOPENEX HFA) 45 MCG/ACT inhaler Inhale 1-2 puffs into the lungs every 6 (six) hours as needed for wheezing.  1 Inhaler    . [DISCONTINUED] metoprolol tartrate (LOPRESSOR) 25 MG tablet Take 1 tablet (25 mg total) by mouth 2 (two) times daily.      . [DISCONTINUED] nitroGLYCERIN (NITROSTAT) 0.4 MG SL tablet Place 0.4 mg under the  tongue every 5 (five) minutes as needed.         No current facility-administered medications for this visit.    Allergies  Allergen Reactions  . Metoprolol     "dementia" "lost touch with reality per Daughter"    Patient Active Problem List   Diagnosis Date Noted  . Oropharyngeal dysphagia 06/28/2013  . SOB (shortness of breath) 06/22/2013  . Debility 06/18/2013  . Acute bronchitis 06/17/2013  . Chronic diastolic heart failure 06/09/2013  . Acute on chronic diastolic heart failure 05/27/2013  . Congestive heart failure 05/23/2013  . Respiratory failure with hypoxia 05/23/2013  . Leg wound, left 05/23/2013  . Post-nasal drainage 05/02/2013  . BPH (benign prostatic hyperplasia)   . Cellulitis 03/03/2013  . Callus of foot 03/03/2013  . Open wound, lower leg 03/02/2013  . Acute  systolic heart failure 09/23/2012  . Unspecified constipation 09/23/2012  . Unspecified hereditary and idiopathic peripheral neuropathy 09/23/2012  . Unspecified essential hypertension 09/23/2012  . Atrial flutter 07/01/2012  . Diverticulosis of colon (without mention of hemorrhage) 07/01/2012  . PAF (paroxysmal atrial fibrillation) 10/24/2011  . Acute interstitial pneumonia 05/29/2011  . Thrombocytopenia 05/15/2011  . Community acquired pneumonia 05/14/2011  . NSTEMI (non-ST elevated myocardial infarction) 05/14/2011  . Inguinal hernia, left. 04/17/2011  . Atrial fibrillation 10/24/2010  . Benign hypertensive heart disease without heart failure 10/24/2010  . Chronic ischemic heart disease 10/24/2010    History  Smoking status  . Former Smoker  . Quit date: 05/20/1974  Smokeless tobacco  . Never Used    History  Alcohol Use  . Yes    Comment: 2 oz daily     Family History  Problem Relation Age of Onset  . Heart attack Father   . Heart disease Father   . Rheumatologic disease Mother   . Cancer Sister     colon  . Cancer Sister     lung    Review of Systems: Constitutional: no fever chills diaphoresis or fatigue or change in weight.  Head and neck: no hearing loss, no epistaxis, no photophobia or visual disturbance. Respiratory: No cough, shortness of breath or wheezing. Cardiovascular: No chest pain peripheral edema, palpitations. Gastrointestinal: No abdominal distention, no abdominal pain, no change in bowel habits hematochezia or melena. Genitourinary: No dysuria, no frequency, no urgency, no nocturia. Musculoskeletal:No arthralgias, no back pain, no gait disturbance or myalgias. Neurological: No dizziness, no headaches, no numbness, no seizures, no syncope, no weakness, no tremors. Hematologic: No lymphadenopathy, no easy bruising. Psychiatric: No confusion, no hallucinations, no sleep disturbance.    Physical Exam: Filed Vitals:   06/29/13 1628  BP:  116/60  Pulse: 76   the general appearance reveals a well-developed well-nourished very elderly gentleman in no distress.  He is still very mentally alert.The head and neck exam reveals pupils equal and reactive.  Extraocular movements are full.  There is no scleral icterus.  The mouth and pharynx are normal.  The neck is supple.  The carotids reveal no bruits.  The jugular venous pressure is normal.  The  thyroid is not enlarged.  There is no lymphadenopathy.  The chest is clear to percussion   There are mild basilar rhonchi.  Expansion of the chest is symmetrical.  The precordium is quiet.  The first heart sound is normal.  The second heart sound is physiologically split.  The pulse is irregularly irregular  There is no murmur gallop rub or click.  There is no abnormal lift or heave.  The abdomen is soft and nontender.  The bowel sounds are normal.  The liver and spleen are not enlarged.  There are no abdominal masses.  There are no abdominal bruits.  Extremities reveal good pedal pulses.  There is no phlebitis or edema.  There is no cyanosis or clubbing.  Strength is normal and symmetrical in all extremities.  There is no lateralizing weakness.  There are no sensory deficits.  The skin is warm and dry.  There is no rash.     Assessment / Plan: Continue same medication.  Continue with physical therapy and speech therapy.  We talked about how important it was for him not to get more aspiration pneumonias.  Continue on higher dose of Lasix 40 mg daily for diastolic dysfunction. Recheck in 4 months for office visit and EKG.  His weight is down 8 pounds since October and his family is hopeful that his appetite and food intake will improve

## 2013-06-30 ENCOUNTER — Encounter: Payer: Self-pay | Admitting: Geriatric Medicine

## 2013-07-01 ENCOUNTER — Non-Acute Institutional Stay (SKILLED_NURSING_FACILITY): Payer: Medicare Other | Admitting: Geriatric Medicine

## 2013-07-01 ENCOUNTER — Encounter: Payer: Self-pay | Admitting: Geriatric Medicine

## 2013-07-01 DIAGNOSIS — R1312 Dysphagia, oropharyngeal phase: Secondary | ICD-10-CM

## 2013-07-01 DIAGNOSIS — J849 Interstitial pulmonary disease, unspecified: Secondary | ICD-10-CM

## 2013-07-01 DIAGNOSIS — I5032 Chronic diastolic (congestive) heart failure: Secondary | ICD-10-CM

## 2013-07-01 DIAGNOSIS — J8409 Other alveolar and parieto-alveolar conditions: Secondary | ICD-10-CM

## 2013-07-01 DIAGNOSIS — R5381 Other malaise: Secondary | ICD-10-CM

## 2013-07-01 NOTE — Assessment & Plan Note (Signed)
Cough resolved, no longer O2 dependent. Increase activity as tolerated

## 2013-07-01 NOTE — Assessment & Plan Note (Signed)
Remains w/o signs of volume overload, does tire easily. Continue current dose of diuretic, update lab prior to next clinic visit

## 2013-07-01 NOTE — Assessment & Plan Note (Addendum)
Diet advanced to mech. Soft, chopped meats. Thin liquids- tolerating w/o coughing. Continue at home. ST will follow

## 2013-07-01 NOTE — Progress Notes (Signed)
Patient ID: [name], male   DOB: Nov 08, 1911, 80 y.o.   MRN: [id number]   [facility] [phone](31)  Code Status: Living will, DO NOT RESUSCITATE      Contact Information   Name Relation Home Work [place]   [name],[name] Son [phone]  [phone]   [facility] Hospital[facility]ek,[name] Daughter [phone]  [phone]   Geoffrey HooverWiliams,[name] Son [phone]  [phone]   [facility]Williams,[name] Significant other [phone]     Geoffrey SentersWilliams,[name] Other [phone]  [phone]       Chief Complaint  Patient presents with  . Discharge Note    HPI: This is a 80 y.o. male resident of [facility],  Independent Living  section. He was admitted to rehabilitation section 06/15/2013 and diagnosed/tx with bilateral bronchitis/pneumonia.   Last visit: Acute interstitial pneumonia Patient with evidence of interstitial pneumonia on chest x-ray last week. This was accompanied by hypoxia and increased coughing. Patient will complete course of Augmentin tomorrow. He remains oxygen dependent though much improved since last week.  Chronic diastolic heart failure Chest x-ray last week was suggestive of worsening heart failure however BNP was only mildly elevated. No changes have been made in his heart failure medications. Patient is due for cardiology followup tomorrow  Oropharyngeal dysphagia Bedside evaluation by speech therapist confirms some element of oropharyngeal dysphagia. This likely is contributing factor to bronchitis and pneumonia. The patient has been receiving altered consistent modified consistency diet for about the last 5 or 6 days. He is also working on exercises to strengthen his swallowing muscles. The diet is unappealing to him, he is not eating very well. Patient has lost 6 pounds in the last week. Will discuss with speech therapist how we can safely advance his diet to improve p.o. Intake   Since last visit the patient has completed course of p.o. Antibiotic, has  been weaned off supplemental oxygen. Nurse reports O2 saturation 95% on room air.  Speech therapist has advanced patient's diet to mechanical soft, chopped meats and thin liquids. Patient reports this is much more palatable. Patient's weight is unchanged though he reports having more of an appetite. Patient is ambulating and performing ADLs independently. He does remark that he tires easily. Patient returned to Dr. Patty SermonsBrackbill earlier this week for routine followup. No medication changes were recommended; continue torsemide 40 mg daily, return in 4 months.   Allergies  Allergen Reactions  . Metoprolol     "dementia" "lost touch with reality per Daughter"     Medication List       This list is accurate as of: 07/01/13 12:13 PM.  Always use your most recent med list.               amLODipine 5 MG tablet  Commonly known as:  NORVASC  Take 5 mg by mouth daily.     aspirin 81 MG tablet  Take 81 mg by mouth 3 (three) times a week.     beta carotene w/minerals tablet  Take 1 tablet by mouth daily. For eyes     doxepin 25 MG capsule  Commonly known as:  SINEQUAN  Take 1 capsule (25 mg total) by mouth at bedtime.     enalapril 2.5 MG tablet  Commonly known as:  VASOTEC  Take 2.5 mg by mouth daily.     furosemide 40 MG tablet  Commonly known as:  LASIX  Take 1 tablet (40 mg total) by mouth daily.     loratadine 10 MG tablet  Commonly known as:  CLARITIN  Take 10 mg by mouth daily.     polyethylene glycol packet  Commonly known as:  MIRALAX / GLYCOLAX  Take 17 g by mouth daily. Mix in 6 oz beverage in morin ing to relieve constipation     SENOKOT S 8.6-50 MG per tablet  Generic drug:  senna-docusate  Take 2 tablets by mouth. Nightly for stool softener and laxative, as needed     sodium chloride 2 % ophthalmic solution  Commonly known as:  MURO 128  Place 1 drop into the left eye 2 (two) times daily.     tamsulosin 0.4 MG Caps capsule  Commonly known as:  FLOMAX  Take 0.4  mg by mouth daily after breakfast.        DATA REVIEWED  Radiologic Exams 05/23/2012  CHEST  2 VIEW   COMPARISON:  07/17/2011 IMPRESSION: 1. Interstitial accentuation favoring interstitial edema or atypical pneumonia. 2. Small chronic bilateral pleural effusions. 3. Bandlike opacities in the right middle lobe and lingula, favoring  scarring or atelectasis over confluent edema or pneumonia.   CT ANGIOGRAPHY CHEST WITH CONTRAST  COMPARISON:  No priors. IMPRESSION: 1. No evidence of pulmonary embolism. 2. Nodules in the lungs bilaterally, largest of which measure 2.0 x1.4 cm in the inferior aspect of the left lower lobe and 1.7 x 1.0  cm in the periphery of the right middle lobe. The right middle lobe nodule appears have a focus of internal calcification. Given the granulomatous changes in the spleen, there is a possibility that these nodules could be related to benign old granulomatous disease, however, this is a diagnosis of exclusion. In correlation with PET-CT may be warranted if clinically appropriate. Alternatively, follow-up chest CT in 3 months would be useful to assess for the stability or growth of these lesions. Malignancy is not excluded. 3. Moderate to large bilateral pleural effusions. 4. Chronic changes of emphysema in the lungs bilaterally, possibly with some early changes related interstitial lung disease, as discussed above. 5. Atherosclerosis, including left main and 3 vessel coronary artery disease.     [facility] mobile x-ray 06/16/2013  Chest x-ray: No cardiomegaly or pulmonary vascular congestion. Small bilateral pleural effusions. No inflammatory consolidate or suspicious nodules. Moderate to severe bronchitis is seen bilaterally and diffusely. Patchy bibasilar atelectasis or interstitial pneumonitis.  06/22/2013  Chest x-ray: Increased pulmonary vascular congestion suggestive of worsening heart failure, bilateral effusions are noted. Patchy interstitial changes both  lower lungs most consistent with pneumonia.  Cardiovascular Exams   Laboratory Studies  Lab Results-[facility] 06/16/2013  Component Value   WBC 5.3   HGB 12.1*   HCT 35*   PLT 128*       GLUCOSE 95   NA 135*   K 4.9   CREATININE 1.2   BUN 33*       BNP 99.9   Lab Results  Component Value Date   NA 136* 06/22/2013   K 4.8 06/22/2013   GLU 83 06/22/2013   BUN 33* 06/22/2013   CREATININE 1.2 06/22/2013        BNP 113.7     REVIEW OF SYSTEMS  DATA OBTAINED: from patient,  GENERAL: Feels well, tires easily, appetite a little better SKIN: No itch, rash  EYES: No eye pain, dryness or itching  No change in vision EARS: No earache, change in hearing NOSE: No congestion, drainage or bleeding MOUTH/THROAT: No mouth or tooth pain    No sore throat     No difficulty chewing or swallowing RESPIRATORY: No cough,  wheezing, SOB.  CARDIAC: No chest pain, palpitations  No edema. GI: No abdominal pain  No N/V/D or constipation  Denies heartburn or reflux  GU: No dysuria, frequency or urgency  No change in urine volume or character  MUSCULOSKELETAL: No joint pain, swelling or stiffness  No back pain  No muscle ache, pain, weakness  Gait is unsteady, uses walker  No recent falls.  NEUROLOGIC: No dizziness, fainting, headache,  No change in mental status.  PSYCHIATRIC: No feelings of anxiety, depression  Sleeps well.      PHYSICAL EXAM Filed Vitals:   07/01/13 1205  BP: 114/67  Pulse: 71  Temp: 97 F (36.1 C)  Resp: 16  Weight: 137 lb (62.143 kg)  SpO2: 95%   Body mass index is 22.12 kg/(m^2).  GENERAL APPEARANCE: No acute distress, appropriately groomed, Frail body habitus. Alert, pleasant, conversant. SKIN: No diaphoresis, rash  Wound left lower leg with significant healing/contraction since last evaluated. There is significant scabbing on the most distal distal portion of the wound, there is no surrounding erythema or edema.  HEAD: Normocephalic, atraumatic EYES: Conjunctiva/lids  clear. Pupils round, reactive.   EARS: External exam WNL, poor hearing (chronic). NOSE: No deformity or discharge. MOUTH/THROAT: Lips w/o lesions. Oral mucosa, tongue moist, w/o lesion. Oropharynx w/o redness or lesions.  NECK: Supple, full ROM. No thyroid tenderness, enlargement or nodule LYMPHATICS: No head, neck or supraclavicular adenopathy RESPIRATORY: Breathing is even, unlabored. Lungs clear today O2 sat  89-91% RA   EDEMA: No peripheral edema.  GASTROINTESTINAL: Abdomen is soft, non-tender, not distended w/ normal bowel sounds. No hepatic or splenic enlargement.  MUSCULOSKELETAL: Moves all extremities with full ROM, strength and tone. Gait is steady with walker NEUROLOGIC: Oriented to time, place, person. Speech clear, no tremor.  PSYCHIATRIC: Mood and affect appropriate to situation  ASSESSMENT/PLAN - [place] to [place] when caregivers in place. Anticipate 07/02/2013.  Chronic diastolic heart failure Remains w/o signs of volume overload, does tire easily. Continue current dose of diuretic, update lab prior to next clinic visit  Acute interstitial pneumonia Cough resolved, no longer O2 dependent. Increase activity as tolerated  Oropharyngeal dysphagia Diet advanced to mech. Soft, chopped meats. Thin liquids- tolerating w/o coughing. Continue at home. ST will follow  Debility Remains woith osme deconditioning, continue OT/PT interventions. Increase activity as tolerated. Will benefit from caregivers at home ot monitor meal intake.   Follow up:  Return in about 4 weeks (around 07/28/2013) for OV, HF, rehab follow up.     Toribio Harbour, NP-C [facility] [phone]  07/01/2013

## 2013-07-01 NOTE — Assessment & Plan Note (Signed)
Remains woith osme deconditioning, continue OT/PT interventions. Increase activity as tolerated. Will benefit from caregivers at home ot monitor meal intake.

## 2013-07-07 ENCOUNTER — Inpatient Hospital Stay (HOSPITAL_COMMUNITY)
Admission: EM | Admit: 2013-07-07 | Discharge: 2013-07-12 | DRG: 291 | Disposition: A | Discharge status: Rehabilitation Facility | Payer: Medicare Other | Attending: Family Medicine | Admitting: Family Medicine

## 2013-07-07 ENCOUNTER — Encounter (HOSPITAL_COMMUNITY): Payer: Self-pay | Admitting: Emergency Medicine

## 2013-07-07 DIAGNOSIS — R918 Other nonspecific abnormal finding of lung field: Secondary | ICD-10-CM

## 2013-07-07 DIAGNOSIS — J449 Chronic obstructive pulmonary disease, unspecified: Secondary | ICD-10-CM | POA: Diagnosis present

## 2013-07-07 DIAGNOSIS — J849 Interstitial pulmonary disease, unspecified: Secondary | ICD-10-CM

## 2013-07-07 DIAGNOSIS — J9621 Acute and chronic respiratory failure with hypoxia: Secondary | ICD-10-CM

## 2013-07-07 DIAGNOSIS — I509 Heart failure, unspecified: Secondary | ICD-10-CM

## 2013-07-07 DIAGNOSIS — I252 Old myocardial infarction: Secondary | ICD-10-CM

## 2013-07-07 DIAGNOSIS — J811 Chronic pulmonary edema: Secondary | ICD-10-CM

## 2013-07-07 DIAGNOSIS — Z66 Do not resuscitate: Secondary | ICD-10-CM | POA: Diagnosis present

## 2013-07-07 DIAGNOSIS — R5381 Other malaise: Secondary | ICD-10-CM

## 2013-07-07 DIAGNOSIS — I2510 Atherosclerotic heart disease of native coronary artery without angina pectoris: Secondary | ICD-10-CM | POA: Diagnosis present

## 2013-07-07 DIAGNOSIS — Z96649 Presence of unspecified artificial hip joint: Secondary | ICD-10-CM

## 2013-07-07 DIAGNOSIS — Z9981 Dependence on supplemental oxygen: Secondary | ICD-10-CM

## 2013-07-07 DIAGNOSIS — I5021 Acute systolic (congestive) heart failure: Secondary | ICD-10-CM

## 2013-07-07 DIAGNOSIS — J90 Pleural effusion, not elsewhere classified: Secondary | ICD-10-CM

## 2013-07-07 DIAGNOSIS — I119 Hypertensive heart disease without heart failure: Secondary | ICD-10-CM

## 2013-07-07 DIAGNOSIS — L0390 Cellulitis, unspecified: Secondary | ICD-10-CM

## 2013-07-07 DIAGNOSIS — K5730 Diverticulosis of large intestine without perforation or abscess without bleeding: Secondary | ICD-10-CM

## 2013-07-07 DIAGNOSIS — Z7982 Long term (current) use of aspirin: Secondary | ICD-10-CM

## 2013-07-07 DIAGNOSIS — J209 Acute bronchitis, unspecified: Secondary | ICD-10-CM

## 2013-07-07 DIAGNOSIS — S81809A Unspecified open wound, unspecified lower leg, initial encounter: Secondary | ICD-10-CM

## 2013-07-07 DIAGNOSIS — Z87891 Personal history of nicotine dependence: Secondary | ICD-10-CM

## 2013-07-07 DIAGNOSIS — N400 Enlarged prostate without lower urinary tract symptoms: Secondary | ICD-10-CM

## 2013-07-07 DIAGNOSIS — R0602 Shortness of breath: Secondary | ICD-10-CM

## 2013-07-07 DIAGNOSIS — I10 Essential (primary) hypertension: Secondary | ICD-10-CM

## 2013-07-07 DIAGNOSIS — I428 Other cardiomyopathies: Secondary | ICD-10-CM | POA: Diagnosis present

## 2013-07-07 DIAGNOSIS — I4892 Unspecified atrial flutter: Secondary | ICD-10-CM

## 2013-07-07 DIAGNOSIS — J9620 Acute and chronic respiratory failure, unspecified whether with hypoxia or hypercapnia: Secondary | ICD-10-CM | POA: Diagnosis present

## 2013-07-07 DIAGNOSIS — R1312 Dysphagia, oropharyngeal phase: Secondary | ICD-10-CM

## 2013-07-07 DIAGNOSIS — I5033 Acute on chronic diastolic (congestive) heart failure: Principal | ICD-10-CM

## 2013-07-07 DIAGNOSIS — R0982 Postnasal drip: Secondary | ICD-10-CM

## 2013-07-07 DIAGNOSIS — T17908A Unspecified foreign body in respiratory tract, part unspecified causing other injury, initial encounter: Secondary | ICD-10-CM

## 2013-07-07 DIAGNOSIS — Z79899 Other long term (current) drug therapy: Secondary | ICD-10-CM

## 2013-07-07 DIAGNOSIS — I259 Chronic ischemic heart disease, unspecified: Secondary | ICD-10-CM

## 2013-07-07 DIAGNOSIS — G609 Hereditary and idiopathic neuropathy, unspecified: Secondary | ICD-10-CM

## 2013-07-07 DIAGNOSIS — D696 Thrombocytopenia, unspecified: Secondary | ICD-10-CM

## 2013-07-07 DIAGNOSIS — J9691 Respiratory failure, unspecified with hypoxia: Secondary | ICD-10-CM

## 2013-07-07 DIAGNOSIS — K5900 Constipation, unspecified: Secondary | ICD-10-CM

## 2013-07-07 DIAGNOSIS — I5032 Chronic diastolic (congestive) heart failure: Secondary | ICD-10-CM

## 2013-07-07 DIAGNOSIS — J4489 Other specified chronic obstructive pulmonary disease: Secondary | ICD-10-CM | POA: Diagnosis present

## 2013-07-07 DIAGNOSIS — L84 Corns and callosities: Secondary | ICD-10-CM

## 2013-07-07 DIAGNOSIS — I214 Non-ST elevation (NSTEMI) myocardial infarction: Secondary | ICD-10-CM

## 2013-07-07 DIAGNOSIS — J189 Pneumonia, unspecified organism: Secondary | ICD-10-CM

## 2013-07-07 DIAGNOSIS — Z8673 Personal history of transient ischemic attack (TIA), and cerebral infarction without residual deficits: Secondary | ICD-10-CM

## 2013-07-07 DIAGNOSIS — I480 Paroxysmal atrial fibrillation: Secondary | ICD-10-CM

## 2013-07-07 DIAGNOSIS — S81802A Unspecified open wound, left lower leg, initial encounter: Secondary | ICD-10-CM

## 2013-07-07 DIAGNOSIS — I4891 Unspecified atrial fibrillation: Secondary | ICD-10-CM

## 2013-07-07 NOTE — ED Notes (Signed)
Pt arrives via EMS from [facility]. Rehab reports difficulty breathing and swallowing. Pt states he has only been experiencing shortness of breath. Hx CHF. Pt o2 dependent. 4L [address]. BP 134/86 HR 78 irregular. LW 20.

## 2013-07-08 ENCOUNTER — Inpatient Hospital Stay (HOSPITAL_COMMUNITY): Payer: Medicare Other

## 2013-07-08 ENCOUNTER — Emergency Department (HOSPITAL_COMMUNITY): Payer: Medicare Other

## 2013-07-08 DIAGNOSIS — R918 Other nonspecific abnormal finding of lung field: Secondary | ICD-10-CM | POA: Diagnosis present

## 2013-07-08 DIAGNOSIS — R0902 Hypoxemia: Secondary | ICD-10-CM

## 2013-07-08 DIAGNOSIS — R0602 Shortness of breath: Secondary | ICD-10-CM

## 2013-07-08 DIAGNOSIS — J90 Pleural effusion, not elsewhere classified: Secondary | ICD-10-CM | POA: Diagnosis present

## 2013-07-08 DIAGNOSIS — J9621 Acute and chronic respiratory failure with hypoxia: Secondary | ICD-10-CM | POA: Diagnosis present

## 2013-07-08 DIAGNOSIS — T17908A Unspecified foreign body in respiratory tract, part unspecified causing other injury, initial encounter: Secondary | ICD-10-CM | POA: Diagnosis present

## 2013-07-08 DIAGNOSIS — I4891 Unspecified atrial fibrillation: Secondary | ICD-10-CM

## 2013-07-08 DIAGNOSIS — R1312 Dysphagia, oropharyngeal phase: Secondary | ICD-10-CM

## 2013-07-08 DIAGNOSIS — J9620 Acute and chronic respiratory failure, unspecified whether with hypoxia or hypercapnia: Secondary | ICD-10-CM

## 2013-07-08 DIAGNOSIS — I5033 Acute on chronic diastolic (congestive) heart failure: Principal | ICD-10-CM

## 2013-07-08 LAB — CBC WITH DIFFERENTIAL/PLATELET
Basophils Absolute: 0 10*3/uL (ref 0.0–0.1)
Basophils Relative: 1 % (ref 0–1)
EOS PCT: 4 % (ref 0–5)
Eosinophils Absolute: 0.2 10*3/uL (ref 0.0–0.7)
HEMATOCRIT: 34.7 % — AB (ref 39.0–52.0)
Hemoglobin: 12.3 g/dL — ABNORMAL LOW (ref 13.0–17.0)
LYMPHS ABS: 0.7 10*3/uL (ref 0.7–4.0)
Lymphocytes Relative: 13 % (ref 12–46)
MCH: 33.9 pg (ref 26.0–34.0)
MCHC: 35.4 g/dL (ref 30.0–36.0)
MCV: 95.6 fL (ref 78.0–100.0)
MONO ABS: 0.6 10*3/uL (ref 0.1–1.0)
MONOS PCT: 12 % (ref 3–12)
Neutro Abs: 3.7 10*3/uL (ref 1.7–7.7)
Neutrophils Relative %: 71 % (ref 43–77)
PLATELETS: 161 10*3/uL (ref 150–400)
RBC: 3.63 MIL/uL — AB (ref 4.22–5.81)
RDW: 14.6 % (ref 11.5–15.5)
WBC: 5.3 10*3/uL (ref 4.0–10.5)

## 2013-07-08 LAB — BASIC METABOLIC PANEL
BUN: 28 mg/dL — AB (ref 6–23)
BUN: 29 mg/dL — AB (ref 6–23)
CALCIUM: 8.8 mg/dL (ref 8.4–10.5)
CHLORIDE: 93 meq/L — AB (ref 96–112)
CO2: 25 mEq/L (ref 19–32)
CO2: 27 meq/L (ref 19–32)
Calcium: 8.8 mg/dL (ref 8.4–10.5)
Chloride: 94 mEq/L — ABNORMAL LOW (ref 96–112)
Creatinine, Ser: 1.11 mg/dL (ref 0.50–1.35)
Creatinine, Ser: 1.13 mg/dL (ref 0.50–1.35)
GFR calc Af Amer: 60 mL/min — ABNORMAL LOW (ref >90)
GFR calc non Af Amer: 52 mL/min — ABNORMAL LOW (ref >90)
GFR, EST AFRICAN AMERICAN: 61 mL/min — AB (ref >90)
GFR, EST NON AFRICAN AMERICAN: 53 mL/min — AB (ref >90)
GLUCOSE: 100 mg/dL — AB (ref 70–99)
Glucose, Bld: 105 mg/dL — ABNORMAL HIGH (ref 70–99)
POTASSIUM: 4.3 meq/L (ref 3.7–5.3)
Potassium: 4.5 mEq/L (ref 3.7–5.3)
SODIUM: 132 meq/L — AB (ref 137–147)
SODIUM: 132 meq/L — AB (ref 137–147)

## 2013-07-08 LAB — CBC
HEMATOCRIT: 36.7 % — AB (ref 39.0–52.0)
Hemoglobin: 12.9 g/dL — ABNORMAL LOW (ref 13.0–17.0)
MCH: 33.9 pg (ref 26.0–34.0)
MCHC: 35.1 g/dL (ref 30.0–36.0)
MCV: 96.6 fL (ref 78.0–100.0)
PLATELETS: 165 10*3/uL (ref 150–400)
RBC: 3.8 MIL/uL — ABNORMAL LOW (ref 4.22–5.81)
RDW: 14.8 % (ref 11.5–15.5)
WBC: 5 10*3/uL (ref 4.0–10.5)

## 2013-07-08 LAB — PRO B NATRIURETIC PEPTIDE: Pro B Natriuretic peptide (BNP): 704.7 pg/mL — ABNORMAL HIGH (ref 0–450)

## 2013-07-08 LAB — POCT I-STAT TROPONIN I: Troponin i, poc: 0.02 ng/mL (ref 0.00–0.08)

## 2013-07-08 LAB — TROPONIN I: Troponin I: <0.3 ng/mL (ref <0.30)

## 2013-07-08 LAB — MRSA PCR SCREENING: MRSA BY PCR: NEGATIVE

## 2013-07-08 MED ORDER — SODIUM CHLORIDE 0.9 % IJ SOLN
3.0000 mL | Freq: Two times a day (BID) | INTRAMUSCULAR | Status: DC
  Administered 2013-07-08 – 2013-07-12 (×7): 3 mL via INTRAVENOUS

## 2013-07-08 MED ORDER — SODIUM CHLORIDE 0.9 % IJ SOLN: 3.0000 mL | INTRAMUSCULAR | Status: DC | PRN

## 2013-07-08 MED ORDER — FUROSEMIDE 10 MG/ML IJ SOLN
40.0000 mg | Freq: Every day | INTRAMUSCULAR | Status: DC
  Administered 2013-07-08 – 2013-07-09 (×2): 40 mg via INTRAVENOUS
  Filled 2013-07-08 (×2): qty 4

## 2013-07-08 MED ORDER — ASPIRIN EC 81 MG PO TBEC
81.0000 mg | DELAYED_RELEASE_TABLET | Freq: Every day | ORAL | Status: DC
  Administered 2013-07-08 – 2013-07-12 (×5): 81 mg via ORAL
  Filled 2013-07-08 (×5): qty 1

## 2013-07-08 MED ORDER — ENALAPRIL MALEATE 2.5 MG PO TABS
2.5000 mg | ORAL_TABLET | Freq: Every day | ORAL | Status: DC
  Administered 2013-07-08 – 2013-07-12 (×5): 2.5 mg via ORAL
  Filled 2013-07-08 (×5): qty 1

## 2013-07-08 MED ORDER — ENOXAPARIN SODIUM 30 MG/0.3ML ~~LOC~~ SOLN
30.0000 mg | SUBCUTANEOUS | Status: DC
  Administered 2013-07-08 – 2013-07-12 (×5): 30 mg via SUBCUTANEOUS
  Filled 2013-07-08 (×5): qty 0.3

## 2013-07-08 MED ORDER — FUROSEMIDE 10 MG/ML IJ SOLN
40.0000 mg | Freq: Once | INTRAMUSCULAR | Status: AC
  Administered 2013-07-08: 40 mg via INTRAVENOUS

## 2013-07-08 MED ORDER — PIPERACILLIN-TAZOBACTAM 3.375 G IVPB
3.3750 g | Freq: Three times a day (TID) | INTRAVENOUS | Status: DC
  Administered 2013-07-08 – 2013-07-09 (×5): 3.375 g via INTRAVENOUS
  Filled 2013-07-08 (×7): qty 50

## 2013-07-08 MED ORDER — IPRATROPIUM-ALBUTEROL 0.5-2.5 (3) MG/3ML IN SOLN
3.0000 mL | Freq: Once | RESPIRATORY_TRACT | Status: AC
  Administered 2013-07-08: 3 mL via RESPIRATORY_TRACT
  Filled 2013-07-08: qty 3

## 2013-07-08 MED ORDER — RESOURCE THICKENUP CLEAR PO POWD
ORAL | Status: DC | PRN
  Filled 2013-07-08: qty 125

## 2013-07-08 MED ORDER — DOXEPIN HCL 25 MG PO CAPS
25.0000 mg | ORAL_CAPSULE | Freq: Every day | ORAL | Status: DC
  Administered 2013-07-08 – 2013-07-11 (×4): 25 mg via ORAL
  Filled 2013-07-08 (×5): qty 1

## 2013-07-08 MED ORDER — FUROSEMIDE 10 MG/ML IJ SOLN
80.0000 mg | Freq: Once | INTRAMUSCULAR | Status: DC
  Filled 2013-07-08: qty 8

## 2013-07-08 MED ORDER — SODIUM CHLORIDE 0.9 % IV SOLN: 250.0000 mL | INTRAVENOUS | Status: DC | PRN

## 2013-07-08 MED ORDER — ASPIRIN 81 MG PO TABS: 81.0000 mg | ORAL_TABLET | ORAL | Status: DC

## 2013-07-08 MED ORDER — SODIUM CHLORIDE (HYPERTONIC) 5 % OP SOLN
1.0000 [drp] | Freq: Two times a day (BID) | OPHTHALMIC | Status: DC
  Administered 2013-07-08 – 2013-07-12 (×9): 1 [drp] via OPHTHALMIC
  Filled 2013-07-08: qty 15

## 2013-07-08 MED ORDER — AMLODIPINE BESYLATE 5 MG PO TABS
5.0000 mg | ORAL_TABLET | Freq: Every day | ORAL | Status: DC
  Administered 2013-07-08 – 2013-07-09 (×2): 5 mg via ORAL
  Filled 2013-07-08 (×2): qty 1

## 2013-07-08 MED ORDER — VANCOMYCIN HCL IN DEXTROSE 750-5 MG/150ML-% IV SOLN
750.0000 mg | INTRAVENOUS | Status: DC
  Administered 2013-07-08 – 2013-07-09 (×2): 750 mg via INTRAVENOUS
  Filled 2013-07-08 (×3): qty 150

## 2013-07-08 MED ORDER — SPIRONOLACTONE 25 MG PO TABS
25.0000 mg | ORAL_TABLET | Freq: Every day | ORAL | Status: DC
  Administered 2013-07-08 – 2013-07-09 (×2): 25 mg via ORAL
  Filled 2013-07-08 (×2): qty 1

## 2013-07-08 MED ORDER — PREDNISONE 20 MG PO TABS
60.0000 mg | ORAL_TABLET | Freq: Once | ORAL | Status: AC
  Administered 2013-07-08: 60 mg via ORAL
  Filled 2013-07-08: qty 3

## 2013-07-08 MED ORDER — POLYETHYLENE GLYCOL 3350 17 G PO PACK
17.0000 g | PACK | Freq: Every day | ORAL | Status: DC
  Administered 2013-07-08 – 2013-07-11 (×4): 17 g via ORAL
  Filled 2013-07-08 (×5): qty 1

## 2013-07-08 MED ORDER — SODIUM CHLORIDE 0.9 % IJ SOLN
3.0000 mL | Freq: Two times a day (BID) | INTRAMUSCULAR | Status: DC
  Administered 2013-07-08: 3 mL via INTRAVENOUS

## 2013-07-08 MED ORDER — SODIUM CHLORIDE (HYPERTONIC) 2 % OP SOLN
1.0000 [drp] | Freq: Two times a day (BID) | OPHTHALMIC | Status: DC
  Filled 2013-07-08: qty 15

## 2013-07-08 MED ORDER — TAMSULOSIN HCL 0.4 MG PO CAPS
0.4000 mg | ORAL_CAPSULE | Freq: Every day | ORAL | Status: DC
  Administered 2013-07-08 – 2013-07-12 (×5): 0.4 mg via ORAL
  Filled 2013-07-08 (×6): qty 1

## 2013-07-08 NOTE — Procedures (Signed)
Objective Swallowing Evaluation: Modified Barium Swallowing Study  Patient Details  Name: [name] MRN: [id number] Date of Birth: Mar 18, 1912  Today's Date: 07/08/2013 Time: 2330-0762 SLP Time Calculation (min): 34 min  Past Medical History:  Past Medical History  Diagnosis Date  . Atrial fibrillation     Not on Coumadin due to recurrent, severe GIB  . H/O: GI bleed   . Inguinal hernia   . Spinal stenosis     S/p surgery   . MI, old 88  . Diverticulitis   . BPH (benign prostatic hyperplasia)   . Anemia   . Arthritis   . Blood transfusion   . CHF (congestive heart failure)   . Hypertension   . Heart attack 1976  . Unspecified hereditary and idiopathic peripheral neuropathy 03/25/2012  . Corns and callosities 12/18/2011  . Pain in joint, ankle and foot 11/20/2011  . Chronic airway obstruction, not elsewhere classified 07/17/2011  . Other dyspnea and respiratory abnormality 07/17/2011  . Other specified cardiac dysrhythmias(427.89) 06/04/2011  . Typhoid fever 05/29/2011  . Macular degeneration (senile) of retina, unspecified 05/29/2011  . Senile cataract, unspecified 05/29/2011  . Pericarditis, acute 05/29/2011  . Acute systolic heart failure 05/29/2011  . Unspecified venous (peripheral) insufficiency 05/29/2011  . Duodenal ulcer 05/29/2011  . Osteoarthrosis, unspecified whether generalized or localized, pelvic region and thigh 05/29/2011  . Other and unspecified disc disorder of lumbar region 05/29/2011  . Spasm of muscle 05/29/2011  . Abnormality of gait 05/29/2011  . Closed fracture of pubis 05/29/2011  . Transient ischemic attack (TIA), and cerebral infarction without residual deficits(V12.54) 2013  . Coronary atherosclerosis of native coronary artery 2013  . Pneumonia, organism unspecified 2013  . Unspecified constipation 2013  . Debility, unspecified 05/24/2011  . Unspecified essential hypertension 09/23/2012  . Chronic diastolic heart failure 06/09/2013   Past Surgical History:  Past  Surgical History  Procedure Laterality Date  . Spine surgery      Spinal stenosis surgery with Dr. Darrelyn Hillock   . Cholecystectomy    . Total hip arthroplasty  2002    Right   . Vein surgery  2007    leg  . Inguinal hernia repair  2003    RIH  . Appendectomy    . Tonsillectomy and adenoidectomy     HPI:  80 yo male h/o diastolic chf, gib, cafib, TIAs, silent aspiration, preserved systolic function EF over 60% per echo last month, HTN, PUD comes in from short term rehab with one week of worsening sob especially with exertion.  He was hospitalized last month with chf exac and pna. At that time was living alone, but went to short term rehab. He has been in/out rehab since. Since his d/c last month he has been treated several times for pna in addition to abx from d/c he has also had a round of avelox and then augmentin for concerns of aspiration pna. His lasix was also increased to 40 mg po daily which he has been taking. He does cough sometimes when he eats, and his liquids have been thickened recently along with mechanical soft food. He did have a cxr done within the last week which was compared to his cxr in [place] which showed pneumonitis vs edema with worsening new bilateral pleural effusions left >right, and worse edema no focal infiltrate specifically noted.      Assessment / Plan / Recommendation Clinical Impression  Dysphagia Diagnosis: Moderate pharyngeal phase dysphagia;Mild oral phase dysphagia Clinical impression: Patient present with a  mild oral and a moderate sensorimotor pharyngeal dysphagia. Oral delays noted with soft solids only however transit remains functional. Combination of delay in swallow initiation, base of tongue weakness, and decreased hyo-laryngeal excursion result in decreased airway protection with thin liquids despite therapuetic intervention for various head postures and a supraglottic swallow.  Additionally, cues for hard throat clear unreliable in clearance of laryngeal  vestibule. Patient with improved airway protection with solids and trials of nectar thick liquids however presence of moderate-severe vallecular residuals, minimizing with multiple dry swallows,  do increase risk. Educated patient regarding above. Patient receptive to use of thickened liquids to mitigate risks at this time. SLP will f/u at bedside for diet tolerance and potential for advancement. Suspect some degree of chronic dysphagia based on current function and advanced age, however may note some improvement with clearance of infection and re-conditioning.      Treatment Recommendation  Therapy as outlined in treatment plan below    Diet Recommendation Dysphagia 3 (Mechanical Soft);Nectar-thick liquid   Liquid Administration via: Cup;No straw Medication Administration: Crushed with puree Supervision: Patient able to self feed;Full supervision/cueing for compensatory strategies Compensations: Slow rate;Small sips/bites;Multiple dry swallows after each bite/sip Postural Changes and/or Swallow Maneuvers: Seated upright 90 degrees;Upright 30-60 min after meal    Other  Recommendations Oral Care Recommendations: Oral care BID Other Recommendations: Order thickener from pharmacy;Prohibited food (jello, ice cream, thin soups);Remove water pitcher   Follow Up Recommendations  Skilled Nursing facility    Frequency and Duration min 2x/week  2 weeks        General HPI: 80 yo male h/o diastolic chf, gib, cafib, TIAs, silent aspiration, preserved systolic function EF over 60% per echo last month, HTN, PUD comes in from short term rehab with one week of worsening sob especially with exertion.  He was hospitalized last month with chf exac and pna. At that time was living alone, but went to short term rehab. He has been in/out rehab since. Since his d/c last month he has been treated several times for pna in addition to abx from d/c he has also had a round of avelox and then augmentin for concerns of  aspiration pna. His lasix was also increased to 40 mg po daily which he has been taking. He does cough sometimes when he eats, and his liquids have been thickened recently along with mechanical soft food. He did have a cxr done within the last week which was compared to his cxr in [place] which showed pneumonitis vs edema with worsening new bilateral pleural effusions left >right, and worse edema no focal infiltrate specifically noted.  Type of Study: Modified Barium Swallowing Study Reason for Referral: Objectively evaluate swallowing function [facility] Swallow [facility] and placed on thickened liquids, unable to specify however suspect honey thick Diet Prior to this Study: Dysphagia 2 (chopped);Honey-thick liquids Temperature Spikes Noted: No Respiratory Status: Nasal cannula History of Recent Intubation: No Behavior/Cognition: Alert;Cooperative;Pleasant mood Oral Cavity - Dentition: Adequate natural dentition Oral Motor / Sensory Function: Impaired motor (mild lingual discoordination) Self-Feeding Abilities: Able to feed self Patient Positioning: Upright in chair Baseline Vocal Quality: Clear Volitional Cough: Strong Volitional Swallow: Able to elicit Anatomy: Within functional limits Pharyngeal Secretions: Not observed secondary MBS    Reason for Referral Objectively evaluate swallowing function   Oral Phase Oral Preparation/Oral Phase Oral Phase: WFL (except mild oral transit delays with soft solids)   Pharyngeal Phase Pharyngeal Phase Pharyngeal Phase: Impaired Pharyngeal - Nectar Pharyngeal - Nectar  Teaspoon: Delayed swallow initiation;Premature spillage to valleculae;Pharyngeal residue - valleculae;Pharyngeal residue - pyriform sinuses;Reduced tongue base retraction;Reduced anterior laryngeal mobility Pharyngeal - Nectar Cup: Delayed swallow initiation;Premature spillage to valleculae;Pharyngeal residue - valleculae;Pharyngeal residue - pyriform  sinuses;Reduced tongue base retraction;Reduced anterior laryngeal mobility Pharyngeal - Thin Pharyngeal - Thin Teaspoon: Delayed swallow initiation;Pharyngeal residue - valleculae;Pharyngeal residue - pyriform sinuses;Reduced tongue base retraction;Reduced anterior laryngeal mobility;Premature spillage to pyriform sinuses;Reduced airway/laryngeal closure;Penetration/Aspiration before swallow;Penetration/Aspiration during swallow (chin tuck, head turn left/right unsuccessful) Penetration/Aspiration details (thin teaspoon): Material enters airway, CONTACTS cords and not ejected out Pharyngeal - Thin Cup: Delayed swallow initiation;Pharyngeal residue - valleculae;Pharyngeal residue - pyriform sinuses;Reduced tongue base retraction;Reduced anterior laryngeal mobility;Premature spillage to pyriform sinuses;Reduced airway/laryngeal closure;Penetration/Aspiration during swallow (supraglottic swallow unsuccessful) Penetration/Aspiration details (thin cup): Material enters airway, CONTACTS cords and not ejected out Pharyngeal - Solids Pharyngeal - Puree: Delayed swallow initiation;Premature spillage to valleculae;Pharyngeal residue - valleculae;Reduced tongue base retraction;Reduced anterior laryngeal mobility Pharyngeal - Mechanical Soft: Delayed swallow initiation;Premature spillage to valleculae;Pharyngeal residue - valleculae;Reduced tongue base retraction;Reduced anterior laryngeal mobility  Cervical Esophageal Phase    GO   Ferdinand Lango MA, CCC-SLP [phone]  Cervical Esophageal Phase Cervical Esophageal Phase: [facility]         Zafir Schauer Meryl 07/08/2013, 10:56 AM

## 2013-07-08 NOTE — ED Notes (Signed)
Admitting physician at bedside

## 2013-07-08 NOTE — H&P (Signed)
PCP:   Kimber RelicGREEN, ARTHUR G, MD   Chief Complaint:  Sob worse with exertion  HPI: 80 yo male h/o diastolic chf, gib, cafib, silent aspiration, preserved systolic function EF over 60% per echo last month, HTN, PUD comes in from short term rehab with one week of worsening sob especially with exertion.  Pt normally walks around pretty well with a walker but has found this more difficult over the last several days.  He was hospitalized last month with chf exac and pna.  At that time was living alone, but went to short term rehab.  He has been in/out rehab since.  Since his d/c last month he has been treated several times for pna in addition to abx from d/c he has also had a round of avelox and then augmentin for concerns of aspiration pna.  His lasix was also increased to 40 mg po daily which he has been taking.  He denies any fevers.  No chills.  No coughing.  No swelling in legs.  No chest pain.  He does cough sometimes when he eats, and his liquids have been thickened recently along with mechanical soft food.  He has been told he needs a modified barium swallow study but has not had this done yet.  He denies any hemoptysis.  He does not know of any diagnosis of copd, and does not use any inhalers chronically.  He did see his cardiologist on feb 10, dr blackwell and was noted to be doing well, his lasix was continued at 40mg  daily.  Yesterday his doctor at rehab added spirinolactone to his meds.    He did have a cxr done within the last week which was [place] which showed pneumonitis vs edema with worsening new bilateral pleural effusions left >right, and worse edema no focal infiltrate specifically noted.  pts daughter is present with him now.  Pt states he feels some better now in ED, has not really diuresed much since given lasix 40mg  iv over an hour ago.  Pt ans daughter are aware of lung nodules noted on CTA done last month.  Review of Systems:  Positive and negative as per HPI  otherwise all other systems are negative  Past Medical History: Past Medical History  Diagnosis Date  . Atrial fibrillation     Not on Coumadin due to recurrent, severe GIB  . H/O: GI bleed   . Inguinal hernia   . Spinal stenosis     S/p surgery   . MI, old [id number]  . Diverticulitis   . BPH (benign prostatic hyperplasia)   . Anemia   . Arthritis   . Blood transfusion   . CHF (congestive heart failure)   . Hypertension   . Heart attack 1976  . Unspecified hereditary and idiopathic peripheral neuropathy 03/25/2012  . Corns and callosities 12/18/2011  . Pain in joint, ankle and foot 11/20/2011  . Chronic airway obstruction, not elsewhere classified 07/17/2011  . Other dyspnea and respiratory abnormality 07/17/2011  . Other specified cardiac dysrhythmias(427.89) 06/04/2011  . Typhoid fever 05/29/2011  . Macular degeneration (senile) of retina, unspecified 05/29/2011  . Senile cataract, unspecified 05/29/2011  . Pericarditis, acute 05/29/2011  . Acute systolic heart failure 05/29/2011  . Unspecified venous (peripheral) insufficiency 05/29/2011  . Duodenal ulcer 05/29/2011  . Osteoarthrosis, unspecified whether generalized or localized, pelvic region and thigh 05/29/2011  . Other and unspecified disc disorder of lumbar region 05/29/2011  . Spasm of muscle 05/29/2011  .  Abnormality of gait 05/29/2011  . Closed fracture of pubis 05/29/2011  . Transient ischemic attack (TIA), and cerebral infarction without residual deficits(V12.54) 2013  . Coronary atherosclerosis of native coronary artery 2013  . Pneumonia, organism unspecified 2013  . Unspecified constipation 2013  . Debility, unspecified 05/24/2011  . Unspecified essential hypertension 09/23/2012  . Chronic diastolic heart failure 06/09/2013   Past Surgical History  Procedure Laterality Date  . Spine surgery      Spinal stenosis surgery with Dr. Darrelyn Hillock   . Cholecystectomy    . Total hip arthroplasty  2002    Right   . Vein surgery  2007    leg  .  Inguinal hernia repair  2003    RIH  . Appendectomy    . Tonsillectomy and adenoidectomy      Medications: Prior to Admission medications   Medication Sig Start Date End Date Taking? Authorizing Provider  amLODipine (NORVASC) 5 MG tablet Take 5 mg by mouth daily.   Yes Historical Provider, MD  aspirin 81 MG tablet Take 81 mg by mouth 3 (three) times a week.     Yes Historical Provider, MD  doxepin (SINEQUAN) 25 MG capsule Take 1 capsule (25 mg total) by mouth at bedtime. 12/08/12  Yes Cassell Clement, MD  enalapril (VASOTEC) 2.5 MG tablet Take 2.5 mg by mouth daily.   Yes Historical Provider, MD  furosemide (LASIX) 40 MG tablet Take 1 tablet (40 mg total) by mouth daily. 05/26/13  Yes Penny Pia, MD  polyethylene glycol (MIRALAX / GLYCOLAX) packet Take 17 g by mouth daily. Mix in 6 oz beverage in morin ing to relieve constipation   Yes Historical Provider, MD  senna-docusate (SENOKOT S) 8.6-50 MG per tablet Take 2 tablets by mouth. Nightly for stool softener and laxative, as needed   Yes Historical Provider, MD  sodium chloride (MURO 128) 2 % ophthalmic solution Place 1 drop into the left eye 2 (two) times daily.   Yes Historical Provider, MD  spironolactone (ALDACTONE) 25 MG tablet Take 25 mg by mouth daily.   Yes Historical Provider, MD  tamsulosin (FLOMAX) 0.4 MG CAPS capsule Take 0.4 mg by mouth daily after breakfast.   Yes Historical Provider, MD    Allergies:   Allergies  Allergen Reactions  . Metoprolol     "dementia" "lost touch with reality per Daughter"    Social History:  reports that he quit smoking about 39 years ago. He has never used smokeless tobacco. He reports that he drinks alcohol. He reports that he does not use illicit drugs.  Family History: Family History  Problem Relation Age of Onset  . Heart attack Father   . Heart disease Father   . Rheumatologic disease Mother   . Cancer Sister     colon  . Cancer Sister     lung    Physical Exam: Filed  Vitals:   07/08/13 0200 07/08/13 0224 07/08/13 0300 07/08/13 0330  BP: 117/98 108/54 120/51 125/73  Pulse: 103 86 88 108  Temp:      TempSrc:      Resp: 21 20 18 17   Height:      Weight:      SpO2: 92% 94% 98% 96%   General appearance: alert, cooperative and mild distress frail Head: Normocephalic, without obvious abnormality, atraumatic Eyes: negative Nose: Nares normal. Septum midline. Mucosa normal. No drainage or sinus tenderness. Neck: no JVD and supple, symmetrical, trachea midline Lungs: diminished breath sounds LLL, good air movement  no wheezes/rhonchi/rales or crackles Heart: regular rate and rhythm, S1, S2 normal, no murmur, click, rub or gallop Abdomen: soft, non-tender; bowel sounds normal; no masses,  no organomegaly Extremities: extremities normal, atraumatic, no cyanosis or edema Pulses: 2+ and symmetric Skin: Skin color, texture, turgor normal. No rashes or lesions Neurologic: Grossly normal   Labs on Admission:   Recent Labs  07/07/13 2356  NA 132*  K 4.5  CL 94*  CO2 27  GLUCOSE 100*  BUN 29*  CREATININE 1.13  CALCIUM 8.8    Recent Labs  07/07/13 2356  WBC 5.3  NEUTROABS 3.7  HGB 12.3*  HCT 34.7*  MCV 95.6  PLT 161    Radiological Exams on Admission: Dg Chest Port 1 View  07/08/2013   CLINICAL DATA:  Short of breath  EXAM: PORTABLE CHEST - 1 VIEW  COMPARISON:  CT ANGIO CHEST W/CM &/OR WO/CM dated 05/23/2013; DG CHEST 2 VIEW dated 05/23/2013  FINDINGS: Normal cardiac silhouette. There is interval increase in bilateral pleural effusions seen on prior. There is mild central venous congestion and pulmonary edema at the bases.  IMPRESSION: Increasing bilateral pleural effusions and basilar pulmonary edema   Electronically Signed   By: Genevive Bi M.D.   On: 07/08/2013 00:13    Assessment/Plan 80 yo male with doe/sob worse over the last week with worsening bilateral effusions of uncertain etiology  Principal Problem:   Acute on chronic  respiratory failure with hypoxia with worsening bilateral pleural effusions over the last couple weeks also- suspect this is more related to his heart failure but very unclear, however it also sounds like he has been having significant aspiration.  Will cover for hcap/asp pna, obtain blood cultures and order mbss.  Will also place on lasix 40 mg iv daily (his renal function has been doing good with increase in his lasix dosing last month), serial cardiac enzymes.  Would call his cardiologist tomorrow, will not repeat echo at this time unless his cardiac enzymes rise.  His bnp is actually much less than it was last month.  spirinolactone was added just yesterday and will continue this also.  Also to consider is the pulm nodules found last month on his CTA, less likely this is underlying malignancy but pt really does not wish to pursue that w/u anyway at this time.  Hopefully we can get him to feeling a little better with above treatment.  Could consider therapeutic and diagnostic thoracentesis also if he does not improve.  He is doing well on his chronic 2 Liters oxygen at this time.  He did receive some prednisone in ED, but has no h/o copd and was not wheezing on my exam, so will not continue at this time.  Active Problems:   PAF (paroxysmal atrial fibrillation)   Unspecified essential hypertension   Acute on chronic diastolic heart failure   Oropharyngeal dysphagia   SOB (shortness of breath) on exertion   Aspiration, chronic pulmonary   Indeterminate pulmonary nodules   Pleural effusion, bilateral  Have had long discussion with patient and his daughter.  He still has a very good quality of life, with still normal cognitive function and is excited he will be turning 80 years old in 2 months!!  He does not wish to have aggressive measures such as CPR or intubation ever in the future under no circumstances, so his DNR status will be continued.  He wishes to try above treatment to get his breathing  better to hopefully maintain his quality  as long as possible.  He really does not want malignancy w/u as possible underlying cause but is sort of on the fence about that.  He is requesting Dr. Penny Pia again during this hospitalization, as he was so happy with him during last months hospitalization, this has been passed along to our flow manager.    Adaley Kiene A 07/08/2013, 4:00 AM

## 2013-07-08 NOTE — Progress Notes (Signed)
Patient seen and admitted earlier this a.m.   We'll plan on reassessing next a.m.  [name], [employer]

## 2013-07-08 NOTE — Care Management Note (Addendum)
  Page 2 of 2   07/13/2013     2:44:47 PM   CARE MANAGEMENT NOTE 07/13/2013  Patient:  [name], [name]   Account Number:  [id number]  Date Initiated:  07/08/2013  Documentation initiated by:  [facility]  Subjective/Objective Assessment:   80 yo male Acute on chronic respiratory failure with hypoxia with worsening bilateral pleural effusions over the last couple weeks also- suspect this is more r/t his heart failure but very unclear, however it also .//From [facility]     Action/Plan:   Will cover for hcap/asp pna, obtain blood cultures and order mbss.  Will also place on lasix 40 mg iv daily (his renal function has been doing good with increase in his lasix dosing last month), serial cardiac enzymes/ Return to [place]   Anticipated DC Date:  07/10/2013   Anticipated DC Plan:  ASSISTED LIVING / REST HOME  In-house referral  Clinical Social Worker      DC Planning Services  CM consult      Choice offered to / List presented to:             Status of service:  Completed, signed off Medicare Important Message given?  YES (If response is "NO", the following Medicare IM given date fields will be blank) Date Medicare IM given:  07/07/2013 Date Additional Medicare IM given:  07/12/2013  Discharge Disposition:  REST HOME  Per UR Regulation:    If discussed at Long Length of Stay Meetings, dates discussed:   07/13/2013    Comments:  07/12/13 1120 Oletta Cohn, RN, BSN, [place] [phone] Pt plan to return to [facility] at D/C.  07/08/13 1430 Oletta Cohn, RN, BSN, NCM Contact Information:    Name                                 Relation Home Mobile   [place]                      Son [phone]   [name]                    Daughter [phone]   [name] [phone]

## 2013-07-08 NOTE — ED Provider Notes (Signed)
CSN: [id number]     Arrival date & time 07/07/13  2311 History   First MD Initiated Contact with Patient 07/07/13 2320     Chief Complaint  Patient presents with  . Shortness of Breath     (Consider location/radiation/quality/duration/timing/severity/associated sxs/prior Treatment) HPI This is a late entry.   This patient is a pleasant elderly man with multiple chronic medical problems including cardiomyopathy and COPD. He presents from SNF with SOB. Patient says he developed SOB about 1 hr pta. He endorses both wheezing and cough. Denies chest pain. No documented fever. Patient has not appreciated orthopnea nor change to dependent edema.   Past Medical History  Diagnosis Date  . Atrial fibrillation     Not on Coumadin due to recurrent, severe GIB  . H/O: GI bleed   . Inguinal hernia   . Spinal stenosis     S/p surgery   . MI, old 40  . Diverticulitis   . BPH (benign prostatic hyperplasia)   . Anemia   . Arthritis   . Blood transfusion   . CHF (congestive heart failure)   . Hypertension   . Heart attack 1976  . Unspecified hereditary and idiopathic peripheral neuropathy 03/25/2012  . Corns and callosities 12/18/2011  . Pain in joint, ankle and foot 11/20/2011  . Chronic airway obstruction, not elsewhere classified 07/17/2011  . Other dyspnea and respiratory abnormality 07/17/2011  . Other specified cardiac dysrhythmias(427.89) 06/04/2011  . Typhoid fever 05/29/2011  . Macular degeneration (senile) of retina, unspecified 05/29/2011  . Senile cataract, unspecified 05/29/2011  . Pericarditis, acute 05/29/2011  . Acute systolic heart failure 05/29/2011  . Unspecified venous (peripheral) insufficiency 05/29/2011  . Duodenal ulcer 05/29/2011  . Osteoarthrosis, unspecified whether generalized or localized, pelvic region and thigh 05/29/2011  . Other and unspecified disc disorder of lumbar region 05/29/2011  . Spasm of muscle 05/29/2011  . Abnormality of gait 05/29/2011  . Closed fracture of pubis  05/29/2011  . Transient ischemic attack (TIA), and cerebral infarction without residual deficits(V12.54) 2013  . Coronary atherosclerosis of native coronary artery 2013  . Pneumonia, organism unspecified 2013  . Unspecified constipation 2013  . Debility, unspecified 05/24/2011  . Unspecified essential hypertension 09/23/2012  . Chronic diastolic heart failure 06/09/2013   Past Surgical History  Procedure Laterality Date  . Spine surgery      Spinal stenosis surgery with Dr. Darrelyn Hillock   . Cholecystectomy    . Total hip arthroplasty  2002    Right   . Vein surgery  2007    leg  . Inguinal hernia repair  2003    RIH  . Appendectomy    . Tonsillectomy and adenoidectomy     Family History  Problem Relation Age of Onset  . Heart attack Father   . Heart disease Father   . Rheumatologic disease Mother   . Cancer Sister     colon  . Cancer Sister     lung   History  Substance Use Topics  . Smoking status: Former Smoker    Quit date: 05/20/1974  . Smokeless tobacco: Never Used  . Alcohol Use: Yes     Comment: 2 oz daily     Review of Systems Ten point review of symptoms performed and is negative with the exception of symptoms noted above.     Allergies  Metoprolol  Home Medications   Current Outpatient Rx  Name  Route  Sig  Dispense  Refill  . amLODipine (NORVASC) 5 MG tablet  Oral   Take 5 mg by mouth daily.         Marland Kitchen. aspirin 81 MG tablet   Oral   Take 81 mg by mouth 3 (three) times a week.           . beta carotene w/minerals (OCUVITE) tablet   Oral   Take 1 tablet by mouth daily. For eyes         . doxepin (SINEQUAN) 25 MG capsule   Oral   Take 1 capsule (25 mg total) by mouth at bedtime.   90 capsule   3   . enalapril (VASOTEC) 2.5 MG tablet   Oral   Take 2.5 mg by mouth daily.         . furosemide (LASIX) 40 MG tablet   Oral   Take 1 tablet (40 mg total) by mouth daily.   90 tablet   1     MED CHANGE   . loratadine (CLARITIN) 10 MG  tablet   Oral   Take 10 mg by mouth daily.         . polyethylene glycol (MIRALAX / GLYCOLAX) packet   Oral   Take 17 g by mouth daily. Mix in 6 oz beverage in morin ing to relieve constipation         . senna-docusate (SENOKOT S) 8.6-50 MG per tablet   Oral   Take 2 tablets by mouth. Nightly for stool softener and laxative, as needed         . sodium chloride (MURO 128) 2 % ophthalmic solution   Left Eye   Place 1 drop into the left eye 2 (two) times daily.         . tamsulosin (FLOMAX) 0.4 MG CAPS capsule   Oral   Take 0.4 mg by mouth daily after breakfast.          BP 128/65  Pulse 72  Temp(Src) 97.9 F (36.6 C) (Oral)  Resp 24  Ht 5\' 6"  (1.676 m)  Wt 140 lb (63.504 kg)  BMI 22.61 kg/m2  SpO2 98% Physical Exam  Gen: well developed and well nourished appearing Head: NCAT Eyes: PERL, EOMI Nose: no epistaixis or rhinorrhea Mouth/throat: mucosa is moist and pink Neck: supple, no stridor, + JVD Lungs: RR 28 to 32/min, bibasilar rales, scattered but diffuse wheezing, mild accessory muscle use CV: RRR, no murmur, extremities appear well perfused.  Abd: soft, notender, nondistended Back: no ttp, no cva ttp Skin: warm and dry Ext: normal to inspection, no dependent edema Neuro: CN ii-xii grossly intact, no focal deficits Psyche; normal affect,  calm and cooperative.   ED Course  Procedures (including critical care time) Labs Review  Results for orders placed during the hospital encounter of 07/07/13 (from the past 24 hour(s))  MRSA PCR SCREENING     Status: None   Collection Time    07/08/13  5:39 AM      Result Value Ref Range   MRSA by PCR NEGATIVE  NEGATIVE  CBC     Status: Abnormal   Collection Time    07/08/13  5:55 AM      Result Value Ref Range   WBC 5.0  4.0 - 10.5 K/uL   RBC 3.80 (*) 4.22 - 5.81 MIL/uL   Hemoglobin 12.9 (*) 13.0 - 17.0 g/dL   HCT 16.136.7 (*) 09.639.0 - 04.552.0 %   MCV 96.6  78.0 - 100.0 fL   MCH 33.9  26.0 - 34.0 pg  MCHC 35.1   30.0 - 36.0 g/dL   RDW 16.1  09.6 - 04.5 %   Platelets 165  150 - 400 K/uL  BASIC METABOLIC PANEL     Status: Abnormal   Collection Time    07/08/13  5:55 AM      Result Value Ref Range   Sodium 132 (*) 137 - 147 mEq/L   Potassium 4.3  3.7 - 5.3 mEq/L   Chloride 93 (*) 96 - 112 mEq/L   CO2 25  19 - 32 mEq/L   Glucose, Bld 105 (*) 70 - 99 mg/dL   BUN 28 (*) 6 - 23 mg/dL   Creatinine, Ser 4.09  0.50 - 1.35 mg/dL   Calcium 8.8  8.4 - 81.1 mg/dL   GFR calc non Af Amer 53 (*) >90 mL/min   GFR calc Af Amer 61 (*) >90 mL/min  TROPONIN I     Status: None   Collection Time    07/08/13  5:55 AM      Result Value Ref Range   Troponin I <0.30  <0.30 ng/mL  TROPONIN I     Status: None   Collection Time    07/08/13 10:58 AM      Result Value Ref Range   Troponin I <0.30  <0.30 ng/mL  TROPONIN I     Status: None   Collection Time    07/08/13  7:32 PM      Result Value Ref Range   Troponin I <0.30  <0.30 ng/mL    EKG: AF with 3:1 block and rate in the 70s, no acute ischemic changes, normal intervals, normal axis, wide qrs complex  Imaging Review Dg Chest Port 1 View  07/08/2013   CLINICAL DATA:  Short of breath  EXAM: PORTABLE CHEST - 1 VIEW  COMPARISON:  CT ANGIO CHEST W/CM &/OR WO/CM dated 05/23/2013; DG CHEST 2 VIEW dated 05/23/2013  FINDINGS: Normal cardiac silhouette. There is interval increase in bilateral pleural effusions seen on prior. There is mild central venous congestion and pulmonary edema at the bases.  IMPRESSION: Increasing bilateral pleural effusions and basilar pulmonary edema   Electronically Signed   By: Genevive Bi M.D.   On: 07/08/2013 00:13     MDM   CRITICAL CARE Performed by: Brandt Loosen   Total critical care time: 14m  Critical care time was exclusive of separately billable procedures and treating other patients.  Critical care was necessary to treat or prevent imminent or life-threatening deterioration.  Critical care was time spent personally by me  on the following activities: development of treatment plan with patient and/or surrogate as well as nursing, discussions with consultants, evaluation of patient's response to treatment, examination of patient, obtaining history from patient or surrogate, ordering and performing treatments and interventions, ordering and review of laboratory studies, ordering and review of radiographic studies, pulse oximetry and re-evaluation of patient's condition.  Patient presented in acute respiratory distress. Etiology appears to be combination of decompensated heart failure with pulmonary edema and COPD excacerbation. We have treated with steroid, Albuterol and lasix. Patient has had minimal diuresis. Case discussed with Dr. Onalee Hua who will see and admit.     Brandt Loosen, MD 07/09/13 [phone]

## 2013-07-08 NOTE — Plan of Care (Signed)
Problem: Phase II Progression Outcomes Goal: Wean O2 if indicated Outcome: Not Applicable Date Met:  70/35/00 Pt is home 02 dependent at 3 L n/c

## 2013-07-08 NOTE — Evaluation (Signed)
Physical Therapy Evaluation Patient Details Name: [name] MRN: [id number] DOB: Apr 25, 1912 Today's Date: 07/08/2013 Time: 5701-7793 PT Time Calculation (min): 19 min  PT Assessment / Plan / Recommendation History of Present Illness  79 yo male h/o diastolic chf, gib, cafib, silent aspiration, preserved systolic function EF over 60% per echo last month, HTN, PUD comes in from short term rehab with one week of worsening sob especially with exertion.   Clinical Impression  Pt is at supervision level for mobility, according to pt's personal care aide pt is at baseline level of functioning.  No further acute PT needs identified at this time.    PT Assessment  Patent does not need any further PT services    Follow Up Recommendations  No PT follow up    Does the patient have the potential to tolerate intense rehabilitation      Barriers to Discharge        Equipment Recommendations  None recommended by PT    Recommendations for Other Services     Frequency      Precautions / Restrictions Precautions Precautions: Fall Precaution Comments: pt has full time attendant Restrictions Weight Bearing Restrictions: No   Pertinent Vitals/Pain No c/o pain, spO2 92% on 3L O2 during treatment      Mobility  Bed Mobility Overal bed mobility: Needs Assistance Bed Mobility: Supine to Sit;Sit to Supine Supine to sit: Supervision Sit to supine: Supervision General bed mobility comments: increased time Transfers Overall transfer level: Needs assistance Equipment used: Rolling walker (2 wheeled) Transfers: Sit to/from Stand Sit to Stand: Supervision General transfer comment: cues for UE placement and cues to get "nose over toes" Ambulation/Gait Ambulation/Gait assistance: Supervision Ambulation Distance (Feet): 150 Feet Assistive device: Rolling walker (2 wheeled) General Gait Details: pt with good gait velocity, 1 standing rest break required, pt able to take deep breaths  without needing cues.  pt's caregiver states pt is at baseline level of physical functioning    Exercises     PT Diagnosis:    PT Problem List:   PT Treatment Interventions:       PT Goals(Current goals can be found in the care plan section) Acute Rehab PT Goals PT Goal Formulation: No goals set, d/c therapy  Visit Information  Last PT Received On: 07/08/13 Assistance Needed: +1 History of Present Illness: 80 yo male h/o diastolic chf, gib, cafib, silent aspiration, preserved systolic function EF over 60% per echo last month, HTN, PUD comes in from short term rehab with one week of worsening sob especially with exertion.        Prior Functioning  Home Living Family/patient expects to be discharged to:: Assisted living Home Equipment: Dan Humphreys - 2 wheels Additional Comments: pt lives at [facility] Prior Function Level of Independence: Needs assistance Comments: pt has personal care attendant that provides supervision for mobility and assists with ADLs Communication Communication: [facility]    Cognition  Cognition Arousal/Alertness: Awake/alert Behavior During Therapy: WFL for tasks assessed/performed Overall Cognitive Status: Within Functional Limits for tasks assessed    Extremity/Trunk Assessment Upper Extremity Assessment Upper Extremity Assessment: Generalized weakness Lower Extremity Assessment Lower Extremity Assessment: Generalized weakness Cervical / Trunk Assessment Cervical / Trunk Assessment: Kyphotic   Balance    End of Session PT - End of Session Equipment Utilized During Treatment: Oxygen Activity Tolerance: Patient tolerated treatment well Patient left: in bed;with nursing/sitter in room;with call bell/phone within reach Nurse Communication: Mobility status  GP     Angellica Maddison 07/08/2013, 10:07 AM

## 2013-07-08 NOTE — Progress Notes (Signed)
Utilization Review Completed Eldean Nanna J. Mikal Blasdell, RN, BSN, NCM [phone]  

## 2013-07-08 NOTE — Progress Notes (Signed)
ANTIBIOTIC CONSULT NOTE - INITIAL  Pharmacy Consult for Vancocin and Zosyn Indication: rule out pneumonia  Allergies  Allergen Reactions  . Metoprolol     "dementia" "lost touch with reality per Daughter"    Patient Measurements: Height: 5\' 6"  (167.6 cm) Weight: 133 lb 9.6 oz (60.6 kg) IBW/kg (Calculated) : 63.8  Vital Signs: Temp: 97.6 F (36.4 C) (02/19 0447) Temp src: Oral (02/19 0447) BP: 111/60 mmHg (02/19 0447) Pulse Rate: 71 (02/19 0447)  Labs:  Recent Labs  07/07/13 2356  WBC 5.3  HGB 12.3*  PLT 161  CREATININE 1.13   Estimated Creatinine Clearance: 30.5 ml/min (by C-G formula based on Cr of 1.13).   Microbiology: No results found for this or any previous visit (from the past 720 hour(s)).  Medical History: Past Medical History  Diagnosis Date  . Atrial fibrillation     Not on Coumadin due to recurrent, severe GIB  . H/O: GI bleed   . Inguinal hernia   . Spinal stenosis     S/p surgery   . MI, old 461976  . Diverticulitis   . BPH (benign prostatic hyperplasia)   . Anemia   . Arthritis   . Blood transfusion   . CHF (congestive heart failure)   . Hypertension   . Heart attack 1976  . Unspecified hereditary and idiopathic peripheral neuropathy 03/25/2012  . Corns and callosities 12/18/2011  . Pain in joint, ankle and foot 11/20/2011  . Chronic airway obstruction, not elsewhere classified 07/17/2011  . Other dyspnea and respiratory abnormality 07/17/2011  . Other specified cardiac dysrhythmias(427.89) 06/04/2011  . Typhoid fever 05/29/2011  . Macular degeneration (senile) of retina, unspecified 05/29/2011  . Senile cataract, unspecified 05/29/2011  . Pericarditis, acute 05/29/2011  . Acute systolic heart failure 05/29/2011  . Unspecified venous (peripheral) insufficiency 05/29/2011  . Duodenal ulcer 05/29/2011  . Osteoarthrosis, unspecified whether generalized or localized, pelvic region and thigh 05/29/2011  . Other and unspecified disc disorder of lumbar region  05/29/2011  . Spasm of muscle 05/29/2011  . Abnormality of gait 05/29/2011  . Closed fracture of pubis 05/29/2011  . Transient ischemic attack (TIA), and cerebral infarction without residual deficits(V12.54) 2013  . Coronary atherosclerosis of native coronary artery 2013  . Pneumonia, organism unspecified 2013  . Unspecified constipation 2013  . Debility, unspecified 05/24/2011  . Unspecified essential hypertension 09/23/2012  . Chronic diastolic heart failure 06/09/2013    Medications:  Prescriptions prior to admission  Medication Sig Dispense Refill  . amLODipine (NORVASC) 5 MG tablet Take 5 mg by mouth daily.      Marland Kitchen. aspirin 81 MG tablet Take 81 mg by mouth 3 (three) times a week.        . doxepin (SINEQUAN) 25 MG capsule Take 1 capsule (25 mg total) by mouth at bedtime.  90 capsule  3  . enalapril (VASOTEC) 2.5 MG tablet Take 2.5 mg by mouth daily.      . furosemide (LASIX) 40 MG tablet Take 1 tablet (40 mg total) by mouth daily.  90 tablet  1  . polyethylene glycol (MIRALAX / GLYCOLAX) packet Take 17 g by mouth daily. Mix in 6 oz beverage in morin ing to relieve constipation      . senna-docusate (SENOKOT S) 8.6-50 MG per tablet Take 2 tablets by mouth. Nightly for stool softener and laxative, as needed      . sodium chloride (MURO 128) 2 % ophthalmic solution Place 1 drop into the left eye 2 (two) times  daily.      . spironolactone (ALDACTONE) 25 MG tablet Take 25 mg by mouth daily.      . tamsulosin (FLOMAX) 0.4 MG CAPS capsule Take 0.4 mg by mouth daily after breakfast.       Scheduled:  . amLODipine  5 mg Oral Daily  . [START ON 07/09/2013] aspirin  81 mg Oral Once per day on Mon Wed Fri  . doxepin  25 mg Oral QHS  . enalapril  2.5 mg Oral Daily  . enoxaparin (LOVENOX) injection  30 mg Subcutaneous Q24H  . furosemide  40 mg Intravenous Daily  . polyethylene glycol  17 g Oral Daily  . sodium chloride  1 drop Left Eye BID  . sodium chloride  3 mL Intravenous Q12H  . sodium chloride  3 mL  Intravenous Q12H  . spironolactone  25 mg Oral Daily  . tamsulosin  0.4 mg Oral QPC breakfast    Assessment: 80yo male c/o SOB and difficulty swallowing, admitted for acute on chronic respiratory failure w/ worsening bilateral pleural effusions, to begin IV ABX.  Goal of Therapy:  Vancomycin trough level 15-20 mcg/ml  Plan:  Will begin vancomycin 750mg  IV Q24H and Zosyn 3.375g IV Q8H and monitor CBC, Cx, levels prn.  Vernard Gambles, PharmD, BCPS  07/08/2013,4:55 AM

## 2013-07-09 MED ORDER — FUROSEMIDE 40 MG PO TABS
40.0000 mg | ORAL_TABLET | Freq: Every day | ORAL | Status: DC
  Administered 2013-07-10 – 2013-07-12 (×3): 40 mg via ORAL
  Filled 2013-07-09 (×3): qty 1

## 2013-07-09 NOTE — Progress Notes (Signed)
I co-sign on Cline Crock notes and assessments

## 2013-07-09 NOTE — Progress Notes (Signed)
Speech Language Pathology Treatment: Dysphagia  Patient Details Name: [name] MRN: [id number] DOB: [birth year] Today's Date: 07/09/2013 Time: 1220-1240 SLP Time Calculation (min): 20 min  Assessment / Plan / Recommendation Clinical Impression  Pt sleeping when SLP entered the room.  [facility] caregiver present and asked that pt not be awakened.  Results and recommendations of MBS discussed with caregiver.  Precautions updated at [facility].  Caregiver asked SLP if pt could have coffee without thickener, as pt "loves [employer] coffee, and has been asking for some".  SLP reiterated pen/asp of thin liquids per MBS, and that all liquids needed to be thickened to nectar thick liquids.  However, to respect pt/family wishes, if HCPOA/pt provide consent for pt to have coffee or other thin liquids, with known risks of aspiration, family may make that known.  Until that time, all liquids should continue to be thickened to nectar consistency.  SLP to continue following pt to assess diet tolerance, as no po intake was observed at this session, and to provide education as needed. Caregiver indicated she would talk with pt's daughter about pt having coffee per his request.   HPI HPI: 80 yo male h/o diastolic chf, gib, cafib, TIAs, silent aspiration, preserved systolic function EF over 60% per echo last month, HTN, PUD comes in from short term rehab with one week of worsening sob especially with exertion.  He was hospitalized last month with chf exacerbation and pna. At that time pt was living alone, but went to short term rehab. He has been in/out rehab since. Since his d/c last month he has been treated several times for pna in addition to abx from d/c he has also had a round of avelox and then augmentin for concerns of aspiration pna. His lasix was also increased to 40 mg po daily which he has been taking. He does cough sometimes when he eats, and his liquids have been thickened recently along with mechanical soft  food. He did have a cxr done within the last week which was compared to his cxr in [place] which showed pneumonitis vs edema with worsening new bilateral pleural effusions left >right, and worse edema no focal infiltrate specifically noted. BSE completed 07/08/13, with recommendation for mech soft diet, nectar thick liquids, and 1:1 supervision.   Pertinent Vitals VSS  SLP Plan  Continue with current plan of care    Recommendations Diet recommendations: Dysphagia 3 (mechanical soft);Nectar-thick liquid Liquids provided via: Cup;No straw Medication Administration: Crushed with puree Supervision: Patient able to self feed;Full supervision/cueing for compensatory strategies Compensations: Slow rate;Small sips/bites;Multiple dry swallows after each bite/sip Postural Changes and/or Swallow Maneuvers: Seated upright 90 degrees;Upright 30-60 min after meal              Oral Care Recommendations: Oral care before and after PO Follow up Recommendations: Skilled Nursing facility Plan: Continue with current plan of care    GO    [name] B. Murvin Natal [facility], CCC-SLP [phone]  Leigh Aurora 07/09/2013, 1:07 PM

## 2013-07-09 NOTE — Progress Notes (Signed)
[facility] PROGRESS NOTE  Elza RafterWalter F Tunney ZOX:[id number] DOB: January 15, 1912 DOA: 07/07/2013 PCP: Kimber RelicGREEN, ARTHUR G, MD  Assessment/Plan: Acute on chronic respiratory failure with hypoxia and bilateral pleural effusions - Diuresing with lasix 40 mg IV - Discontinued antibiotics on 2/20 r/t improvement with diuresis and no fever or leukocytosis - On 2L oxygen at home, currently on 3L Blanket  Acute on chronic diastolic heart failure - Diuresing with lasix - Pt weight down from 60.6kg to 59.9kg on 2/20 - Cardiac enzymes neg x3 - BNP 704.7 on 2/19 - ECHO: 60-65% EF. Normal wall motion - Heart failure education   Paroxysmal atrial fibrillation - Asprin daily - Rate controlled off medication  HTN - Soft blood pressure - Continue enalapril - Discontinued spironolactone and amlodipine - Continue to monitor  Pleural effusions, bilateral - Improving respiratory status - see above  Oropharyngeal dysphagia - Diet Dys 3   Indeterminate pulmonary nodules - Pt does not want a malignancy work-up  DVT prevention: Lovenox  Code Status: DNR Family Communication: Daughter at bedside. Updated her on plan of care Disposition Plan: Continue to diurese pt.   Consultants:  None  Procedures:  Barium Swallow  ECHO  Antibiotics:  Vancomycin- D/c'd 2/20   Zosyn- D/C'd 2/20  HPI/Subjective: Pt reports improvement in breathing and able to recline more without shortness of breath.   Objective: Filed Vitals:   07/09/13 1417  BP: 108/45  Pulse: 78  Temp: 97.7 F (36.5 C)  Resp: 18    Intake/Output Summary (Last 24 hours) at 07/09/13 1646 Last data filed at 07/09/13 1415  Gross per 24 hour  Intake    600 ml  Output    575 ml  Net     25 ml   Filed Weights   07/07/13 2322 07/08/13 0447 07/09/13 0440  Weight: 63.504 kg (140 lb) 60.6 kg (133 lb 9.6 oz) 59.9 kg (132 lb 0.9 oz)    Exam:  General: Patient in no acute distress, alert and oriented  Cardiovascular:  Irregularly irregular, no murmurs  Respiratory: Clear to auscultion bilaterally, on 3L oxygen. No rhonchi  Abdomen: Soft, non-distended, non-tender.  Musculoskeletal: No cyanosis or clubbing. No edema. Healing wound to LLE   Data Reviewed: Basic Metabolic Panel:  Recent Labs Lab 07/07/13 2356 07/08/13 0555  NA 132* 132*  K 4.5 4.3  CL 94* 93*  CO2 27 25  GLUCOSE 100* 105*  BUN 29* 28*  CREATININE 1.13 1.11  CALCIUM 8.8 8.8   Liver Function Tests: No results found for this basename: AST, ALT, ALKPHOS, BILITOT, PROT, ALBUMIN,  in the last 168 hours No results found for this basename: LIPASE, AMYLASE,  in the last 168 hours No results found for this basename: AMMONIA,  in the last 168 hours CBC:  Recent Labs Lab 07/07/13 2356 07/08/13 0555  WBC 5.3 5.0  NEUTROABS 3.7  --   HGB 12.3* 12.9*  HCT 34.7* 36.7*  MCV 95.6 96.6  PLT 161 165   Cardiac Enzymes:  Recent Labs Lab 07/08/13 0555 07/08/13 1058 07/08/13 1932  TROPONINI <0.30 <0.30 <0.30   BNP (last 3 results)  Recent Labs  05/23/13 1245 07/08/13 0021  PROBNP 1995.0* 704.7*   CBG: No results found for this basename: GLUCAP,  in the last 168 hours  Recent Results (from the past 240 hour(s))  CULTURE, BLOOD (ROUTINE X 2)     Status: None   Collection Time    07/08/13  2:28 AM      Result Value  Ref Range Status   Specimen Description BLOOD RIGHT ARM   Final   Special Requests     Final   Value: BOTTLES DRAWN AEROBIC AND ANAEROBIC RED 7CC, BLUE 10CC   Culture  Setup Time     Final   Value: 07/08/2013 08:14     Performed at [facility]   Culture     Final   Value:        BLOOD CULTURE RECEIVED NO GROWTH TO DATE CULTURE WILL BE HELD FOR 5 DAYS BEFORE ISSUING A FINAL NEGATIVE REPORT     Performed at [facility]   Report Status PENDING   Incomplete  CULTURE, BLOOD (ROUTINE X 2)     Status: None   Collection Time    07/08/13  2:35 AM      Result Value Ref Range Status   Specimen  Description BLOOD RIGHT HAND   Final   Special Requests BOTTLES DRAWN AEROBIC ONLY 10 CC   Final   Culture  Setup Time     Final   Value: 07/08/2013 08:14     Performed at [facility]   Culture     Final   Value:        BLOOD CULTURE RECEIVED NO GROWTH TO DATE CULTURE WILL BE HELD FOR 5 DAYS BEFORE ISSUING A FINAL NEGATIVE REPORT     Performed at [facility]   Report Status PENDING   Incomplete  MRSA PCR SCREENING     Status: None   Collection Time    07/08/13  5:39 AM      Result Value Ref Range Status   MRSA by PCR NEGATIVE  NEGATIVE Final   Comment:            The GeneXpert MRSA Assay (FDA     approved for NASAL specimens     only), is one component of a     comprehensive MRSA colonization     surveillance program. It is not     intended to diagnose MRSA     infection nor to guide or     monitor treatment for     MRSA infections.     Studies: Dg Chest Port 1 View  07/08/2013   CLINICAL DATA:  Short of breath  EXAM: PORTABLE CHEST - 1 VIEW  COMPARISON:  CT ANGIO CHEST W/CM &/OR WO/CM dated 05/23/2013; DG CHEST 2 VIEW dated 05/23/2013  FINDINGS: Normal cardiac silhouette. There is interval increase in bilateral pleural effusions seen on prior. There is mild central venous congestion and pulmonary edema at the bases.  IMPRESSION: Increasing bilateral pleural effusions and basilar pulmonary edema   Electronically Signed   By: Genevive Bi M.D.   On: 07/08/2013 00:13   Dg Swallowing Func-speech Pathology  07/08/2013   Vivi Ferns McCoy, CCC-SLP     07/08/2013 10:56 AM Objective Swallowing Evaluation: Modified Barium Swallowing Study   Patient Details  Name: [name] MRN: [id number] Date of Birth: Nov 12, 1911  Today's Date: 07/08/2013 Time: 4098-1191 SLP Time Calculation (min): 34 min  Past Medical History:  Past Medical History  Diagnosis Date  . Atrial fibrillation     Not on Coumadin due to recurrent, severe GIB  . H/O: GI bleed   . Inguinal hernia   . Spinal  stenosis     S/p surgery   . MI, old 73  . Diverticulitis   . BPH (benign prostatic hyperplasia)   . Anemia   . Arthritis   .  Blood transfusion   . CHF (congestive heart failure)   . Hypertension   . Heart attack 1976  . Unspecified hereditary and idiopathic peripheral neuropathy  03/25/2012  . Corns and callosities 12/18/2011  . Pain in joint, ankle and foot 11/20/2011  . Chronic airway obstruction, not elsewhere classified 07/17/2011  . Other dyspnea and respiratory abnormality 07/17/2011  . Other specified cardiac dysrhythmias(427.89) 06/04/2011  . Typhoid fever 05/29/2011  . Macular degeneration (senile) of retina, unspecified 05/29/2011  . Senile cataract, unspecified 05/29/2011  . Pericarditis, acute 05/29/2011  . Acute systolic heart failure 05/29/2011  . Unspecified venous (peripheral) insufficiency 05/29/2011  . Duodenal ulcer 05/29/2011  . Osteoarthrosis, unspecified whether generalized or localized,  pelvic region and thigh 05/29/2011  . Other and unspecified disc disorder of lumbar region 05/29/2011  . Spasm of muscle 05/29/2011  . Abnormality of gait 05/29/2011  . Closed fracture of pubis 05/29/2011  . Transient ischemic attack (TIA), and cerebral infarction  without residual deficits(V12.54) 2013  . Coronary atherosclerosis of native coronary artery 2013  . Pneumonia, organism unspecified 2013  . Unspecified constipation 2013  . Debility, unspecified 05/24/2011  . Unspecified essential hypertension 09/23/2012  . Chronic diastolic heart failure 06/09/2013   Past Surgical History:  Past Surgical History  Procedure Laterality Date  . Spine surgery      Spinal stenosis surgery with Dr. Darrelyn Hillock   . Cholecystectomy    . Total hip arthroplasty  2002    Right   . Vein surgery  2007    leg  . Inguinal hernia repair  2003    RIH  . Appendectomy    . Tonsillectomy and adenoidectomy     HPI:  80 yo male h/o diastolic chf, gib, cafib, TIAs, silent  aspiration, preserved systolic function EF over 60% per echo last  month, HTN, PUD comes in from  short term rehab with one week of  worsening sob especially with exertion.  He was hospitalized last  month with chf exac and pna. At that time was living alone, but  went to short term rehab. He has been in/out rehab since. Since  his d/c last month he has been treated several times for pna in  addition to abx from d/c he has also had a round of avelox and  then augmentin for concerns of aspiration pna. His lasix was also  increased to 40 mg po daily which he has been taking. He does  cough sometimes when he eats, and his liquids have been thickened  recently along with mechanical soft food. He did have a cxr done  within the last week which was compared to his cxr in [place] which  showed pneumonitis vs edema with worsening new bilateral pleural  effusions left >right, and worse edema no focal infiltrate  specifically noted.      Assessment / Plan / Recommendation Clinical Impression  Dysphagia Diagnosis: Moderate pharyngeal phase dysphagia;Mild  oral phase dysphagia Clinical impression: Patient present with a mild oral and a  moderate sensorimotor pharyngeal dysphagia. Oral delays noted  with soft solids only however transit remains functional.  Combination of delay in swallow initiation, base of tongue  weakness, and decreased hyo-laryngeal excursion result in  decreased airway protection with thin liquids despite therapuetic  intervention for various head postures and a supraglottic  swallow.  Additionally, cues for hard throat clear unreliable in  clearance of laryngeal vestibule. Patient with improved airway  protection with solids and trials of nectar thick liquids however  presence of moderate-severe vallecular residuals, minimizing with  multiple dry swallows,  do increase risk. Educated patient  regarding above. Patient receptive to use of thickened liquids to  mitigate risks at this time. SLP will f/u at bedside for diet  tolerance and potential for advancement. Suspect some degree of  chronic dysphagia  based on current function and advanced age,  however may note some improvement with clearance of infection and  re-conditioning.      Treatment Recommendation  Therapy as outlined in treatment plan below    Diet Recommendation Dysphagia 3 (Mechanical Soft);Nectar-thick  liquid   Liquid Administration via: Cup;No straw Medication Administration: Crushed with puree Supervision: Patient able to self feed;Full supervision/cueing  for compensatory strategies Compensations: Slow rate;Small sips/bites;Multiple dry swallows  after each bite/sip Postural Changes and/or Swallow Maneuvers: Seated upright 90  degrees;Upright 30-60 min after meal    Other  Recommendations Oral Care Recommendations: Oral care BID Other Recommendations: Order thickener from pharmacy;Prohibited  food (jello, ice cream, thin soups);Remove water pitcher   Follow Up Recommendations  Skilled Nursing facility    Frequency and Duration min 2x/week  2 weeks        General HPI: 80 yo male h/o diastolic chf, gib, cafib, TIAs,  silent aspiration, preserved systolic function EF over 60% per  echo last month, HTN, PUD comes in from short term rehab with one  week of worsening sob especially with exertion.  He was  hospitalized last month with chf exac and pna. At that time was  living alone, but went to short term rehab. He has been in/out  rehab since. Since his d/c last month he has been treated several  times for pna in addition to abx from d/c he has also had a round  of avelox and then augmentin for concerns of aspiration pna. His  lasix was also increased to 40 mg po daily which he has been  taking. He does cough sometimes when he eats, and his liquids  have been thickened recently along with mechanical soft food. He  did have a cxr done within the last week which was compared to  his cxr in [place] which showed pneumonitis vs edema with worsening  new bilateral pleural effusions left >right, and worse edema no  focal infiltrate specifically noted.  Type of  Study: Modified Barium Swallowing Study Reason for Referral: Objectively evaluate swallowing function Previous Swallow Assessment: evaluated at [facility] per patient  and placed on thickened liquids, unable to specify however  suspect honey thick Diet Prior to this Study: Dysphagia 2 (chopped);Honey-thick  liquids Temperature Spikes Noted: No Respiratory Status: Nasal cannula History of Recent Intubation: No Behavior/Cognition: Alert;Cooperative;Pleasant mood Oral Cavity - Dentition: Adequate natural dentition Oral Motor / Sensory Function: Impaired motor (mild lingual  discoordination) Self-Feeding Abilities: Able to feed self Patient Positioning: Upright in chair Baseline Vocal Quality: Clear Volitional Cough: Strong Volitional Swallow: Able to elicit Anatomy: Within functional limits Pharyngeal Secretions: Not observed secondary MBS    Reason for Referral Objectively evaluate swallowing function   Oral Phase Oral Preparation/Oral Phase Oral Phase: WFL (except mild oral transit delays with soft  solids)   Pharyngeal Phase Pharyngeal Phase Pharyngeal Phase: Impaired Pharyngeal - Nectar Pharyngeal - Nectar Teaspoon: Delayed swallow  initiation;Premature spillage to valleculae;Pharyngeal residue -  valleculae;Pharyngeal residue - pyriform sinuses;Reduced tongue  base retraction;Reduced anterior laryngeal mobility Pharyngeal - Nectar Cup: Delayed swallow initiation;Premature  spillage to valleculae;Pharyngeal residue - valleculae;Pharyngeal  residue - pyriform sinuses;Reduced tongue base retraction;Reduced  anterior  laryngeal mobility Pharyngeal - Thin Pharyngeal - Thin Teaspoon: Delayed swallow initiation;Pharyngeal  residue - valleculae;Pharyngeal residue - pyriform  sinuses;Reduced tongue base retraction;Reduced anterior laryngeal  mobility;Premature spillage to pyriform sinuses;Reduced  airway/laryngeal closure;Penetration/Aspiration before  swallow;Penetration/Aspiration during swallow (chin tuck, head  turn  left/right unsuccessful) Penetration/Aspiration details (thin teaspoon): Material enters  airway, CONTACTS cords and not ejected out Pharyngeal - Thin Cup: Delayed swallow initiation;Pharyngeal  residue - valleculae;Pharyngeal residue - pyriform  sinuses;Reduced tongue base retraction;Reduced anterior laryngeal  mobility;Premature spillage to pyriform sinuses;Reduced  airway/laryngeal closure;Penetration/Aspiration during swallow  (supraglottic swallow unsuccessful) Penetration/Aspiration details (thin cup): Material enters  airway, CONTACTS cords and not ejected out Pharyngeal - Solids Pharyngeal - Puree: Delayed swallow initiation;Premature spillage  to valleculae;Pharyngeal residue - valleculae;Reduced tongue base  retraction;Reduced anterior laryngeal mobility Pharyngeal - Mechanical Soft: Delayed swallow  initiation;Premature spillage to valleculae;Pharyngeal residue -  valleculae;Reduced tongue base retraction;Reduced anterior  laryngeal mobility  Cervical Esophageal Phase    GO   Ferdinand Lango MA, CCC-SLP [phone]  Cervical Esophageal Phase Cervical Esophageal Phase: [facility]         McCoy Leah Meryl 07/08/2013, 10:56 AM     Scheduled Meds: . aspirin EC  81 mg Oral Daily  . doxepin  25 mg Oral QHS  . enalapril  2.5 mg Oral Daily  . enoxaparin (LOVENOX) injection  30 mg Subcutaneous Q24H  . furosemide  40 mg Intravenous Daily  . polyethylene glycol  17 g Oral Daily  . sodium chloride  1 drop Left Eye BID  . sodium chloride  3 mL Intravenous Q12H  . tamsulosin  0.4 mg Oral QPC breakfast   Continuous Infusions:   Principal Problem:   Acute on chronic respiratory failure with hypoxia Active Problems:   PAF (paroxysmal atrial fibrillation)   Unspecified essential hypertension   Acute on chronic diastolic heart failure   Oropharyngeal dysphagia   SOB (shortness of breath) on exertion   Aspiration, chronic pulmonary   Indeterminate pulmonary nodules   Pleural effusion, bilateral    Time  spent: > 35 min     Penny Pia  [facility] Pager [phone]. If 7PM-7AM, please contact night-coverage at [email or web address], password [facility] 07/09/2013, 4:46 PM  LOS: 2 days

## 2013-07-09 NOTE — Progress Notes (Signed)
Pt stable, VSS.  Pt educated on coughing and deep breathing.

## 2013-07-10 LAB — BASIC METABOLIC PANEL
BUN: 31 mg/dL — AB (ref 6–23)
CHLORIDE: 92 meq/L — AB (ref 96–112)
CO2: 27 mEq/L (ref 19–32)
Calcium: 9 mg/dL (ref 8.4–10.5)
Creatinine, Ser: 1.14 mg/dL (ref 0.50–1.35)
GFR, EST AFRICAN AMERICAN: 59 mL/min — AB (ref >90)
GFR, EST NON AFRICAN AMERICAN: 51 mL/min — AB (ref >90)
Glucose, Bld: 82 mg/dL (ref 70–99)
Potassium: 4.5 mEq/L (ref 3.7–5.3)
Sodium: 132 mEq/L — ABNORMAL LOW (ref 137–147)

## 2013-07-10 LAB — CBC
HCT: 36.9 % — ABNORMAL LOW (ref 39.0–52.0)
Hemoglobin: 13.1 g/dL (ref 13.0–17.0)
MCH: 33.9 pg (ref 26.0–34.0)
MCHC: 35.5 g/dL (ref 30.0–36.0)
MCV: 95.6 fL (ref 78.0–100.0)
Platelets: 165 10*3/uL (ref 150–400)
RBC: 3.86 MIL/uL — ABNORMAL LOW (ref 4.22–5.81)
RDW: 14.6 % (ref 11.5–15.5)
WBC: 5.2 10*3/uL (ref 4.0–10.5)

## 2013-07-10 MED ORDER — ENSURE PUDDING PO PUDG
1.0000 | Freq: Two times a day (BID) | ORAL | Status: DC
  Administered 2013-07-10 – 2013-07-11 (×2): 1 via ORAL

## 2013-07-10 NOTE — Evaluation (Signed)
Physical Therapy Evaluation/ Discharge Patient Details Name: [name] MRN: [id number] DOB: 04/10/1912 Today's Date: 07/10/2013 Time: 5643-3295 PT Time Calculation (min): 23 min  PT Assessment / Plan / Recommendation History of Present Illness  80 yo male h/o diastolic chf, gib, cafib, silent aspiration, preserved systolic function EF over 60% per echo last month, HTN, PUD comes in from short term rehab with one week of worsening sob especially with exertion.   Clinical Impression  Pt with eval and DC 2 days ago. On arrival asked pt and caregiver if pt required additional eval or was still at baseline and they stated yes because pt had not walked since prior eval. Pt remains at supervision level and continues to plan for additional rehab at [facility] before return to his apt where he typically walks 1/80mi 2x/day. Pt walking well in hallway and encouraged to continue to do so with nursing and pt agreeable to defer all further needs for increased activity and strengthening to [facility] re[facility]. Will sign off.     PT Assessment  All further PT needs can be met in the next venue of care    Follow Up Recommendations  SNF (per pt prior arrangement to finish rehab )    Does the patient have the potential to tolerate intense rehabilitation      Barriers to Discharge        Equipment Recommendations  None recommended by PT    Recommendations for Other Services     Frequency      Precautions / Restrictions Precautions Precautions: Fall   Pertinent Vitals/Pain No pain 3L throughout      Mobility  Bed Mobility Overal bed mobility: Needs Assistance Bed Mobility: Supine to Sit Supine to sit: Supervision General bed mobility comments: use of rail to transfer supine to sit Transfers Equipment used: Rolling walker (2 wheeled) Transfers: Sit to/from Stand Sit to Stand: Supervision General transfer comment: cues to reach for surface with sitting Ambulation/Gait Ambulation/Gait  assistance: Supervision Ambulation Distance (Feet): 500 Feet Assistive device: Rolling walker (2 wheeled) Gait Pattern/deviations: Step-through pattern;Trunk flexed Gait velocity interpretation: at or above normal speed for age/gender General Gait Details: 1 standing rest during gait and cues throughout to step into RW    Exercises     PT Diagnosis: Difficulty walking  PT Problem List: Decreased strength;Decreased activity tolerance;Decreased mobility;Decreased knowledge of use of DME PT Treatment Interventions:       PT Goals(Current goals can be found in the care plan section) Acute Rehab PT Goals PT Goal Formulation: No goals set, d/c therapy  Visit Information  Last PT Received On: 07/10/13 Assistance Needed: +1 History of Present Illness: 80 yo male h/o diastolic chf, gib, cafib, silent aspiration, preserved systolic function EF over 60% per echo last month, HTN, PUD comes in from short term rehab with one week of worsening sob especially with exertion.        Prior [place] expects to be discharged to:: [place]: [place] Rile - 2 wheels Additional Comments: pt lives at [facility] in [place] and currently in SNF for rehab  Prior Function Level of Independence: Needs assistance Comments: pt has personal care attendant that provides supervision for mobility and assists with ADLs Communication Communication: HOH Dominant Hand: Right    Cognition  Cognition Arousal/Alertness: Awake/alert Behavior During Therapy: WFL for tasks assessed/performed Overall Cognitive Status: Within Functional Limits for tasks assessed    Extremity/Trunk Assessment Upper Extremity Assessment Upper Extremity Assessment: Generalized weakness Lower  Extremity Assessment Lower Extremity Assessment: Generalized weakness Cervical / Trunk Assessment Cervical / Trunk Assessment: Kyphotic   Balance    End of Session PT - End of Session Equipment  Utilized During Treatment: Oxygen;Gait belt Activity Tolerance: Patient tolerated treatment well Patient left: in chair;with call bell/phone within reach;with family/visitor present Nurse Communication: Mobility status  GP     Melford Aase 07/10/2013, 10:31 AM Elwyn Reach, [place]

## 2013-07-10 NOTE — Progress Notes (Signed)
Pt up in [facility]. Paged MD to add ensure pudding d/t Pt decrease intake r/t difficulty swallowing . Will continue to monitor

## 2013-07-10 NOTE — Progress Notes (Addendum)
Pt O3x, no complaints of pain or SOB . Pt placed on 3l o2 added humidification. Fall risk band place and DNR band placed. Pt O2 stat on 2L 90-92% at rest, place back on 3l. Will continue to monitor

## 2013-07-10 NOTE — Progress Notes (Signed)
[facility] PROGRESS NOTE  Geoffrey [name] F [name]:[id number] DOB: Oct 13, 1911 DOA: 07/07/2013 PCP: Kimber RelicGREEN, Geoffrey G, MD  Assessment/Plan: Acute on chronic respiratory failure with hypoxia and bilateral pleural effusions - Changed to lasix 40 mg po 07/10/13 - Discontinued antibiotics on 2/20 r/t improvement with diuresis and no fever or leukocytosis - Will try to wean patient to room air as tolerated  Acute on chronic diastolic heart failure - Diuresing with lasix - Pt weight down from 63.5 kg to 60.2 kg on 2/21 - Cardiac enzymes neg x3 - BNP 704.7 on 2/19 - ECHO: 60-65% EF. Normal wall motion - Heart failure education - On Ace inhibitor, reports that he dose not tolerate Metroprolol and family hesitant to try B blockers at this time   Paroxysmal atrial fibrillation - Asprin daily - Rate controlled off medication  HTN - Soft blood pressure - Continue enalapril - Discontinued spironolactone and amlodipine - Continue to monitor  Pleural effusions, bilateral - Improving respiratory status - see above  Oropharyngeal dysphagia - Diet Dys 3   Indeterminate pulmonary nodules - Pt does not want a malignancy work-up  DVT prevention: Lovenox  Code Status: DNR Family Communication: Daughter at bedside. Updated her on plan of care Disposition Plan: Continue to diurese pt. With improvement in respiratory condition.   Consultants:  None  Procedures:  Barium Swallow  ECHO  Antibiotics:  Vancomycin- D/c'd 2/20   Zosyn- D/C'd 2/20  HPI/Subjective: Pt reports improvement in breathing and able to recline more without shortness of breath.   Objective: Filed Vitals:   07/10/13 1437  BP: 99/49  Pulse: 74  Temp:   Resp: 20    Intake/Output Summary (Last 24 hours) at 07/10/13 1503 Last data filed at 07/10/13 1330  Gross per 24 hour  Intake    200 ml  Output    876 ml  Net   -676 ml   Filed Weights   07/08/13 0447 07/09/13 0440 07/10/13 0409  Weight: 60.6 kg  (133 lb 9.6 oz) 59.9 kg (132 lb 0.9 oz) 60.238 kg (132 lb 12.8 oz)    Exam:  General: Patient in no acute distress, alert and oriented  Cardiovascular: Irregularly irregular, no murmurs  Respiratory: Clear to auscultion bilaterally, No wheezes No rhonchi  Abdomen: Soft, non-distended, non-tender.  Musculoskeletal: No cyanosis or clubbing. No edema. Healing wound to LLE   Data Reviewed: Basic Metabolic Panel:  Recent Labs Lab 07/07/13 2356 07/08/13 0555 07/10/13 0533  NA 132* 132* 132*  K 4.5 4.3 4.5  CL 94* 93* 92*  CO2 27 25 27   GLUCOSE 100* 105* 82  BUN 29* 28* 31*  CREATININE 1.13 1.11 1.14  CALCIUM 8.8 8.8 9.0   Liver Function Tests: No results found for this basename: AST, ALT, ALKPHOS, BILITOT, PROT, ALBUMIN,  in the last 168 hours No results found for this basename: LIPASE, AMYLASE,  in the last 168 hours No results found for this basename: AMMONIA,  in the last 168 hours CBC:  Recent Labs Lab 07/07/13 2356 07/08/13 0555 07/10/13 0533  WBC 5.3 5.0 5.2  NEUTROABS 3.7  --   --   HGB 12.3* 12.9* 13.1  HCT 34.7* 36.7* 36.9*  MCV 95.6 96.6 95.6  PLT 161 165 165   Cardiac Enzymes:  Recent Labs Lab 07/08/13 0555 07/08/13 1058 07/08/13 1932  TROPONINI <0.30 <0.30 <0.30   BNP (last 3 results)  Recent Labs  05/23/13 1245 07/08/13 0021  PROBNP 1995.0* 704.7*   CBG: No results found for this  basename: GLUCAP,  in the last 168 hours  Recent Results (from the past 240 hour(s))  CULTURE, BLOOD (ROUTINE X 2)     Status: None   Collection Time    07/08/13  2:28 AM      Result Value Ref Range Status   Specimen Description BLOOD RIGHT ARM   Final   Special Requests     Final   Value: BOTTLES DRAWN AEROBIC AND ANAEROBIC RED 7CC, BLUE 10CC   Culture  Setup Time     Final   Value: 07/08/2013 08:14     Performed at [facility]   Culture     Final   Value:        BLOOD CULTURE RECEIVED NO GROWTH TO DATE CULTURE WILL BE HELD FOR 5 DAYS BEFORE  ISSUING A FINAL NEGATIVE REPORT     Performed at [facility]   Report Status PENDING   Incomplete  CULTURE, BLOOD (ROUTINE X 2)     Status: None   Collection Time    07/08/13  2:35 AM      Result Value Ref Range Status   Specimen Description BLOOD RIGHT HAND   Final   Special Requests BOTTLES DRAWN AEROBIC ONLY 10 CC   Final   Culture  Setup Time     Final   Value: 07/08/2013 08:14     Performed at [facility]   Culture     Final   Value:        BLOOD CULTURE RECEIVED NO GROWTH TO DATE CULTURE WILL BE HELD FOR 5 DAYS BEFORE ISSUING A FINAL NEGATIVE REPORT     Performed at [facility]   Report Status PENDING   Incomplete  MRSA PCR SCREENING     Status: None   Collection Time    07/08/13  5:39 AM      Result Value Ref Range Status   MRSA by PCR NEGATIVE  NEGATIVE Final   Comment:            The GeneXpert MRSA Assay (FDA     approved for NASAL specimens     only), is one component of a     comprehensive MRSA colonization     surveillance program. It is not     intended to diagnose MRSA     infection nor to guide or     monitor treatment for     MRSA infections.     Studies: No results found.  Scheduled Meds: . aspirin EC  81 mg Oral Daily  . doxepin  25 mg Oral QHS  . enalapril  2.5 mg Oral Daily  . enoxaparin (LOVENOX) injection  30 mg Subcutaneous Q24H  . feeding supplement (ENSURE)  1 Container Oral BID BM  . furosemide  40 mg Oral Daily  . polyethylene glycol  17 g Oral Daily  . sodium chloride  1 drop Left Eye BID  . sodium chloride  3 mL Intravenous Q12H  . tamsulosin  0.4 mg Oral QPC breakfast   Continuous Infusions:   Principal Problem:   Acute on chronic respiratory failure with hypoxia Active Problems:   PAF (paroxysmal atrial fibrillation)   Unspecified essential hypertension   Acute on chronic diastolic heart failure   Oropharyngeal dysphagia   SOB (shortness of breath) on exertion   Aspiration, chronic pulmonary    Indeterminate pulmonary nodules   Pleural effusion, bilateral    Time spent: > 35 min     Geoffrey [name],  [facility]  [facility] Pager [phone]. If 7PM-7AM, please contact night-coverage at [email or web address], password [facility] 07/10/2013, 3:03 PM  LOS: 3 days

## 2013-07-11 NOTE — Progress Notes (Signed)
Noted that patient is no longer in A-fib and appeared to be in sinus rhythm.  Per Dr. Yevonne Pax note patient has paroxysmal A-fib.  EKG obtained and in chart.  Dr. Cena Benton notified.  Will continue to monitor.

## 2013-07-11 NOTE — Progress Notes (Signed)
[facility] PROGRESS NOTE  [name] [id number] DOB: 23-Mar-1912 DOA: 07/07/2013 PCP: Kimber Relic, MD  Assessment/Plan: Acute on chronic respiratory failure with hypoxia and bilateral pleural effusions - Continue lasix PO (home dose). Changed to po on 07/10/13 - Discontinued antibiotics on 2/20 r/t improvement with diuresis and no fever or leukocytosis - Will try to wean patient to room air as tolerated. Currently 98% on 3L , but de-sats on 2L. Pt on home O2.  Acute on chronic diastolic heart failure - Diuresed with IV lasix initially - Pt weight down - Cardiac enzymes neg x3 - BNP 704.7 on 2/19 - ECHO: 60-65% EF. Normal wall motion - Heart failure education - On Ace inhibitor, reports that he dose not tolerate Metroprolol and family hesitant to try B blockers at this time   Paroxysmal atrial fibrillation - Asprin daily - Rate controlled off medication  HTN - Soft blood pressure - Continue enalapril - Discontinued spironolactone and amlodipine on 2/20 - Continue to monitor  Pleural effusions, bilateral - Improving respiratory status - see above  Oropharyngeal dysphagia - Diet Dys 3   Indeterminate pulmonary nodules - Pt does not want a malignancy work-up  DVT prevention: Lovenox  Code Status: DNR Family Communication: Care giver at bedside. Updated her on plan of care Disposition Plan:  With continued improvement in respiratory condition, pt may be able to be discharged tomorrow.   Consultants:  None  Procedures:  Barium Swallow  ECHO  Antibiotics:  Vancomycin- D/c'd 2/20   Zosyn- D/C'd 2/20  HPI/Subjective: Pt reports improvement in breathing and able to move/talk in complete sentences without shortness of breath.   Objective: Filed Vitals:   07/11/13 0930  BP: 105/57  Pulse: 90  Temp:   Resp:     Intake/Output Summary (Last 24 hours) at 07/11/13 1031 Last data filed at 07/11/13 0931  Gross per 24 hour  Intake    123 ml   Output    425 ml  Net   -302 ml   Filed Weights   07/10/13 0409 07/11/13 0140 07/11/13 0627  Weight: 60.238 kg (132 lb 12.8 oz) 67.405 kg (148 lb 9.6 oz) 61.3 kg (135 lb 2.3 oz)    Exam:  General: Patient in no acute distress, alert and oriented  Cardiovascular: Irregularly irregular, no murmurs  Respiratory: Clear to auscultion bilaterally, No wheezes No rhonchi  Abdomen: Soft, non-distended, non-tender.  Musculoskeletal: No cyanosis or clubbing. No edema. Healing wound to LLE   Data Reviewed: Basic Metabolic Panel:  Recent Labs Lab 07/07/13 2356 07/08/13 0555 07/10/13 0533  NA 132* 132* 132*  K 4.5 4.3 4.5  CL 94* 93* 92*  CO2 27 25 27   GLUCOSE 100* 105* 82  BUN 29* 28* 31*  CREATININE 1.13 1.11 1.14  CALCIUM 8.8 8.8 9.0   Liver Function Tests: No results found for this basename: AST, ALT, ALKPHOS, BILITOT, PROT, ALBUMIN,  in the last 168 hours No results found for this basename: LIPASE, AMYLASE,  in the last 168 hours No results found for this basename: AMMONIA,  in the last 168 hours CBC:  Recent Labs Lab 07/07/13 2356 07/08/13 0555 07/10/13 0533  WBC 5.3 5.0 5.2  NEUTROABS 3.7  --   --   HGB 12.3* 12.9* 13.1  HCT 34.7* 36.7* 36.9*  MCV 95.6 96.6 95.6  PLT 161 165 165   Cardiac Enzymes:  Recent Labs Lab 07/08/13 0555 07/08/13 1058 07/08/13 1932  TROPONINI <0.30 <0.30 <0.30   BNP (last 3  results)  Recent Labs  05/23/13 1245 07/08/13 0021  PROBNP 1995.0* 704.7*   CBG: No results found for this basename: GLUCAP,  in the last 168 hours  Recent Results (from the past 240 hour(s))  CULTURE, BLOOD (ROUTINE X 2)     Status: None   Collection Time    07/08/13  2:28 AM      Result Value Ref Range Status   Specimen Description BLOOD RIGHT ARM   Final   Special Requests     Final   Value: BOTTLES DRAWN AEROBIC AND ANAEROBIC RED 7CC, BLUE 10CC   Culture  Setup Time     Final   Value: 07/08/2013 08:14     Performed at [facility]    Culture     Final   Value:        BLOOD CULTURE RECEIVED NO GROWTH TO DATE CULTURE WILL BE HELD FOR 5 DAYS BEFORE ISSUING A FINAL NEGATIVE REPORT     Performed at [facility]   Report Status PENDING   Incomplete  CULTURE, BLOOD (ROUTINE X 2)     Status: None   Collection Time    07/08/13  2:35 AM      Result Value Ref Range Status   Specimen Description BLOOD RIGHT HAND   Final   Special Requests BOTTLES DRAWN AEROBIC ONLY 10 CC   Final   Culture  Setup Time     Final   Value: 07/08/2013 08:14     Performed at [facility]   Culture     Final   Value:        BLOOD CULTURE RECEIVED NO GROWTH TO DATE CULTURE WILL BE HELD FOR 5 DAYS BEFORE ISSUING A FINAL NEGATIVE REPORT     Performed at [facility]   Report Status PENDING   Incomplete  MRSA PCR SCREENING     Status: None   Collection Time    07/08/13  5:39 AM      Result Value Ref Range Status   MRSA by PCR NEGATIVE  NEGATIVE Final   Comment:            The GeneXpert MRSA Assay (FDA     approved for NASAL specimens     only), is one component of a     comprehensive MRSA colonization     surveillance program. It is not     intended to diagnose MRSA     infection nor to guide or     monitor treatment for     MRSA infections.     Studies: No results found.  Scheduled Meds: . aspirin EC  81 mg Oral Daily  . doxepin  25 mg Oral QHS  . enalapril  2.5 mg Oral Daily  . enoxaparin (LOVENOX) injection  30 mg Subcutaneous Q24H  . feeding supplement (ENSURE)  1 Container Oral BID BM  . furosemide  40 mg Oral Daily  . polyethylene glycol  17 g Oral Daily  . sodium chloride  1 drop Left Eye BID  . sodium chloride  3 mL Intravenous Q12H  . tamsulosin  0.4 mg Oral QPC breakfast   Continuous Infusions:   Principal Problem:   Acute on chronic respiratory failure with hypoxia Active Problems:   PAF (paroxysmal atrial fibrillation)   Unspecified essential hypertension   Acute on chronic diastolic heart  failure   Oropharyngeal dysphagia   SOB (shortness of breath) on exertion   Aspiration, chronic pulmonary  Indeterminate pulmonary nodules   Pleural effusion, bilateral    Time spent: > 30 min     Parke Simmers  [facility] Pager [phone]. If 7PM-7AM, please contact night-coverage at [email or web address], password [facility] 07/11/2013, 10:31 AM  LOS: 4 days

## 2013-07-11 NOTE — Clinical Social Work Psychosocial (Signed)
     Clinical Social Work Department BRIEF PSYCHOSOCIAL ASSESSMENT 07/11/2013  Patient:  [name], [name]     Account Number:  [id number]     Admit date:  07/07/2013  Clinical Social Worker:  Tiburcio Pea  Date/Time:  07/09/2013 01:25 PM  Referred by:  Physician  Date Referred:  07/08/2013 Referred for  Other - See comment   Other Referral:   Return to SNF   Interview type:  Other - See comment Other interview type:    PSYCHOSOCIAL DATA Living Status:  FACILITY Admitted from facility:  [facility] Level of care:  Skilled Nursing Facility Primary support name:  [name]   [phone] Primary support relationship to patient:  CHILD, ADULT Degree of support available:   Strong support    * Son: [name]- [phone]  Lives in [place]    CURRENT CONCERNS Current Concerns  Other - See comment   Other Concerns:   Return to SNF    SOCIAL WORK ASSESSMENT / PLAN 80 year old male- resident of [facility]. Patient is currently residing in their SNF unit and plans to return there for continued rehab and strengthening.  CSW spoke to patient and to his daughter [name] and this is what they both want for his plan of care. CSW also spoke to [place]- at [facility]. She confirmed above and stated that she would have a bed for patient when medicallys table.  Fl will be placed on chart for MD's signature.   Assessment/plan status:  Psychosocial Support/Ongoing Assessment of Needs Other assessment/ plan:   Information/referral to community resources:   None at this tme    PATIENTS/FAMILYS RESPONSE TO PLAN OF CARE: Very pleasant gentleman who is alert and oriented- stated that he normally is in independent living at [facility] but he had moved to their rehab unit for some strengthening. He states that he is very pleased with the care at the facilitly and plans to return there as soon as he is released from the hospital. Patient states that  he is feeling much better and denies any current needs or concerns.  CSW also spoke to patient's daughter [name] who verbalized the same sentiments as above and related taht she wants pateint to return to [facility] SNF when medically stable.  CSW will assist patient and family as needed with this.

## 2013-07-11 NOTE — Progress Notes (Signed)
Patient alert and oriented x4 throughout shift.  Family at bedside this evening.  Ambulated 434ft in hall with patient using gait belt, front wheel walker, and rolling oxygen tank.  Patient denied any shortness of breath, but did take two standing rests.  Patient and family deny any questions or concerns at this time.  Will continue to monitor.

## 2013-07-12 ENCOUNTER — Ambulatory Visit: Payer: Medicare Other | Admitting: Cardiology

## 2013-07-12 ENCOUNTER — Encounter: Payer: Self-pay | Admitting: Geriatric Medicine

## 2013-07-12 ENCOUNTER — Other Ambulatory Visit: Payer: Medicare Other

## 2013-07-12 ENCOUNTER — Non-Acute Institutional Stay (SKILLED_NURSING_FACILITY): Payer: Medicare Other | Admitting: Geriatric Medicine

## 2013-07-12 DIAGNOSIS — R1312 Dysphagia, oropharyngeal phase: Secondary | ICD-10-CM

## 2013-07-12 DIAGNOSIS — R918 Other nonspecific abnormal finding of lung field: Secondary | ICD-10-CM

## 2013-07-12 DIAGNOSIS — I5033 Acute on chronic diastolic (congestive) heart failure: Secondary | ICD-10-CM

## 2013-07-12 DIAGNOSIS — J90 Pleural effusion, not elsewhere classified: Secondary | ICD-10-CM

## 2013-07-12 DIAGNOSIS — R0602 Shortness of breath: Secondary | ICD-10-CM

## 2013-07-12 NOTE — Assessment & Plan Note (Signed)
Etiology unclear; heart failure vs. post infection vs. malignancy. Will consider thoracentesis if breathing does not improve.

## 2013-07-12 NOTE — Assessment & Plan Note (Signed)
Pulmonologist pulmonary nodules noted on chest imaging from prior hospitalization. Patient does not wish to proceed with workup at this time. If respiratory status does not improve will revisit workup with thoracentesis

## 2013-07-12 NOTE — Progress Notes (Signed)
Patient ID: [name], male   DOB: 1911-07-15, 80 y.o.   MRN: [id number]   [facility] SNF [phone])  Code Status: DNR Contact Information   Name Relation Home Work Tok Son [phone]  [phone]   [name] Daughter [phone]  [phone]   [name] [phone]  [phone]   Basit, Teakell Significant other [phone]     Albert, Tainter [phone]  [phone]       Chief Complaint  Patient presents with  . Hospitalization Follow-up    Heart failure, hypoxia    HPI: This is a 80 y.o. male resident of [facility],  Independent Living section. He was discharged from the rehabilitation section on February 13 after a stay for pneumonia and exacerbation of heart failure. He was readmitted to the rehabilitation section on February 16 due to shortness of breath and hypoxia. Hypoxia improved with supplemental oxygen. Chest x-ray showed worsening pleural effusion on the left, patchy pneumonitis or edema bilateral lower lobes worse when compared to prior film. He was evaluated by Dr. Chilton Si. Additional diuretic was prescribed (spironolactone). Modified barium swallow study was planned for when patient was stronger. Patient's condition changed evening of February 18, she was significantly short of breath despite supplemental oxygen therapy, he was having difficulty swallowing, heart rate was lower than usual; patient was in acute distress. Nurse obtained order from on-call provider for transfer to emergency department. ED evaluation included chest x-ray which confirmed worsening bilateral pleural effusions. There was no sign of pulmonary infection. Patient was treated with IV diuretics and improved. There was discussion with both patient and his daughter regarding evaluation of pulmonary nodules that were evident on an earlier scan of the chest. Workup of these pulmonary nodules not desired at this  time Modified barium swallow study was performed February 19 showing moderate pharyngeal phase dysphagia and mild or oral phase dysphagia. Recommended diet was mechanical soft with nectar thick liquids. The patient was medically ready for discharge to skilled rehabilitation section today February 23. He remains oxygen dependent with general weakness, though reports feeling quite well   Allergies  Allergen Reactions  . Metoprolol     "dementia" "lost touch with reality per Daughter"    MEDICATIONS -     Medication List       This list is accurate as of: 07/12/13  5:57 PM.  Always use your most recent med list.               aspirin 81 MG tablet  Take 81 mg by mouth 3 (three) times a week.     doxepin 25 MG capsule  Commonly known as:  SINEQUAN  Take 1 capsule (25 mg total) by mouth at bedtime.     enalapril 2.5 MG tablet  Commonly known as:  VASOTEC  Take 2.5 mg by mouth daily.     furosemide 40 MG tablet  Commonly known as:  LASIX  Take 1 tablet (40 mg total) by mouth daily.     polyethylene glycol packet  Commonly known as:  MIRALAX / GLYCOLAX  Take 17 g by mouth daily. Mix in 6 oz beverage in morin ing to relieve constipation     SENOKOT S 8.6-50 MG per tablet  Generic drug:  senna-docusate  Take 2 tablets by mouth. Nightly for stool softener and laxative, as needed     sodium chloride 2 % ophthalmic solution  Commonly known as:  MURO 128  Place 1 drop into the left eye 2 (  two) times daily.     tamsulosin 0.4 MG Caps capsule  Commonly known as:  FLOMAX  Take 0.4 mg by mouth daily after breakfast.         DATA REVIEWED  Radiologic Exams 07/07/2013 PORTABLE CHEST - 1 VIEW   COMPARISON:  CT ANGIO CHEST W/CM &/OR WO/CM dated 05/23/2013; DG CHEST 2 VIEW dated 05/23/2013   FINDINGS: Normal cardiac silhouette. There is interval increase in bilateral pleural effusions seen on prior. There is mild central venous congestion and pulmonary edema at the bases.    IMPRESSION: Increasing bilateral pleural effusions and basilar pulmonary edema   Cardiovascular Exams:   Laboratory Studies: Lab Results  Component Value Date   WBC 5.2 07/10/2013   HGB 13.1 07/10/2013   HCT 36.9* 07/10/2013   MCV 95.6 07/10/2013   PLT 165 07/10/2013   Lab Results  Component Value Date   NA 132* 07/10/2013   K 4.5 07/10/2013   GLU 83 06/22/2013   BUN 31* 07/10/2013   CREATININE 1.14 07/10/2013   Lab Results  Component Value Date   CALCIUM 9.0 07/10/2013   ALBUMIN 2.9* 05/15/2011   AST 112* 05/15/2011   ALT 77* 05/15/2011   ALKPHOS 72 05/15/2011   BILITOT 1.8* 05/15/2011   GFRNONAA 51* 07/10/2013   GFRAA 59* 07/10/2013   No results found for this basename: vitaminb12, vitamind   .  REVIEW OF SYSTEMS  DATA OBTAINED: from patient, medical record, , family member(son, [name]) GENERAL: Feels well    SKIN: No itch, rash   EYES: No eye pain, dryness or itching  No change in vision EARS: No earache, change in hearing NOSE: No congestion, drainage or bleeding MOUTH/THROAT: No mouth or tooth pain  No sore throat   No difficulty chewing or swallowing RESPIRATORY: No cough, wheezing. SOB present CARDIAC: No chest pain, palpitations  No edema. GI: No abdominal pain  No nausea, vomiting,diarrhea or constipation  No heartburn or reflux  GU: No dysuria, frequency or urgency  No change in urine volume or character MUSCULOSKELETAL: No joint pain, swelling or stiffness  No back pain. Generalized muscle weakness No muscle ache, pain,  NEUROLOGIC: No dizziness, fainting, headache  No change in mental status.  PSYCHIATRIC: No feelings of anxiety, depression  Sleeps well.  No behavior issue.    PHYSICAL EXAM Filed Vitals:   07/12/13 1708  BP: 132/72  Pulse: 85  Temp: 98.2 F (36.8 C)  Resp: 19  SpO2: 95%   There is no weight on file to calculate BMI.  GENERAL APPEARANCE: No acute distress, appropriately groomed, Frail body habitus. Alert, pleasant, conversant. SKIN: No  diaphoresis, rash, wound left lower leg continues to heal HEAD: Normocephalic, atraumatic EYES: Conjunctiva/lids clear. Pupils round, reactive.  EARS: External exam WNL , poor hearing  NOSE: No deformity or discharge. MOUTH/THROAT: Lips w/o lesions. Oral mucosa, tongue DRY w/o lesion. Oropharynx w/o redness or lesions.  NECK: Supple, full ROM. No thyroid tenderness, enlargement or nodule LYMPHATICS: No head, neck or supraclavicular adenopathy RESPIRATORY: Breathing is even, unlabored. Lung sounds are significantly decreased at bases bilaterally  CARDIOVASCULAR: Heart RRR. No murmur or extra heart sounds  ARTERIAL: No carotid, bruit.   EDEMA: No peripheral edema. No ascites GASTROINTESTINAL: Abdomen is soft, non-tender, not distended w/ normal bowel sounds. No hepatic or splenic enlargement. MUSCULOSKELETAL: Moves all extremities with full ROM, strength and tone. Back is without kyphosis, scoliosis or spinal process tenderness.  NEUROLOGIC: Oriented to time, place, person. Cranial nerves 2-12 grossly intact, speech  clear, no tremor.  PSYCHIATRIC: Mood and affect appropriate to situation   ASSESSMENT/PLAN  Acute on chronic diastolic heart failure Appears well compensated today, no sign of volume overload. Continue Lasix 40 mg daily, daily weights as well.  Pleural effusion, bilateral Etiology unclear; heart failure vs. post infection vs. malignancy. Will consider thoracentesis if breathing does not improve.  Oropharyngeal dysphagia Type aerosol study during hospitalization confirms mild to moderate dysphagia. Resume ST interventions, monitor fluid intake with thickened fluids. Hopefully his diet can be advanced to the most palatable he desires.   Indeterminate pulmonary nodules Pulmonologist pulmonary nodules noted on chest imaging from prior hospitalization. Patient does not wish to proceed with workup at this time. If respiratory status does not improve will revisit workup with  thoracentesis  SOB (shortness of breath) Patient has been oxygen dependent since hospitalization on February 16th. O2 saturation 92% this afternoon with on 2 L Continue to provide continuous supplemental oxygen, attempt to wean as he improves    Family/ staff Communication:  Son Merlyn AlbertFred presents for exam and interview today. He agrees with current plan   Labs/tests ordered: 07/15/2012, BMP, BNP. Recheck repeat chest x-ray   Follow up: Return for Routine/as needed.  Toribio Harbourlaudette T.Tanique Matney, NP-C [name] [phone]  07/12/2013

## 2013-07-12 NOTE — Progress Notes (Signed)
Patient discharged to [facility], transported by [facility] staff.  Patient hooked up to portable oxygen tank at 2L via nasal cannula for [facility] driver.  IV removed prior to discharge; IV site clean, dry, and intact.  Discharge instructions and follow-up appointment discussed with patient and patient's family.  Patient and family state they have no questions or concerns at this time.  Report called to [place], RN at [facility] prior to patient's discharge.

## 2013-07-12 NOTE — Assessment & Plan Note (Signed)
Type aerosol study during hospitalization confirms mild to moderate dysphagia. Resume ST interventions, monitor fluid intake with thickened fluids. Hopefully his diet can be advanced to the most palatable he desires.

## 2013-07-12 NOTE — Assessment & Plan Note (Signed)
Patient has been oxygen dependent since hospitalization on February 16th. O2 saturation 92% this afternoon with on 2 L Continue to provide continuous supplemental oxygen, attempt to wean as he improves

## 2013-07-12 NOTE — Assessment & Plan Note (Signed)
Appears well compensated today, no sign of volume overload. Continue Lasix 40 mg daily, daily weights as well.

## 2013-07-12 NOTE — Discharge Summary (Signed)
Physician Discharge Summary  [name] ZOX:[id number] DOB: 1911-12-15 DOA: 07/07/2013  PCP: Geoffrey Relic, MD  Admit date: 07/07/2013 Discharge date: 07/12/2013  Time spent: > 35 minutes  Recommendations for Outpatient Follow-up:  1) Recommend pateint follow up with his cardiologist in 1-2 weeks or sooner should any new concerns arise 2) Consider pulmonary function testing moving forward 3) monitor weights daily  Discharge Diagnoses:  Principal Problem:   Acute on chronic respiratory failure with hypoxia Active Problems:   PAF (paroxysmal atrial fibrillation)   Unspecified essential hypertension   Acute on chronic diastolic heart failure   Oropharyngeal dysphagia   SOB (shortness of breath) on exertion   Aspiration, chronic pulmonary   Indeterminate pulmonary nodules   Pleural effusion, bilateral   Discharge Condition: Stable  Diet recommendation: Heart healthy low sodium diet (dysphagia 3 diet)  Filed Weights   07/11/13 0140 07/11/13 0627 07/12/13 0500  Weight: 67.405 kg (148 lb 9.6 oz) 61.3 kg (135 lb 2.3 oz) 61.7 kg (136 lb 0.4 oz)    History of present illness:  Pt is a 80 y/o CM with h/o diastolic chf, gib, cafib, silent aspiration, preserved systolic function EF over 60% per echo last month, HTN.   Hospital Course:  Acute on chronic respiratory failure with hypoxia and bilateral pleural effusions  - Continue lasix PO (home dose). Changed to po on 07/10/13  - Discontinued antibiotics on 2/20 r/t improvement with diuresis and no fever or leukocytosis  - Will discharge patient on home regimen of supplemental oxygen  Acute on chronic diastolic heart failure  - Diuresed with IV lasix initially  - Pt weight down  - Cardiac enzymes neg x3  - BNP 704.7 on 2/19  - ECHO: 60-65% EF. Normal wall motion  - Heart failure education  - On Ace inhibitor, reports that he dose not tolerate Metroprolol and family hesitant to try B blockers at this time   Paroxysmal  atrial fibrillation  - Aspirin daily  - Rate controlled off medication   HTN  - Soft blood pressure  - Continue enalapril  - Discontinued spironolactone and amlodipine on 2/20  - Continue to monitor   Pleural effusions, bilateral  - Improving respiratory status  - see above   Oropharyngeal dysphagia  - Diet Dys 3   Indeterminate pulmonary nodules  - Pt does not want a malignancy work-up   Procedures:  None  Consultations:  None  Discharge Exam: Filed Vitals:   07/12/13 0946  BP: 119/57  Pulse: 78  Temp:   Resp:     General: Pt in NAD, alert and awake Cardiovascular: RRR, no MRG Respiratory: CTA BL, no wheezes  Discharge Instructions  Discharge Orders   Future Appointments Provider Department Dept Phone   07/28/2013 10:00 AM Geoffrey Harbour, NP [facility] [phone]   09/08/2013 11:30 AM Geoffrey Royston Sinner, NP [facility] [phone]   Future Orders Complete By Expires   (HEART FAILURE PATIENTS) Call MD:  Anytime you have any of the following symptoms: 1) 3 pound weight gain in 24 hours or 5 pounds in 1 week 2) shortness of breath, with or without a dry hacking cough 3) swelling in the hands, feet or stomach 4) if you have to sleep on extra pillows at night in order to breathe.  As directed    Call MD for:  difficulty breathing, headache or visual disturbances  As directed    Diet - low sodium heart healthy  As directed  Discharge instructions  As directed    Comments:     Follow up with primary care provider within two weeks.  If you gain over 2lbs in 24 hours take an additional 20 mg lasix (1/2 tab).   Increase activity slowly  As directed        Medication List    STOP taking these medications       amLODipine 5 MG tablet  Commonly known as:  NORVASC     spironolactone 25 MG tablet  Commonly known as:  ALDACTONE      TAKE these medications       aspirin 81 MG tablet  Take 81 mg by mouth 3 (three) times a week.      doxepin 25 MG capsule  Commonly known as:  SINEQUAN  Take 1 capsule (25 mg total) by mouth at bedtime.     enalapril 2.5 MG tablet  Commonly known as:  VASOTEC  Take 2.5 mg by mouth daily.     furosemide 40 MG tablet  Commonly known as:  LASIX  Take 1 tablet (40 mg total) by mouth daily.     polyethylene glycol packet  Commonly known as:  MIRALAX / GLYCOLAX  Take 17 g by mouth daily. Mix in 6 oz beverage in morin ing to relieve constipation     SENOKOT S 8.6-50 MG per tablet  Generic drug:  senna-docusate  Take 2 tablets by mouth. Nightly for stool softener and laxative, as needed     sodium chloride 2 % ophthalmic solution  Commonly known as:  MURO 128  Place 1 drop into the left eye 2 (two) times daily.     tamsulosin 0.4 MG Caps capsule  Commonly known as:  FLOMAX  Take 0.4 mg by mouth daily after breakfast.       Allergies  Allergen Reactions  . Metoprolol     "dementia" "lost touch with reality per Daughter"      The results of significant diagnostics from this hospitalization (including imaging, microbiology, ancillary and laboratory) are listed below for reference.    Significant Diagnostic Studies: Dg Chest Port 1 View  07/08/2013   CLINICAL DATA:  Short of breath  EXAM: PORTABLE CHEST - 1 VIEW  COMPARISON:  CT ANGIO CHEST W/CM &/OR WO/CM dated 05/23/2013; DG CHEST 2 VIEW dated 05/23/2013  FINDINGS: Normal cardiac silhouette. There is interval increase in bilateral pleural effusions seen on prior. There is mild central venous congestion and pulmonary edema at the bases.  IMPRESSION: Increasing bilateral pleural effusions and basilar pulmonary edema   Electronically Signed   By: Genevive BiStewart  Edmunds M.D.   On: 07/08/2013 00:13   Dg Swallowing Func-speech Pathology  07/08/2013   [name], CCC-SLP     07/08/2013 10:56 AM Objective Swallowing Evaluation: Modified Barium Swallowing Study   Patient Details  Name: [name] MRN: [id number] Date of Birth: 05/18/1912   Today's Date: 07/08/2013 Time: [id number] SLP Time Calculation (min): 34 min  Past Medical History:  Past Medical History  Diagnosis Date  . Atrial fibrillation     Not on Coumadin due to recurrent, severe GIB  . H/O: GI bleed   . Inguinal hernia   . Spinal stenosis     S/p surgery   . MI, old [id number]  . Diverticulitis   . BPH (benign prostatic hyperplasia)   . Anemia   . Arthritis   . Blood transfusion   . CHF (congestive heart failure)   . Hypertension   .  Heart attack 1976  . Unspecified hereditary and idiopathic peripheral neuropathy  03/25/2012  . Corns and callosities 12/18/2011  . Pain in joint, ankle and foot 11/20/2011  . Chronic airway obstruction, not elsewhere classified 07/17/2011  . Other dyspnea and respiratory abnormality 07/17/2011  . Other specified cardiac dysrhythmias(427.89) 06/04/2011  . Typhoid fever 05/29/2011  . Macular degeneration (senile) of retina, unspecified 05/29/2011  . Senile cataract, unspecified 05/29/2011  . Pericarditis, acute 05/29/2011  . Acute systolic heart failure 05/29/2011  . Unspecified venous (peripheral) insufficiency 05/29/2011  . Duodenal ulcer 05/29/2011  . Osteoarthrosis, unspecified whether generalized or localized,  pelvic region and thigh 05/29/2011  . Other and unspecified disc disorder of lumbar region 05/29/2011  . Spasm of muscle 05/29/2011  . Abnormality of gait 05/29/2011  . Closed fracture of pubis 05/29/2011  . Transient ischemic attack (TIA), and cerebral infarction  without residual deficits(V12.54) 2013  . Coronary atherosclerosis of native coronary artery 2013  . Pneumonia, organism unspecified 2013  . Unspecified constipation 2013  . Debility, unspecified 05/24/2011  . Unspecified essential hypertension 09/23/2012  . Chronic diastolic heart failure 06/09/2013   Past Surgical History:  Past Surgical History  Procedure Laterality Date  . Spine surgery      Spinal stenosis surgery with Dr. Darrelyn Hillock   . Cholecystectomy    . Total hip arthroplasty  2002    Right   . Vein surgery  2007    leg  .  Inguinal hernia repair  2003    RIH  . Appendectomy    . Tonsillectomy and adenoidectomy     HPI:  80 yo male h/o diastolic chf, gib, cafib, TIAs, silent  aspiration, preserved systolic function EF over 60% per echo last  month, HTN, PUD comes in from short term rehab with one week of  worsening sob especially with exertion.  He was hospitalized last  month with chf exac and pna. At that time was living alone, but  went to short term rehab. He has been in/out rehab since. Since  his d/c last month he has been treated several times for pna in  addition to abx from d/c he has also had a round of avelox and  then augmentin for concerns of aspiration pna. His lasix was also  increased to 40 mg po daily which he has been taking. He does  cough sometimes when he eats, and his liquids have been thickened  recently along with mechanical soft food. He did have a cxr done  within the last week which was compared to his cxr in [place] which  showed pneumonitis vs edema with worsening new bilateral pleural  effusions left >right, and worse edema no focal infiltrate  specifically noted.      Assessment / Plan / Recommendation Clinical Impression  Dysphagia Diagnosis: Moderate pharyngeal phase dysphagia;Mild  oral phase dysphagia Clinical impression: Patient present with a mild oral and a  moderate sensorimotor pharyngeal dysphagia. Oral delays noted  with soft solids only however transit remains functional.  Combination of delay in swallow initiation, base of tongue  weakness, and decreased hyo-laryngeal excursion result in  decreased airway protection with thin liquids despite therapuetic  intervention for various head postures and a supraglottic  swallow.  Additionally, cues for hard throat clear unreliable in  clearance of laryngeal vestibule. Patient with improved airway  protection with solids and trials of nectar thick liquids however  presence of moderate-severe vallecular residuals, minimizing with  multiple dry swallows,   do increase risk.  Educated patient  regarding above. Patient receptive to use of thickened liquids to  mitigate risks at this time. SLP will f/u at bedside for diet  tolerance and potential for advancement. Suspect some degree of  chronic dysphagia based on current function and advanced age,  however may note some improvement with clearance of infection and  re-conditioning.      Treatment Recommendation  Therapy as outlined in treatment plan below    Diet Recommendation Dysphagia 3 (Mechanical Soft);Nectar-thick  liquid   Liquid Administration via: Cup;No straw Medication Administration: Crushed with puree Supervision: Patient able to self feed;Full supervision/cueing  for compensatory strategies Compensations: Slow rate;Small sips/bites;Multiple dry swallows  after each bite/sip Postural Changes and/or Swallow Maneuvers: Seated upright 90  degrees;Upright 30-60 min after meal    Other  Recommendations Oral Care Recommendations: Oral care BID Other Recommendations: Order thickener from pharmacy;Prohibited  food (jello, ice cream, thin soups);Remove water pitcher   Follow Up Recommendations  Skilled Nursing facility    Frequency and Duration min 2x/week  2 weeks        General HPI: 80 yo male h/o diastolic chf, gib, cafib, TIAs,  silent aspiration, preserved systolic function EF over 60% per  echo last month, HTN, PUD comes in from short term rehab with one  week of worsening sob especially with exertion.  He was  hospitalized last month with chf exac and pna. At that time was  living alone, but went to short term rehab. He has been in/out  rehab since. Since his d/c last month he has been treated several  times for pna in addition to abx from d/c he has also had a round  of avelox and then augmentin for concerns of aspiration pna. His  lasix was also increased to 40 mg po daily which he has been  taking. He does cough sometimes when he eats, and his liquids  have been thickened recently along with mechanical soft  food. He  did have a cxr done within the last week which was compared to  his cxr in [place] which showed pneumonitis vs edema with worsening  new bilateral pleural effusions left >right, and worse edema no  focal infiltrate specifically noted.  Type of Study: Modified Barium Swallowing Study Reason for Referral: Objectively evaluate swallowing function Previous Swallow Assessment: evaluated at [facility] per patient  and placed on thickened liquids, unable to specify however  suspect honey thick Diet Prior to this Study: Dysphagia 2 (chopped);Honey-thick  liquids Temperature Spikes Noted: No Respiratory Status: Nasal cannula History of Recent Intubation: No Behavior/Cognition: Alert;Cooperative;Pleasant mood Oral Cavity - Dentition: Adequate natural dentition Oral Motor / Sensory Function: Impaired motor (mild lingual  discoordination) Self-Feeding Abilities: Able to feed self Patient Positioning: Upright in chair Baseline Vocal Quality: Clear Volitional Cough: Strong Volitional Swallow: Able to elicit Anatomy: Within functional limits Pharyngeal Secretions: Not observed secondary MBS    Reason for Referral Objectively evaluate swallowing function   Oral Phase Oral Preparation/Oral Phase Oral Phase: WFL (except mild oral transit delays with soft  solids)   Pharyngeal Phase Pharyngeal Phase Pharyngeal Phase: Impaired Pharyngeal - Nectar Pharyngeal - Nectar Teaspoon: Delayed swallow  initiation;Premature spillage to valleculae;Pharyngeal residue -  valleculae;Pharyngeal residue - pyriform sinuses;Reduced tongue  base retraction;Reduced anterior laryngeal mobility Pharyngeal - Nectar Cup: Delayed swallow initiation;Premature  spillage to valleculae;Pharyngeal residue - valleculae;Pharyngeal  residue - pyriform sinuses;Reduced tongue base retraction;Reduced  anterior laryngeal mobility Pharyngeal - Thin Pharyngeal - Thin Teaspoon: Delayed swallow initiation;Pharyngeal  residue - valleculae;Pharyngeal  residue - pyriform   sinuses;Reduced tongue base retraction;Reduced anterior laryngeal  mobility;Premature spillage to pyriform sinuses;Reduced  airway/laryngeal closure;Penetration/Aspiration before  swallow;Penetration/Aspiration during swallow (chin tuck, head  turn left/right unsuccessful) Penetration/Aspiration details (thin teaspoon): Material enters  airway, CONTACTS cords and not ejected out Pharyngeal - Thin Cup: Delayed swallow initiation;Pharyngeal  residue - valleculae;Pharyngeal residue - pyriform  sinuses;Reduced tongue base retraction;Reduced anterior laryngeal  mobility;Premature spillage to pyriform sinuses;Reduced  airway/laryngeal closure;Penetration/Aspiration during swallow  (supraglottic swallow unsuccessful) Penetration/Aspiration details (thin cup): Material enters  airway, CONTACTS cords and not ejected out Pharyngeal - Solids Pharyngeal - Puree: Delayed swallow initiation;Premature spillage  to valleculae;Pharyngeal residue - valleculae;Reduced tongue base  retraction;Reduced anterior laryngeal mobility Pharyngeal - Mechanical Soft: Delayed swallow  initiation;Premature spillage to valleculae;Pharyngeal residue -  valleculae;Reduced tongue base retraction;Reduced anterior  laryngeal mobility  Cervical Esophageal Phase    GO   Ferdinand Lango MA, CCC-SLP [phone]  Cervical Esophageal Phase Cervical Esophageal Phase: [facility]         Ali Leah Meryl 07/08/2013, 10:56 AM     Microbiology: Recent Results (from the past 240 hour(s))  CULTURE, BLOOD (ROUTINE X 2)     Status: None   Collection Time    07/08/13  2:28 AM      Result Value Ref Range Status   Specimen Description BLOOD RIGHT ARM   Final   Special Requests     Final   Value: BOTTLES DRAWN AEROBIC AND ANAEROBIC RED 7CC, BLUE 10CC   Culture  Setup Time     Final   Value: 07/08/2013 08:14     Performed at [facility]   Culture     Final   Value:        BLOOD CULTURE RECEIVED NO GROWTH TO DATE CULTURE WILL BE HELD FOR 5 DAYS BEFORE ISSUING  A FINAL NEGATIVE REPORT     Performed at [facility]   Report Status PENDING   Incomplete  CULTURE, BLOOD (ROUTINE X 2)     Status: None   Collection Time    07/08/13  2:35 AM      Result Value Ref Range Status   Specimen Description BLOOD RIGHT HAND   Final   Special Requests BOTTLES DRAWN AEROBIC ONLY 10 CC   Final   Culture  Setup Time     Final   Value: 07/08/2013 08:14     Performed at [facility]   Culture     Final   Value:        BLOOD CULTURE RECEIVED NO GROWTH TO DATE CULTURE WILL BE HELD FOR 5 DAYS BEFORE ISSUING A FINAL NEGATIVE REPORT     Performed at [facility]   Report Status PENDING   Incomplete  MRSA PCR SCREENING     Status: None   Collection Time    07/08/13  5:39 AM      Result Value Ref Range Status   MRSA by PCR NEGATIVE  NEGATIVE Final   Comment:            The GeneXpert MRSA Assay (FDA     approved for NASAL specimens     only), is one component of a     comprehensive MRSA colonization     surveillance program. It is not     intended to diagnose MRSA     infection nor to guide or     monitor treatment for     MRSA infections.     Labs: Basic  Metabolic Panel:  Recent Labs Lab 07/07/13 2356 07/08/13 0555 07/10/13 0533  NA 132* 132* 132*  K 4.5 4.3 4.5  CL 94* 93* 92*  CO2 27 25 27   GLUCOSE 100* 105* 82  BUN 29* 28* 31*  CREATININE 1.13 1.11 1.14  CALCIUM 8.8 8.8 9.0   Liver Function Tests: No results found for this basename: AST, ALT, ALKPHOS, BILITOT, PROT, ALBUMIN,  in the last 168 hours No results found for this basename: LIPASE, AMYLASE,  in the last 168 hours No results found for this basename: AMMONIA,  in the last 168 hours CBC:  Recent Labs Lab 07/07/13 2356 07/08/13 0555 07/10/13 0533  WBC 5.3 5.0 5.2  NEUTROABS 3.7  --   --   HGB 12.3* 12.9* 13.1  HCT 34.7* 36.7* 36.9*  MCV 95.6 96.6 95.6  PLT 161 165 165   Cardiac Enzymes:  Recent Labs Lab 07/08/13 0555 07/08/13 1058 07/08/13 1932   TROPONINI <0.30 <0.30 <0.30   BNP: BNP (last 3 results)  Recent Labs  05/23/13 1245 07/08/13 0021  PROBNP 1995.0* 704.7*   CBG: No results found for this basename: GLUCAP,  in the last 168 hours     Signed:  Penny Pia  [facility] 07/12/2013, 2:37 PM

## 2013-07-13 ENCOUNTER — Ambulatory Visit: Payer: Medicare Other | Admitting: Cardiology

## 2013-07-13 ENCOUNTER — Other Ambulatory Visit: Payer: Medicare Other

## 2013-07-14 LAB — CULTURE, BLOOD (ROUTINE X 2)
Culture: NO GROWTH
Culture: NO GROWTH

## 2013-07-15 ENCOUNTER — Non-Acute Institutional Stay (SKILLED_NURSING_FACILITY): Payer: Medicare Other | Admitting: Geriatric Medicine

## 2013-07-15 ENCOUNTER — Encounter: Payer: Self-pay | Admitting: Geriatric Medicine

## 2013-07-15 DIAGNOSIS — J90 Pleural effusion, not elsewhere classified: Secondary | ICD-10-CM

## 2013-07-15 DIAGNOSIS — R1312 Dysphagia, oropharyngeal phase: Secondary | ICD-10-CM

## 2013-07-15 DIAGNOSIS — I5033 Acute on chronic diastolic (congestive) heart failure: Secondary | ICD-10-CM

## 2013-07-15 NOTE — Assessment & Plan Note (Signed)
Remains O2 dependent, repeat CXR pending

## 2013-07-15 NOTE — Assessment & Plan Note (Signed)
ST interventions resumed, continues puree diet/ NTL, fair intake, nutritional supplement has been added.

## 2013-07-15 NOTE — Assessment & Plan Note (Signed)
No sign of volume overload, weight has decreased several pounds since admission. Consider reducing diuretic to avoid him becoming too dry.

## 2013-07-15 NOTE — Progress Notes (Signed)
Patient ID: [name], male   DOB: 18-Aug-1911, 80 y.o.   MRN: [id number]   [facility] (31)  Code Status: DNR     Contact Information   Name Relation Home Work [employer]. [name] [phone]  [phone]   Cheek,[name] Daughter [phone]  [phone]   [name] [phone]  [phone]   [facility] Significant other [phone]     Zacharius, Tam [phone]  [phone]       Chief Complaint  Patient presents with  . heart failure  . Hypoxemia    HPI: This is a 80 y.o. male resident of [facility],  Independent Living section with recurrent problem of SOB/hypoxia due to diastolic heart failure and bilateral pleural efffusions. He was discharged from hospital 07/12/2013.    Last visit: Acute on chronic diastolic heart failure Appears well compensated today, no sign of volume overload. Continue Lasix 40 mg daily, daily weights as well.  Pleural effusion, bilateral Etiology unclear; heart failure vs. post infection vs. malignancy. Will consider thoracentesis if breathing does not improve.  Oropharyngeal dysphagia Modified Ba swallow study during hospitalization confirms mild to moderate oro pharyngeal dysphagia. Resume ST interventions, monitor fluid intake with thickened fluids. Hopefully his diet can be advanced to the most palatable he desires.   Indeterminate pulmonary nodules Pulmonologist pulmonary nodules noted on chest imaging from prior hospitalization. Patient does not wish to proceed with workup at this time. If respiratory status does not improve will revisit workup with thoracentesis  SOB (shortness of breath) Patient has been oxygen dependent since hospitalization on February 16th. O2 saturation 92% this afternoon with on 2 L Continue to provide continuous supplemental oxygen, attempt to wean as he improves   Since last visit, the patient's vital signs remain stable,  he is working with physical occupational and speech therapies, is eating adequately, fluid intake appears low. He continues to be oxygen dependent; attempts to wean from supplement oxygen result in decreased saturation within a few minutes. He is resting in dosing frequently. Continues to require assist with ADLs, is ambulating in the hallway with assistance/ supervision. Lab and repeat CXR scheduled today have been delayed to due inclement weather.    Allergies  Allergen Reactions  . Metoprolol     "dementia" "lost touch with reality per Daughter"    MEDICATIONS -  Reviewed   DATA REVIEWED  Radiologic Exams 07/07/2013 PORTABLE CHEST - 1 VIEW   COMPARISON:  CT ANGIO CHEST W/CM &/OR WO/CM dated 05/23/2013; DG CHEST 2 VIEW dated 05/23/2013   FINDINGS: Normal cardiac silhouette. There is interval increase in bilateral pleural effusions seen on prior. There is mild central venous congestion and pulmonary edema at the bases.   IMPRESSION: Increasing bilateral pleural effusions and basilar pulmonary edema   Cardiovascular Exams:   Laboratory Studies: Lab Results  Component Value Date   WBC 5.2 07/10/2013   HGB 13.1 07/10/2013   HCT 36.9* 07/10/2013   MCV 95.6 07/10/2013   PLT 165 07/10/2013   Lab Results  Component Value Date   NA 132* 07/10/2013   K 4.5 07/10/2013   GLU 83 06/22/2013   BUN 31* 07/10/2013   CREATININE 1.14 07/10/2013   Lab Results  Component Value Date   CALCIUM 9.0 07/10/2013   ALBUMIN 2.9* 05/15/2011   AST 112* 05/15/2011   ALT 77* 05/15/2011   ALKPHOS 72 05/15/2011   BILITOT 1.8* 05/15/2011   GFRNONAA 51* 07/10/2013   GFRAA 59* 07/10/2013   No results  found for this basename: vitaminb12,  vitamind   .  REVIEW OF SYSTEMS  DATA OBTAINED: from patient, nurse, medical record,  GENERAL: Feels tired. Appetite fair, afebrile SKIN: No itch, rash   EYES: No eye pain, dryness or itching  No change in vision EARS: No earache, change in hearing NOSE: No congestion,  drainage or bleeding MOUTH/THROAT: No mouth or tooth pain  No sore throat   No difficulty chewing or swallowing RESPIRATORY: No cough, wheezing. SOB present CARDIAC: No chest pain, palpitations  No edema. GI: No abdominal pain  No nausea, vomiting,diarrhea or constipation  No heartburn or reflux  GU: No dysuria, frequency or urgency  No change in urine volume or character  MUSCULOSKELETAL: No joint pain, swelling or stiffness  No back pain. Generalized muscle weakness No muscle ache, pain,  NEUROLOGIC: No dizziness, fainting, headache  No change in mental status.  PSYCHIATRIC: No feelings of anxiety, depression  Sleeps well.  No behavior issue.    PHYSICAL EXAM Filed Vitals:   07/15/13 1536  BP: 118/67  Pulse: 72  Temp: 96.9 F (36.1 C)  Resp: 18  Weight: 127 lb 6.4 oz (57.788 kg)  SpO2: 96%   Body mass index is 17.78 kg/(m^2).  GENERAL APPEARANCE: No acute distress, appropriately groomed, Frail body habitus. Sleeping in chair, awakens easily,  pleasant, conversant. SKIN: No diaphoresis, rash, wound left lower leg with thick scab HEAD: Normocephalic, atraumatic EYES: Conjunctiva/lids clear. Pupils round, reactive.  EARS: External exam WNL , poor hearing  NOSE: No deformity or discharge. MOUTH/THROAT: Lips w/o lesions. Oral mucosa, tongue moist w/o lesion. Oropharynx w/o redness or lesions.  NECK: Supple, full ROM. No thyroid tenderness, enlargement or nodule LYMPHATICS: No head, neck or supraclavicular adenopathy RESPIRATORY: Breathing is even, unlabored. Lung sounds remain significantly decreased at bases bilaterally  CARDIOVASCULAR: Heart RRR. No murmur or extra heart sounds  ARTERIAL: No carotid, bruit.   EDEMA: No peripheral edema. No ascites GASTROINTESTINAL: Abdomen is soft, non-tender, not distended w/ normal bowel sounds. No hepatic or splenic enlargement. MUSCULOSKELETAL: Moves all extremities with full ROM, strength and tone. Back is without kyphosis, scoliosis or  spinal process tenderness.  NEUROLOGIC: Oriented to time, place, person. Cranial nerves 2-12 grossly intact, speech clear, no tremor.  PSYCHIATRIC: Mood and affect appropriate to situation   ASSESSMENT/PLAN  Acute on chronic diastolic heart failure No sign of volume overload, weight has decreased several pounds since admission. Consider reducing diuretic to avoid him becoming too dry.  Pleural effusion, bilateral Remains O2 dependent, repeat CXR pending  Oropharyngeal dysphagia ST interventions resumed, continues puree diet/ NTL, fair intake, nutritional supplement has been added.    Family/ staff Communication:     Labs/tests ordered: 07/15/2012, BMP, BNP. Recheck repeat chest x-ray - results pending   Follow up: As needed  Toribio HarbourClaudette T.Rozella Servello, NP-C [facility] [phone]  07/15/2013

## 2013-07-16 LAB — BASIC METABOLIC PANEL
BUN: 35 mg/dL — AB (ref 4–21)
CREATININE: 1.1 mg/dL (ref 0.6–1.3)
GLUCOSE: 131 mg/dL
Potassium: 4.4 mmol/L (ref 3.4–5.3)
Sodium: 138 mmol/L (ref 137–147)

## 2013-07-20 ENCOUNTER — Non-Acute Institutional Stay (SKILLED_NURSING_FACILITY): Payer: Medicare Other | Admitting: Geriatric Medicine

## 2013-07-20 ENCOUNTER — Encounter: Payer: Self-pay | Admitting: Geriatric Medicine

## 2013-07-20 DIAGNOSIS — R5381 Other malaise: Secondary | ICD-10-CM

## 2013-07-20 DIAGNOSIS — I5033 Acute on chronic diastolic (congestive) heart failure: Secondary | ICD-10-CM

## 2013-07-20 DIAGNOSIS — J90 Pleural effusion, not elsewhere classified: Secondary | ICD-10-CM

## 2013-07-20 DIAGNOSIS — Z7189 Other specified counseling: Secondary | ICD-10-CM

## 2013-07-20 NOTE — Progress Notes (Signed)
Patient ID: CAROLD BRYS, male   DOB: 1912-02-03, 80 y.o.   MRN: [id number]   Patient ID: DANNE HEMPLE, male   DOB: 1911/10/30, 80 y.o.   MRN: [id number]   [facility] (31)  Code Status: DNR Contact Information   Name Relation Home Work [employer]. [name] [phone]  [phone]   Cheek,[name] Daughter [phone]  [phone]   [name] [phone]  [phone]   [facility] Significant other [phone]     Roddrick, Rutigliano [phone]  [phone]       Chief Complaint  Patient presents with  . Pleural Effusion  . Diastolic heart faillure  . Debility    HPI: This is a 80 y.o. male resident of [facility],  Independent Living section with recurrent problem of SOB/hypoxia due to diastolic heart failure and bilateral pleural efffusions. He was discharged from hospital 07/12/2013.    Last visit: Acute on chronic diastolic heart failure No sign of volume overload, weight has decreased several pounds since admission. Consider reducing diuretic to avoid him becoming too dry.  Pleural effusion, bilateral Remains O2 dependent, repeat CXR pending  Oropharyngeal dysphagia ST interventions resumed, continues puree diet/ NTL, fair intake, nutritional supplement has been added.   Since last visit patient notes he fatigues easily but continues to participate in OT and PT. He is ambulating in the hall with a walker. Denies shortness of breath with ambulation. P.o. intake is poor to fair. The patient tells me he is "tired", is not sure he is going to get well enough to return to his home. This is okay with him, "It's OK if I don't make it to my 89th birthday". Wants to continue working with therapies and other interventions here in the facility, does not want to return to the hospital. Daughter [name] is present for the visit today, she agrees. Most recent labs and chest x-ray without significant  change.   Allergies  Allergen Reactions  . Metoprolol     "dementia" "lost touch with reality per Daughter"    MEDICATIONS -  Reviewed   DATA REVIEWED  Radiologic Exams 07/07/2013 PORTABLE CHEST - 1 VIEW   COMPARISON:  CT ANGIO CHEST W/CM &/OR WO/CM dated 05/23/2013; DG CHEST 2 VIEW dated 05/23/2013    IMPRESSION: Increasing bilateral pleural effusions and basilar pulmonary edema   [facility] x-ray 07/16/2013 Chest, one view: No cardiomegaly. Mild pulmonary vascular congestion improved. Bilateral pleural effusions small in size, unchanged. Patchy pneumonia or edema described in the prior exam of the mid and lower lungs bilateral overall improved  Cardiovascular Exams:   Laboratory Studies: Lab Results  Component Value Date   WBC 5.2 07/10/2013   HGB 13.1 07/10/2013   HCT 36.9* 07/10/2013   MCV 95.6 07/10/2013   PLT 165 07/10/2013   Lab Results  Component Value Date   NA 132* 07/10/2013   K 4.5 07/10/2013   GLU 83 06/22/2013   BUN 31* 07/10/2013   CREATININE 1.14 07/10/2013      Lab Results  Component Value Date   NA 138 07/16/2013   K 4.4 07/16/2013   GLU 131 07/16/2013   BUN 35* 07/16/2013   CREATININE 1.1 07/16/2013    REVIEW OF SYSTEMS  DATA OBTAINED: from patient, daughter, nurse, medical record,  GENERAL: Feels tired. Appetite fair to poor, afebrile SKIN: No itch, rash   EYES: No eye pain, dryness or itching  No change in vision EARS: No earache, change in hearing NOSE: No congestion,  drainage or bleeding MOUTH/THROAT: No mouth or tooth pain  No sore throat   No difficulty chewing or swallowing RESPIRATORY: No cough, wheezing. Denies SOB CARDIAC: No chest pain, palpitations  No edema. GI: No abdominal pain  No nausea, vomiting,diarrhea or constipation  No heartburn or reflux  GU: No dysuria, frequency or urgency  No change in urine volume or character  MUSCULOSKELETAL: No joint pain, swelling or stiffness  No back pain. Generalized muscle weakness No muscle ache,  pain,  NEUROLOGIC: No dizziness, fainting, headache  No change in mental status.  PSYCHIATRIC: No feelings of anxiety, depression  Sleeps well.  No behavior issue.    PHYSICAL EXAM Filed Vitals:   07/20/13 1227  BP: 122/61  Pulse: 74  Temp: 96.3 F (35.7 C)  Resp: 18  Weight: 128 lb 3.2 oz (58.151 kg)  SpO2: 92%   Body mass index is 17.89 kg/(m^2).  GENERAL APPEARANCE: No acute distress, appropriately groomed, Frail body habitus. Alert, pleasant, conversant. SKIN: No diaphoresis, rash, wound left lower leg with thick scab HEAD: Normocephalic, atraumatic EYES: Conjunctiva/lids clear. Pupils round, reactive.  EARS: External exam WNL , poor hearing  NOSE: No deformity or discharge. MOUTH/THROAT: Lips w/o lesions. Oral mucosa, tongue moist w/o lesion. Oropharynx w/o redness or lesions.  NECK: Supple, full ROM. No thyroid tenderness, enlargement or nodule LYMPHATICS: No head, neck or supraclavicular adenopathy RESPIRATORY: Breathing is even, unlabored. Lung sounds remain decreased at left base, O2 Sat 86% RA while conversing, improve with supplemental O2 2L [place] CARDIOVASCULAR: Heart RRR. No murmur or extra heart sounds   EDEMA: No peripheral edema.  NEUROLOGIC: Oriented to time, place, person.Speech clear, no tremor.  PSYCHIATRIC: Mood and affect appropriate to situation   ASSESSMENT/PLAN  Acute on chronic diastolic heart failure No sign of volume overload, effusions are unchanged on x-ray, small. Patient's weight remains low. Decrease diuretic  Pleural effusion, bilateral Effusions are small bilaterally, breath sounds are decreased on the left. He does remain oxygen dependent.  Debility Patient with significant deconditioning due to  Heart failure, pleural effusions, advanced age. He is willing to continue working with PT and OT to maximize his functional status but is willing to accept him he not improve very much at this point.   Advanced care planning/counseling  discussion Discussion today with the patient and his daughter. He knows his general condition is weak and is not sure he will be able to improve very much. He is going to continue to work with physical /occupational therapy and nursing staff to maximize his functional status however he does feel he may not be able to return to his independent living home. He wishes for us to continue all interventions we can here in the facility, does not want to return to the hospital.   Family/ staff Communication:  See above  Time: 30minutes, >50% spent counseling/or care coordination   Labs/tests ordered:    Follow up: As needed  Toribio HarbourClaudette T.Khoury Siemon, NP-C [facility] [phone]  07/20/2013

## 2013-07-21 ENCOUNTER — Encounter: Payer: Medicare Other | Admitting: Geriatric Medicine

## 2013-07-22 DIAGNOSIS — Z7189 Other specified counseling: Secondary | ICD-10-CM | POA: Insufficient documentation

## 2013-07-22 NOTE — Assessment & Plan Note (Signed)
Patient with significant deconditioning due to  Heart failure, pleural effusions, advanced age. He is willing to continue working with PT and OT to maximize his functional status but is willing to accept him he not improve very much at this point.

## 2013-07-22 NOTE — Assessment & Plan Note (Signed)
Discussion today with the patient and his daughter. He knows his general condition is weak and is not sure he will be able to improve very much. He is going to continue to work with physical /occupational therapy and nursing staff to maximize his functional status however he does feel he may not be able to return to his independent living home. He wishes for [place] to continue all interventions we can here in the facility, does not want to return to the hospital.

## 2013-07-22 NOTE — Assessment & Plan Note (Signed)
Effusions are small bilaterally, breath sounds are decreased on the left. He does remain oxygen dependent.

## 2013-07-22 NOTE — Assessment & Plan Note (Signed)
No sign of volume overload, effusions are unchanged on x-ray, small. Patient's weight remains low. Decrease diuretic

## 2013-07-26 ENCOUNTER — Encounter: Payer: Self-pay | Admitting: Internal Medicine

## 2013-07-27 ENCOUNTER — Non-Acute Institutional Stay (SKILLED_NURSING_FACILITY): Payer: Medicare Other | Admitting: Geriatric Medicine

## 2013-07-27 ENCOUNTER — Encounter: Payer: Self-pay | Admitting: Geriatric Medicine

## 2013-07-27 DIAGNOSIS — Z7189 Other specified counseling: Secondary | ICD-10-CM

## 2013-07-27 DIAGNOSIS — I5033 Acute on chronic diastolic (congestive) heart failure: Secondary | ICD-10-CM

## 2013-07-27 NOTE — Progress Notes (Signed)
Patient ID: [name], male   DOB: 1911/10/15, 80 y.o.   MRN: [id number]   [facility] (31)  Code Status: DNR Contact Information   Name Relation Home Work [employer]. [name] [phone]  [phone]   [name] Daughter [phone]  [phone]   [name] [phone]  [phone]   [facility] Significant other [phone]     [name] [phone]  [phone]       Chief Complaint  Patient presents with  . Diastolic Heart failure  . Debility  . Hypoxia    HPI: This is a 80 y.o. male resident of [facility],  Independent Living section with recurrent problem of SOB/hypoxia due to diastolic heart failure and bilateral pleural efffusions. He was discharged from hospital 07/12/2013.    Last visit:  Acute on chronic diastolic heart failure No sign of volume overload, effusions are unchanged on x-ray, small. Patient's weight remains low. Decrease diuretic  Pleural effusion, bilateral Effusions are small bilaterally, breath sounds are decreased on the left. He does remain oxygen dependent.  Debility Patient with significant deconditioning due to  Heart failure, pleural effusions, advanced age. He is willing to continue working with PT and OT to maximize his functional status but is willing to accept him he not improve very much at this point.   Advanced care planning/counseling discussion Discussion today with the patient and his daughter. He knows his general condition is weak and is not sure he will be able to improve very much. He is going to continue to work with physical /occupational therapy and nursing staff to maximize his functional status however he does feel he may not be able to return to his independent living home. He wishes for [place] to continue all interventions we can here in the facility, does not want to return to the hospital.   Since last visit patient's overall  condition has not changed significantly. He continues to be very weak, tires easily, with any activity. His appetite remains very poor, weight remains low. Patient was evaluated yesterday by Dr. Chilton Si who recommended starting Megace to help appetite. Patient continues to have difficulty managing food and fluid; he is having coughing and choking with even thickened liquids and some soft foods. Discussion with patient today regarding his overall status. He is very focused on quality of life, feels his current situation represents very poor quality. He does not wish to  have any interventions that will prolong his life. He defines good quality of life as being able to be up and around independently, living in his home independently free to come and go as he pleases, eat what he wants without limitation or difficulty. He expresses desire to just let his wife play out as it will, "I am neary 77 years old you know".  Does not want to initiate treatment with Megace. He does feel that working with the physical therapist does quality to his life.  Allergies  Allergen Reactions  . Metoprolol     "dementia" "lost touch with reality per Daughter"    MEDICATIONS -  Reviewed   DATA REVIEWED  Radiologic Exams 07/07/2013 PORTABLE CHEST - 1 VIEW   COMPARISON:  CT ANGIO CHEST W/CM &/OR WO/CM dated 05/23/2013; DG CHEST 2 VIEW dated 05/23/2013    IMPRESSION: Increasing bilateral pleural effusions and basilar pulmonary edema   [facility] x-ray 07/16/2013 Chest, one view: No cardiomegaly. Mild pulmonary vascular congestion improved. Bilateral pleural effusions small in size, unchanged. Patchy pneumonia  or edema described in the prior exam of the mid and lower lungs bilateral overall improved  Cardiovascular Exams:   Laboratory Studies: Lab Results  Component Value Date   WBC 5.2 07/10/2013   HGB 13.1 07/10/2013   HCT 36.9* 07/10/2013   MCV 95.6 07/10/2013   PLT 165 07/10/2013   Lab Results  Component Value  Date   NA 132* 07/10/2013   K 4.5 07/10/2013   GLU 83 06/22/2013   BUN 31* 07/10/2013   CREATININE 1.14 07/10/2013      Lab Results  Component Value Date   NA 138 07/16/2013   K 4.4 07/16/2013   GLU 131 07/16/2013   BUN 35* 07/16/2013   CREATININE 1.1 07/16/2013    REVIEW OF SYSTEMS  DATA OBTAINED: from patient, nurse, medical record,  GENERAL: Feels tired after even limited activity. Appetite fair to poor, afebrile SKIN: No itch, rash   EYES: No eye pain, dryness or itching  No change in vision EARS: No earache, change in hearing NOSE: No congestion, drainage or bleeding MOUTH/THROAT: No mouth or tooth pain  No sore throat   No difficulty chewing or swallowing RESPIRATORY: No cough, wheezing. Denies SOB CARDIAC: No chest pain, palpitations  No edema. GI: No abdominal pain  No nausea, vomiting,diarrhea or constipation  No heartburn or reflux  GU: No dysuria, frequency or urgency  No change in urine volume or character  MUSCULOSKELETAL: No joint pain, swelling or stiffness  No back pain. Generalized muscle weakness No muscle ache, pain,  NEUROLOGIC: No dizziness, fainting, headache  No change in mental status.  PSYCHIATRIC: No feelings of anxiety, depression  Sleeps well.  No behavior issue.    PHYSICAL EXAM Filed Vitals:   07/27/13 1448  BP: 113/79  Pulse: 107  Temp: 97 F (36.1 C)  Resp: 22  Weight: 125 lb 3.2 oz (56.79 kg)  SpO2: 95%   Body mass index is 17.47 kg/(m^2).  GENERAL APPEARANCE: No acute distress, appropriately groomed, Frail body habitus. Alert, pleasant, conversant. SKIN: No diaphoresis, rash, wound left lower leg with thick scab HEAD: Normocephalic, atraumatic EYES: Conjunctiva/lids clear. Pupils round, reactive.  EARS: External exam WNL , poor hearing  NOSE: No deformity or discharge. MOUTH/THROAT: Lips w/o lesions. Oral mucosa, tongue moist w/o lesion. Oropharynx w/o redness or lesions.  NECK: Supple, full ROM. No thyroid tenderness, enlargement or  nodule LYMPHATICS: No head, neck or supraclavicular adenopathy RESPIRATORY: Breathing is even, unlabored. Lung sounds remain decreased at left base, O2 Sat 86% RA while conversing, improve with supplemental O2 2L [place] CARDIOVASCULAR: Heart RRR. No murmur or extra heart sounds   EDEMA: No peripheral edema.  NEUROLOGIC: Oriented to time, place, person.Speech clear, no tremor.  PSYCHIATRIC: Mood and affect appropriate to situation   ASSESSMENT/PLAN  Acute on chronic diastolic heart failure No change in patient's volume status with decreased dose of diuretic. Activity status is limited, PPS 40%, NYHA stage 3-4. His main symptoms are fatigue and hypoxia. Patient's weight is low, his p.o. intake is poor. If patient continues on his present course, he is likely nearing end-of-life; life expectancy in terms of days to weeks.   Advanced care planning/counseling discussion Extensive discussion today with patient/son Merlyn AlbertFred and separate discussion with all 3 of patient's adult children. All are in agreement to pursue comfort measures, no life prolonging treatments. Patient would like to continue physical therapy as he feels this adds quality to his daily life.    Family/ staff Communication:  See above  Time:  90 minutes, >50% spent counseling/or care coordination   Labs/tests ordered:    Follow up: As needed  Toribio Harbour, NP-C [facility] [phone]  07/27/2013

## 2013-07-28 ENCOUNTER — Encounter: Payer: Medicare Other | Admitting: Geriatric Medicine

## 2013-07-30 ENCOUNTER — Other Ambulatory Visit: Payer: Self-pay | Admitting: Geriatric Medicine

## 2013-07-30 NOTE — Assessment & Plan Note (Signed)
Extensive discussion today with patient/son [name] and separate discussion with all 3 of patient's adult children. All are in agreement to pursue comfort measures, no life prolonging treatments. Patient would like to continue physical therapy as he feels this adds quality to his daily life.

## 2013-07-30 NOTE — Assessment & Plan Note (Signed)
No change in patient's volume status with decreased dose of diuretic. Activity status is limited, PPS 40%, NYHA stage 3-4. His main symptoms are fatigue and hypoxia. Patient's weight is low, his p.o. intake is poor. If patient continues on his present course, he is likely nearing end-of-life; life expectancy in terms of days to weeks.

## 2013-08-02 ENCOUNTER — Non-Acute Institutional Stay (SKILLED_NURSING_FACILITY): Payer: Medicare Other | Admitting: Geriatric Medicine

## 2013-08-02 ENCOUNTER — Encounter: Payer: Self-pay | Admitting: Geriatric Medicine

## 2013-08-02 DIAGNOSIS — I5033 Acute on chronic diastolic (congestive) heart failure: Secondary | ICD-10-CM

## 2013-08-02 DIAGNOSIS — R221 Localized swelling, mass and lump, neck: Secondary | ICD-10-CM

## 2013-08-02 DIAGNOSIS — R609 Edema, unspecified: Secondary | ICD-10-CM

## 2013-08-02 DIAGNOSIS — R220 Localized swelling, mass and lump, head: Secondary | ICD-10-CM

## 2013-08-02 MED ORDER — MORPHINE SULFATE (CONCENTRATE) 20 MG/ML PO SOLN: 10.0000 mg | ORAL | Status: AC | PRN

## 2013-08-02 MED ORDER — LORAZEPAM 2 MG/ML PO CONC: 0.5000 mg | Freq: Three times a day (TID) | ORAL | Status: AC

## 2013-08-18 NOTE — Assessment & Plan Note (Signed)
Swelling and tenderness in the area of the right parotid gland most likely due to obstruction. There are signs of infection as well. This patient is in his life, will continue IM antibiotic to help reduce discomfort from this problem. Provide frequent mouth care as he can tolerate

## 2013-08-18 NOTE — Progress Notes (Signed)
Patient ID: [name], male   DOB: [birth year], 80 y.o.   MRN: [id number]   [facility] [id number])   Contact Information   Name Relation Home Work [name]   [name] Ali [phone]  [phone]   Cheek,[name] [phone]  [phone]   [name] [phone]  [phone]   [name] [phone]     [name] Ali [phone]  [phone]       Chief Complaint  Patient presents with  . mandible swelling    HPI: This is a 80 y.o. male resident of [name] Skilled Nursing section.  Evaluation is requested today in followup of right mandibular swelling. On March 14 patient woke up with a ping-pong ball size lump above the right mandibular angle. It was tender, nurse applied warm compresses. Patient was noted to have more difficulty swallowing. He was given doses of morphine for the pain management. Overnight the swelling increased, pain increased as well. The On-Call provider was notified who recommended treatment with IV Rocephin. Nurse had conversation with the patient regarding the use of an antibiotic to improve his comfort noting this medication will not prolong his life. He agreed to start treatment with IM Rocephin. Patient was significantly agitated and uncomfortable earlier this morning, he received both liquid morphine and lorazepam, he is now resting very comfortably. No p.o. intake for the last 48 hours.     Allergies  Allergen Reactions  . Metoprolol     "dementia" "lost touch with reality per Ali"    MEDICATIONS -  reviewed, medication list simplified   DATA REVIEWED  Radiologic Exams:   Laboratory Studies:   REVIEW OF SYSTEMS  DATA OBTAINED: from  nurse, medical record, caregiver GENERAL: Last 2 days either agitated or sleeping, no p.o. intake last 2 days RESPIRATORY: No cough, wheezing Low O2 saturation  CARDIAC: No sign of chest pain. No  edema GI: No abdominal pain  No vomiting,diarrhea or constipation  NEUROLOGIC: Decreased LOC  PSYCHIATRIC: Intermittent agitation  PHYSICAL EXAM Filed Vitals:   02/07/14 1151  BP: 90/65  Pulse: 110  Resp: 18   There is no weight on file to calculate BMI.  GENERAL APPEARANCE: No acute distress, frail, cachectic body habitus, minimally responsive  SKIN: No diaphoresis HEAD: Normocephalic.  Right side of face is red, with a large mass centered on the right mandible.  Very limited exam due to  pain with any palpation   MOUTH: Lips are dry, evidence of dried oral secretions in the mouth. RESPIRATORY: Breathing is even, unlabored  Lung sounds are clear CARDIOVASCULAR: Heart RRR   No murmur or extra heart sounds   EDEMA: No peripheral edema   NEUROLOGIC: Decreased level of consciousness   ASSESSMENT/PLAN  Parotid swelling Swelling and tenderness in the area of the right parotid gland most likely due to obstruction. There are signs of infection as well. This patient is in his life, will continue IM antibiotic to help reduce discomfort from this problem. Provide frequent mouth care as he can tolerate  Acute on chronic diastolic heart failure Patient with further decline in over the last several days, no p.o. intake in the last 48 hours, little to no activity. This further decline is a combination of heart failure and new problem of probable right parotid infection/abscess.  Will minimize patient's medications to those that will provide comfort only. End of life likely to occur in hours to days.    Family/ staff Communication:  Discussed with nurse End of  Life situation, encouraged frequent monitoring and use of medication to promote comfort. Reminded that patient has been very clear in his desire not to prolong his life.   Time: 30 minutes, >50% spent counseling/or care coordination   Labs/tests ordered:   Follow up: Return for As needed.  Toribio Harbour, NP-C [facility] [phone]  2013/08/20

## 2013-08-18 NOTE — Assessment & Plan Note (Addendum)
Patient with further decline in over the last several days, no p.o. intake in the last 48 hours, little to no activity. This further decline is a combination of heart failure and new problem of probable right parotid infection/abscess.  Will minimize patient's medications to those that will provide comfort only. End of life likely to occur in hours to days.

## 2013-08-18 DEATH — deceased

## 2013-09-08 ENCOUNTER — Encounter: Payer: Self-pay | Admitting: Geriatric Medicine

## 2018-05-15 NOTE — Progress Notes (Signed)
This encounter was created in error - please disregard.
